# Patient Record
Sex: Female | Born: 1935 | Race: White | Hispanic: No | Marital: Married | Smoking: Never smoker
Health system: Southern US, Community
[De-identification: ages and names within clinical notes are randomized; demographics above are authoritative.]

## PROBLEM LIST (undated history)

## (undated) DIAGNOSIS — I10 Essential (primary) hypertension: Secondary | ICD-10-CM

## (undated) DIAGNOSIS — E119 Type 2 diabetes mellitus without complications: Secondary | ICD-10-CM

## (undated) DIAGNOSIS — M1990 Unspecified osteoarthritis, unspecified site: Secondary | ICD-10-CM

## (undated) DIAGNOSIS — K219 Gastro-esophageal reflux disease without esophagitis: Secondary | ICD-10-CM

## (undated) DIAGNOSIS — C50919 Malignant neoplasm of unspecified site of unspecified female breast: Secondary | ICD-10-CM

## (undated) DIAGNOSIS — IMO0002 Reserved for concepts with insufficient information to code with codable children: Secondary | ICD-10-CM

## (undated) HISTORY — PX: EYE SURGERY: SHX253

## (undated) HISTORY — PX: JOINT REPLACEMENT: SHX530

## (undated) HISTORY — DX: Type 2 diabetes mellitus without complications: E11.9

## (undated) HISTORY — PX: FOOT ARTHRODESIS, MODIFIED MCBRIDE: SUR52

## (undated) HISTORY — PX: OTHER SURGICAL HISTORY: SHX169

## (undated) HISTORY — PX: FRACTURE SURGERY: SHX138

## (undated) HISTORY — DX: Reserved for concepts with insufficient information to code with codable children: IMO0002

## (undated) HISTORY — DX: Unspecified osteoarthritis, unspecified site: M19.90

## (undated) HISTORY — DX: Essential (primary) hypertension: I10

## (undated) HISTORY — PX: APPENDECTOMY: SHX54

## (undated) HISTORY — DX: Malignant neoplasm of unspecified site of unspecified female breast: C50.919

---

## 1991-05-27 HISTORY — PX: BREAST SURGERY: SHX581

## 1995-05-27 HISTORY — PX: BREAST SURGERY: SHX581

## 1995-08-04 DIAGNOSIS — C50919 Malignant neoplasm of unspecified site of unspecified female breast: Secondary | ICD-10-CM

## 1995-08-04 HISTORY — DX: Malignant neoplasm of unspecified site of unspecified female breast: C50.919

## 1996-05-26 HISTORY — PX: PARTIAL HIP ARTHROPLASTY: SHX733

## 1996-05-26 HISTORY — PX: BREAST SURGERY: SHX581

## 1997-09-04 ENCOUNTER — Other Ambulatory Visit: Admission: RE | Admit: 1997-09-04 | Discharge: 1997-09-04 | Payer: Self-pay | Admitting: Oncology

## 1998-03-02 ENCOUNTER — Other Ambulatory Visit: Admission: RE | Admit: 1998-03-02 | Discharge: 1998-03-02 | Payer: Self-pay | Admitting: Internal Medicine

## 1998-07-02 ENCOUNTER — Encounter: Admission: RE | Admit: 1998-07-02 | Discharge: 1998-09-30 | Payer: Self-pay | Admitting: Internal Medicine

## 1999-06-05 ENCOUNTER — Other Ambulatory Visit: Admission: RE | Admit: 1999-06-05 | Discharge: 1999-06-05 | Payer: Self-pay | Admitting: Internal Medicine

## 2000-09-16 ENCOUNTER — Other Ambulatory Visit: Admission: RE | Admit: 2000-09-16 | Discharge: 2000-09-16 | Payer: Self-pay | Admitting: Internal Medicine

## 2004-03-04 ENCOUNTER — Inpatient Hospital Stay (HOSPITAL_COMMUNITY): Admission: RE | Admit: 2004-03-04 | Discharge: 2004-03-08 | Payer: Self-pay | Admitting: Orthopedic Surgery

## 2004-03-08 ENCOUNTER — Inpatient Hospital Stay
Admission: RE | Admit: 2004-03-08 | Discharge: 2004-03-14 | Payer: Self-pay | Admitting: Physical Medicine & Rehabilitation

## 2004-03-08 ENCOUNTER — Ambulatory Visit: Payer: Self-pay | Admitting: Physical Medicine & Rehabilitation

## 2006-06-09 ENCOUNTER — Emergency Department (HOSPITAL_COMMUNITY): Admission: EM | Admit: 2006-06-09 | Discharge: 2006-06-09 | Payer: Self-pay | Admitting: Emergency Medicine

## 2006-06-16 ENCOUNTER — Encounter: Admission: RE | Admit: 2006-06-16 | Discharge: 2006-06-16 | Payer: Self-pay | Admitting: Orthopaedic Surgery

## 2006-06-17 ENCOUNTER — Ambulatory Visit (HOSPITAL_BASED_OUTPATIENT_CLINIC_OR_DEPARTMENT_OTHER): Admission: RE | Admit: 2006-06-17 | Discharge: 2006-06-17 | Payer: Self-pay | Admitting: Orthopaedic Surgery

## 2007-04-05 ENCOUNTER — Encounter: Admission: RE | Admit: 2007-04-05 | Discharge: 2007-04-05 | Payer: Self-pay | Admitting: Gastroenterology

## 2009-02-18 ENCOUNTER — Emergency Department (HOSPITAL_COMMUNITY): Admission: EM | Admit: 2009-02-18 | Discharge: 2009-02-18 | Payer: Self-pay | Admitting: Family Medicine

## 2010-10-11 NOTE — Op Note (Signed)
NAMEDARTHULA, DESA NO.:  [id number]   MEDICAL RECORD NO.:  [id number]          PATIENT TYPE:  INP   LOCATION:  5025                         FACILITY:  [facility]   PHYSICIAN:  Mila Homer. Sherlean Foot, M.D. DATE OF BIRTH:  10/15/1929   DATE OF PROCEDURE:  03/04/2004  DATE OF DISCHARGE:                                 OPERATIVE REPORT   PREOPERATIVE DIAGNOSIS:  Right knee osteoarthritis.   POSTOPERATIVE DIAGNOSIS:  Right knee osteoarthritis.   OPERATION PERFORMED:  Right total knee arthroplasty.   SURGEON:  Mila Homer. Sherlean Foot, M.D.   ASSISTANT:  Jamelle Rushing, P.A.   ANESTHESIA:  General.   INDICATIONS FOR PROCEDURE:  The patient is a 77 year old white female with  failure of conservative measures for osteoarthritis of the knee.  Informed  consent was obtained.   DESCRIPTION OF PROCEDURE:  The patient was laid supine and administered  general anesthesia.  The right lower extremity was prepped and draped in the  usual sterile fashion.  A #10 blade was used to make a standard midline  incision approximately 10 cm in length.  A new 10 blade was used to make a  straight medial and parapatellar arthrotomy and perform a synovectomy in the  suprapatellar pouch.  A fresh 10 blade was used to elevate the deep medial  collateral ligament off the medial crest of the tibia really only going  about 2 cm distally since this was a valgus knee.  I then easily everted the  patella.  It measured 23 mm thick.  I then measured the patella at 24 mm.  I  reamed down to 15.5 mm.  I then used a 32 mm template to drill three lug  holes.  Then with a prosthetic trial in place, I recreated the 25 mm thick  patella.  I left the knee cap everted and removed the prosthetic trial and  went into flexion.  I cut the ACL and PCL and subluxed the tibia anteriorly.  I then used the extramedullary alignment device to remove a perpendicular  cut of bone perpendicular to the anatomic axis of the tibia  removing 2 mm of  bone off the lateral side since it was the low side.  I removed the cut  surface of the bone as well as well as the extramedullary guide.  I made an  intramedullary drill hole in the femur, placed the intramedullary device set  on 4 degree valgus cut.  I then placed the distal femoral cutting block in  place, pinned it and made the distal femoral cut with a sagittal saw.  I  then placed a 10 mm spacer block in the knee.  It was a little tight  laterally, so I did a little bit of lateral releasing to gain medial and  lateral balance.  I then placed a scoop retractor underneath the femur,  finished the femur with a size D finishing block.  I finished the tibia with  a size 4 tibial tray.  I put a lamina spreader in the knee to remove the  ACL, PCL, medial and lateral menisci  and posterior condylar osteophytes.  At  this point I trialed with a size D femur, size 4 tibia, size 10 insert and a  size 32 patella.  I then removed the trial components and copiously  irrigated.  At this point I cemented in a size 4 tibia first, D femur second  and removed all excess cement.  I snapped in a 10 mm spacer block and  located the knee in extension.  I then cemented in the size 32 patella and  let the cement harden in extension.  I then let the tourniquet down at 44  minutes and cauterized all bleeding vessels.  I then left the Hemovac coming  out superolaterally deep to the arthrotomy.  I closed the arthrotomy with  interrupted #1 Vicryl sutures.  Closed the deep soft tissues with  interrupted 0 Vicryls, then the subcuticular with 2-0 Vicryl.  I then closed  with skin staples.  I dressed with Xeroform dressing sponges, sterile Webril  and TED stocking.   COMPLICATIONS:  None.   DRAINS:  One Hemovac.   ESTIMATED BLOOD LOSS:  300 mL.       SDL/MEDQ  D:  03/04/2004  T:  03/05/2004  Job:  [id number]

## 2010-10-11 NOTE — Op Note (Signed)
NAME:  Diane Stevenson, Diane Stevenson NO.:  [id number]   MEDICAL RECORD NO.:  [id number]          PATIENT TYPE:  EMS   LOCATION:  MAJO                         FACILITY:  [facility]   PHYSICIAN:  Dyke Brackett, M.D.    DATE OF BIRTH:  03-Sep-1929   DATE OF PROCEDURE:  06/09/2006  DATE OF DISCHARGE:                               OPERATIVE REPORT   INDICATIONS FOR PROCEDURE:  The patient is a 77 year old female who  fell, injuring her left wrist, with a severely-displaced comminuted  intra-articular fracture with a nondisplaced avulsion of her patella,  thought to be amenable to closed reduction.   DESCRIPTION OF PROCEDURE:  After IV sedation, conscious sedation with  oxygen and monitoring, hematoma block, the fracture was manipulated with  a great deal of improvement on the post-reduction film, noting a slight  shortening, but anatomic restoration of the articular cartilage line on  the AP and a great deal of improvement on the lateral.  It was not clear  this would suffice permanently.  This was a great deal improved.  The  patient was advised to ice and elevate, weightbearing as tolerated in a  knee immobilizer for her patellar fracture, with a followup visit to be  scheduled early next week.  Given Percocet 5 mg 1 p.o. q. 4-6 hours for  pain.      Dyke Brackett, M.D.  Electronically Signed     WDC/MEDQ  D:  06/09/2006  T:  06/10/2006  Job:  [id number]

## 2010-10-11 NOTE — Discharge Summary (Signed)
Diane Stevenson, Diane Stevenson NO.:  [id number]   MEDICAL RECORD NO.:  [id number]          PATIENT TYPE:  ORB   LOCATION:  4526                         FACILITY:  [facility]   PHYSICIAN:  Erick Colace, M.D.DATE OF BIRTH:  1930-01-22   DATE OF ADMISSION:  03/08/2004  DATE OF DISCHARGE:  03/14/2004                                 DISCHARGE SUMMARY   DISCHARGE DIAGNOSES:  1.  Right total knee replacement.  2.  Presyncope symptoms, resolved.  3.  Hypertension, controlled off medications.  4.  Postoperative anemia.   HISTORY OF PRESENT ILLNESS:  Diane Stevenson is a 77 year old female with a  history of hypertension, diabetes mellitus type 2, osteoarthritis right knee  with collapse of lateral compartment and right knee pain.  She elected to  undergo right total knee replacement March 04, 2004, by Dr. Sherlean Foot.  Postoperatively is weightbearing as tolerated and on subcutaneous Lovenox  for DVT prophylaxis.  She was noted to have presyncopal episodes past  surgery and was transferred two units of packed red blood cells for  postoperative anemia.  She does continue with dizziness and blood pressure  medications were placed on hold.  Therapies were initiated and the patient  currently at minimal to moderate assist for transfers, minimal to moderate  assist to ambulate 5 to 12 feet.   PAST MEDICAL HISTORY:  1.  Hypertension.  2.  Diabetes mellitus type 2.  3.  Appendectomy.  4.  Left foot bunion surgery.  5.  Breast cyst.  6.  Left modified radical mastectomy for cancer.  7.  Left total hip replacement.   ALLERGIES:  1.  SULFA.  2.  MACRODANTIN.   FAMILY HISTORY:  Positive for coronary artery disease and diabetes mellitus.   SOCIAL HISTORY:  The patient is married.  Lives in one level home with six  steps at entry.  Was independent prior to admission.  She does not use any  tobacco or alcohol.   HOSPITAL COURSE:  Diane Stevenson was admitted to subacute on March 08, 2004, for SACU level therapies to consist of PT and OT daily. Past  admission, she was maintained on subcutaneous Lovenox for DVT prophylaxis  and supplement was continued for postoperative anemia.  Follow-up CBC  revealed H&H to be stable with hemoglobin 8.8, hematocrit 25.6, white count  6.0, platelets 377.  Check of lytes reveal sodium 136, potassium 4.0,  chloride 101, CO2 29, BUN 7, creatinine 0.7, glucose 143.  The patient's  UA/UC checked past admission showed no growth.  The patient's blood pressure  medications were held throughout his stay secondary to problems with  dizziness with mobility initially, and abdominal bind and thigh high TEDs  were ordered to help with blood pressure support.  At the time of discharge,  blood pressures were running from 130s/70s systolic with heart rates in 90s  to 105 range.  Patient without any cardiac symptoms.  CBGs were monitored on  a.c. and h.s. basis with decrease to b.i.d. basis and blood sugars were  noted to be stable between 110s and occasional high in the  160s.  Weight at  the time of discharge is at 155 pounds.  The patient's knee incision was  noted to be healing well without any signs or symptoms of infection.  No  drainage and no erythema noted.  Staples remained intact at the time of  discharge with the patient to have home health nurse to discontinue staples  on March 18, 2004.  The patient continues on subcutaneous Lovenox for 14  days total, DVT prophylaxis past discharge.  At the time of discharge the  patient was noted to be at modified independent level for transfers,  modified independent level for ambulating 100 feet with rolling walker.  She  was at modified independent level for ADLs.  Further follow-up therapies to  include home health PT and OT per [facility] with home  health RN to assist with Lovenox administration.  On March 14, 2004, the  patient is discharged to home.   DISCHARGE  MEDICATIONS:  1.  Glucophage 1000 mg b.i.d.  2.  Avandia 4 mg a day.  3.  Trinsicon one p.o. b.i.d.  4.  Oxycodone IR 5 to 10 mg q.4-6h. p.r.n. pain.  5.  Calcium supplements t.i.d.  6.  Lovenox 40 mg subcutaneous once a day for five days.  7.  Enteric coated aspirin 325 mg a day for one month then discontinue to 81      mg a day.   ACTIVITY:  Use walker.   DIET:  Diabetic restrictions.  Drink plenty of fluids.   WOUND CARE:  Wash with soap and water.  Keep clean and dry.   SPECIAL INSTRUCTIONS:  No alcohol.  No smoking.  No driving.  Do not use  Norvasc, Altace or hydrochlorothiazide for now.   FOLLOW UP:  The patient to follow up with Dr. Kirby Funk for recheck of  blood pressure, to follow up with Dr. Sherlean Foot for postoperative check, to  follow up with Dr. Wynn Banker as needed.      Diane Stevenson   PP/MEDQ  D:  05/06/2004  T:  05/06/2004  Job:  [id number]   cc:   Thora Lance, M.D.  [address]  [place]  [place] [address]  Fax: [phone]   Mila Homer. Sherlean Foot, M.D.  [address] [place]  [place] [address]  Fax: [phone]

## 2010-10-11 NOTE — Discharge Summary (Signed)
Diane Stevenson, Diane Stevenson NO.:  [id number]   MEDICAL RECORD NO.:  [id number]          PATIENT TYPE:  INP   LOCATION:  5025                         FACILITY:  [facility]   PHYSICIAN:  Mila Homer. Sherlean Foot, M.D. DATE OF BIRTH:  01-Jul-1929   DATE OF ADMISSION:  03/04/2004  DATE OF DISCHARGE:  03/08/2004                                 DISCHARGE SUMMARY   ADMITTING DIAGNOSES:  1.  End-stage osteoarthritis right knee with lateral compartment collapse.  2.  Hypertension.  3.  Diabetes mellitus.  4.  History of left total hip arthroplasty.  5.  History of breast cancer status post left partial mastectomy.   DISCHARGE DIAGNOSES:  1.  End-stage osteoarthritis right knee status post right total knee      arthroplasty.  2.  Acute blood loss anemia secondary to surgery.  3.  Postoperative nausea now resolved.  4.  Mild hypokalemia.  5.  Mild hyponatremia.  6.  Mild orthostatic hypotension now resolved.  7.  Hypertension.  8.  Diabetes mellitus.  9.  History of breast cancer status post left partial mastectomy.   SURGICAL PROCEDURE:  On March 04, 2004 Diane Stevenson underwent a right total  knee arthroplasty by Dr. Mila Homer. Lucey assisted by Arlyn Leak, P.A.-C.  She had a NexGen Legacy knee LPS Flex articular surface size CD 10 mm height  with a NexGen all poly patella, standard size 32 mm diameter 8.5 mm  thickness.  A NexGen fluted stem tibial component size 4 with a NexGen  Legacy knee LPS Flex femoral component size D right.   COMPLICATIONS:  None.   CONSULTS:  1.  Physical therapy and occupational therapy consults March 06, 2004  2.  Rehabilitation medicine consult March 07, 2004 in addition to case      management consult   HISTORY OF PRESENT ILLNESS:  This 77 year old white female patient presented  to Dr. Sherlean Foot with right knee pain for the last two to three years.  The knee  now feels like it gives way and pops.  It awakens her at night.  It is an  aching  sensation over the lateral aspect of the knee.  It is worse with  walking and decreases with rest.  She has failed conservative treatment and  x-rays show arthritic changes of the knee.  Because of this she is  presenting for a right knee replacement.   HOSPITAL COURSE:  Diane Stevenson tolerated her surgical procedure well without  immediate postoperative complications.  She was transferred to 5000.  Postoperative day one she was having some nausea.  Medications were  adjusted.  She is afebrile, vitals stable.  Hemoglobin 9.2, hematocrit 25.8,  and leg neurovascularly intact.  She was started on therapy.   Postoperative day two she had difficulty with being a little dizzy with out  of bed.  Hemoglobin had dropped to 8.3 with hematocrit of 23.3 and she was  subsequently transfused with 2 units of packed red blood cells.  T-max was  101.2.  Vitals otherwise stable.  Right leg neurovascularly intact.  Potassium was 3 and that was  supplemented with potassium.   She made good slow progress over the next several days.  She did better  after her transfusion.  She still had some residual dizziness on the 13th  but that improved on the 14th.  She had some mild orthostatic changes when  out of bed with therapy.  It was felt she would benefit from a course of  rehabilitation and an on March 08, 2004 a rehabilitation bed became  available and she was able to be transferred there later that day.   DISCHARGE INSTRUCTIONS:   DIET:  She can continue her current hospitalization diet with adjustments to  be made per the rehabilitation physicians.   MEDICATIONS:  Continue her current hospitalization medications with  adjustments to be made per the rehabilitation physicians.   ACTIVITY:  She is to be out of bed weightbearing as tolerated on the right  leg with use of a walker.  She is to use CPM 0-100 degrees six to eight  hours a day.  Continue Pt and OT per rehabilitation protocols.   WOUND CARE:   Please clean the right knee incision with Betadine once a day  and apply a dry dressing.  Staples can be removed with Steri-Strips with  Benzoin applied on postoperative day 14 if she is still in [place] or  rehabilitation.  If not, she needs to follow up with Dr. Sherlean Foot at that time.  Please notify Dr. Sherlean Foot if temperature greater than 101.5, chills, pain  unrelieved by pain medications, or foul smelling drainage from the wound.   FOLLOWUP:  She needs to follow up with Dr. Sherlean Foot in our office approximately  postoperative day 14 if she is out of rehabilitation at that time and  staples are still in place.  Otherwise, a week or two after discharge from  rehabilitation she can follow up with Dr. Sherlean Foot.  She needs to call [phone]  for that appointment.   LABORATORY DATA:  On October 11 hemoglobin 9.2, hematocrit 25.8.  On the  12th white count 11.2, hemoglobin 8.3, hematocrit 23.3.  On October 13 white  count 10.3, hemoglobin 10.2, hematocrit 28, platelets 198.   On October 5 glucose 109.  On the 11th sodium 128, potassium 3.7, glucose  124, calcium 7.9.  On the 12th sodium 126, potassium 3, chloride 94, glucose  140, calcium 8.2.  On the 13th sodium 130, potassium 3.8, chloride 94,  glucose 167.  On October 14 sodium 134, potassium 3.8, chloride 101, CO2 28,  glucose 117, BUN 7, creatinine 0.8, calcium 8.1.  Urinalysis showed small  amount of leukocyte esterase, rare epithelials, 0-2 white cells, no red  cells.  All other laboratory studies were within normal limits.       KED/MEDQ  D:  04/01/2004  T:  04/01/2004  Job:  [id number]   cc:   Thora Lance, M.D.  [address]  [place]  [place] [address]  Fax: [phone]

## 2010-10-11 NOTE — H&P (Signed)
Diane Stevenson, Diane Stevenson NO.:  [id number]   MEDICAL RECORD NO.:  [id number]          PATIENT TYPE:  INP   LOCATION:  NA                           FACILITY:  [facility]   PHYSICIAN:  Mila Homer. Sherlean Foot, M.D. DATE OF BIRTH:  16-Jun-1929   DATE OF ADMISSION:  DATE OF DISCHARGE:                                HISTORY & PHYSICAL   CHIEF COMPLAINT:  Right knee pain.   HISTORY OF PRESENT ILLNESS:  [name] is a 77 year old white female with  right knee pain for approximately two to three years.  Her pain, however,  became progressively worse over the past four months.  She now has the  sensation of knee giving way and also painful popping.  The knee pain does  awaken her at night.  The pain is described as an achy pain in the lateral  aspect of her knee that causes her nausea.  There is no radiation of the  pain up or down the leg.  She uses no assisted devices to ambulate.  Her  pain is worse with walking.  It is also aggravated with going up or down  stairs.  Her pain is better with rest.  She currently takes Tylenol which  helps eliminate her pain; however, it is does not completely alleviate her  pain.  She has taken Celebrex in the past; however, this causes her nausea  and vomiting.  She has had no injections by Dr. Mila Homer. Lucey.  However,  she has had injections by her primary care physician in the past and these  gave her relief for a number of years.   X-rays of her left knee reveal total collapse of the lateral compartment  with valgus deformity.   ALLERGIES:  Questionable AZO GANTRISIN, questionable MACROBID capsules,  SULFA which causes hives.   CURRENT MEDICATIONS:  1.  HCTZ 25 mg 1/2 tablet q.d.  2.  Aspirin 81 mg 1 q.d.  3.  Altace 10 mg 1q.d.  4.  Avandia 4 mg 1 q.d.  5.  Norvasc 5 mg 1 q.d.  6.  Metformin 1000 mg 1 b.i.d.  7.  Calcium plus D 500 mg b.i.d.   PAST MEDICAL HISTORY:  Positive for diabetes mellitus and hypertension.   PAST SURGICAL  HISTORY:  1.  Appendectomy in 1950.  2.  Left foot bunion removal.  3.  History of cyst left breast 1991 and right breast 1994.  4.  Left partial mastectomy in 1970 to breast cancer.  5.  Total hip replacement by Dr. Jerolyn Shin. [place]ender in 1998.  The patient      describes no complications except for nausea and vomiting of the above      procedures.  She required no blood transfusions.   SOCIAL HISTORY:  The patient denies any tobacco or alcohol use.  She is  married and has one grown children.  The patient lives in a one-story home  with six steps to each entrance.   PRIMARY CARE PHYSICIAN:  Thora Lance, M.D.  Phone number is [phone].   FAMILY HISTORY:  The patient's mother  is deceased at age 10 due to heart  conditions.  Otherwise health unknown.  Father deceased at approximate age  71.  He had a history of pneumonia, otherwise health history unknown.  She  has one living brother who has diabetes mellitus and is age 36.  She has  three living sisters-age 77, 62, and 60.  Unsure of their health problems.  She has four deceased brothers-one due to liver disease Adenosine-Cardiolite  the other three due to MIs.  She has one deceased sister-demise is unknown  and age at which death occurred unknown.   REVIEW OF SYMPTOMS:  The patient denies any recent cold, cough, fever, or  flu-like symptoms.  She denies any chest pain, shortness of breath, PND, or  orthopnea.  She denies any GI or GU conditions.  She denies any hepatic  hematologic conditions.  She wears glasses.   PHYSICAL EXAMINATION:  GENERAL:  The patient is a well-developed, well-  nourished female.  She has an obvious valgus deformity of the left knee.  The patient mood and affect are appropriate.  She talks easily with the  examiner.  VITAL SIGNS:  The patient's height is 5 foot 5 inches.  Weight 156 pounds.  Temperature is 96.8, pulse 70, respiratory rate 16, blood pressure 132/72.  CARDIOVASCULAR:  Regular rate and  rhythm.  No murmurs, rubs, or gallops  noted.  LUNGS:  Clear to auscultation bilaterally without rales or wheezing.  ABDOMEN:  Nontender to palpation.  Bowel sounds x 4 quadrants.  No  hepatosplenomegaly or splenomegaly is noted.  HEENT:  Normocephalic and atraumatic without frontal or maxillary sinus  tenderness to palpation.  Conjunctivae are pink.  Sclerae has nonicteric.  PERRLA.  Extraocular movements intact.  There are no visible external ear  deformities.  TMs are pearly and gray bilaterally.  Nose shows nasal septum  is midline.  Nasal mucosa is pink and moist without polyps.  Buccal mucosa  is pink and moist.  Good dentition.  Pharynx without erythema or exudate.  Tongue and uvula is midline.  NECK:  Trachea is midline.  No lymphadenopathy.  Carotids are 2+ without  bruits.  No tenderness along the cervical spine.  She has full range of  motion of the cervical spine without pain.  BACK:  Nontender to palpation over the thoracic lumbar spine.  BREASTS:  All deferred at this time.  GENITOURINARY:  All deferred at this time.  RECTAL:  All deferred at this time.  NEUROLOGICAL:  The patient is alert and oriented x 3.  Cranial nerves II  through XII are grossly intact.  Deep tendon reflexes are 2+ bilaterally in  the upper and lower extremities.  Strength testing in the upper and lower  extremities revealed 5/5 strength throughout.  MUSCULOSKELETAL:  Left arm is slightly edematous compared to the right,  especially in the shoulder and humerus region.  Otherwise, the arms are  equal and symmetric in size and shape.  She has full range of motion of the  shoulders, elbows, and hands.  Right wrist shows a slight deformity along  the ulnar aspect due to a previous fracture.  Radial pulses are 2+  bilaterally.  Motor extremities are equal in size and shape.  Bilateral hips  show full range of motion.  Right knee full flexion and extension which measures 0 to 130 degrees of flexion.  There  is 7 degree of valgus deformity  of the knee.  Has tenderness with palpation of the medial compartment,  otherwise the knee is nontender.  There is no definite fusion.  No edema.  Anterior draws are negative.  Negative left knee at 0 to 130 degrees. Valgus  varus stressing reveals no laxity.  Anterior draw is negative.  There is no  evidence of fusion or edema.  Lower extremities are nonedematous  bilaterally.  Dorsalis pedis pulses are 2+ bilaterally.  She has full  dorsiflexion and plantar flexion of the feet.  She has good sensation to  light touch in the toes.   LABORATORY DATA:  X-rays show four views of the right knee reveal total  collapse of the lateral compartment with a valgus deformity.   IMPRESSION:  1.  Osteoarthritis right knee with lateral compartment collapse.  2.  History of left total hip arthroplasty October of 1998.  3.  Hypertension.  4.  Diabetes mellitus.   PLAN:  The patient is to be admitted to [facility] on  March 04, 2004 to undergo a right total knee arthroplasty.  The patient  will undergo all preoperative testing and labs prior to surgery.       GC/MEDQ  D:  02/26/2004  T:  02/26/2004  Job:  [id number]

## 2010-10-11 NOTE — Op Note (Signed)
NAMENUSRAT, ENCARNACION NO.:  [id number]   MEDICAL RECORD NO.:  [id number]          PATIENT TYPE:  AMB   LOCATION:  DSC                          FACILITY:  [facility]   PHYSICIAN:  Claude Manges. Whitfield, M.D.DATE OF BIRTH:  12-02-1929   DATE OF PROCEDURE:  06/17/2006  DATE OF DISCHARGE:                               OPERATIVE REPORT   PREOPERATIVE DIAGNOSIS:  Displaced left distal radius fracture.   POSTOPERATIVE DIAGNOSIS:  Displaced left distal radius fracture.   PROCEDURE:  Open reduction internal fixation.   SURGEON:  Claude Manges. Cleophas Dunker, M.D.   ASSISTANT:  Arlys John D. Petrarca, P.A.-C.   ANESTHESIA:  Regional block with IV sedation.   COMPLICATIONS:  None.   HISTORY:  A 77 year old female fell 8 days ago sustaining a distal  radius fracture.  This was initially reduced in the emergency room with  nice reduction and position of the fragments.  Over the past week, she  has had significant shortening and displacement of the fracture and is  now to have an open reduction internal fixation.   PROCEDURE:  The patient comfortable on the operating room under IV  sedation.  A tourniquet was applied to the left upper extremity.  The  arm was then prepped with DuraPrep from the tips of the fingers to the  elbow.  Sterile draping was performed.  With the extremity still  elevated, it was Esmarch exsanguinated with the proximal tourniquet at  250 mmHg.  The patient did have preoperative regional block by  Anesthesia.   An incision was outlined along the flexor carpi radialis tendon crossing  the wrist joint at a 60-degree angle.  The 8-cm incision was then  carefully incised sharply and then by blunt dissection, soft tissue was  elevated off the palmar sheath of the flexor carpi radialis.  This was  carefully incised.  The tendon was retracted ulnarly.  The base of the  sheath was then opened.  Tendinous structures were then carefully  retracted ulnarly, so that we could  visualize the pronator quadratus.  This was incised along the watershed line of the distal radius radially  and then along the first extensor compartment and then elevated ulnarly,  thus exposing the distal radius.  It was then irrigated.  The radius was  fractured approximately 2 cm proximal to the wrist joint.  It was  shortened and there was comminution.  Under direct visualization, the  fracture was then reduced with traction and ulnar deviation.  The [employer] plate was then applied.  An oblong hole was then drilled and  filled with a 14-mm screw.  The plate was still a little ulnar, so we  repositioned it with excellent position and the 14 screw was too long so  we inserted a 12.  The distal fragment was then maintained to the distal  plate with a K wire.  We carefully drilled, measured, and filled the  proximal row with threaded screws and checked on image intensification  to be sure that we were not in the joint and that we were not too long.  In similar  fashion, the distal row were then drilled, measured, and  filled with the smooth pegs.  Image intensification revealed excellent  position of the fracture and the internal fixation devices without  position into the joint or too long dorsally.  The remaining holes in  the plate proximally were then drilled, measured, and filled with self-  tapping screws and again we checked the final position with excellent  position.  Clinically there was no motion at the fracture site.  We  established length and angulation of the distal radius.   The wound was then copiously irrigated with saline solution.  The  pronator quadratus was then reapproximated with 3-0 Vicryl, subcutaneous  with the same material, and the skin closed with nylon.  Sterile bulky  dressing was applied, followed by posterior splints and a bulky hand  dressing.  Tourniquet was released with immediate capillary refill to  the fingers.   PLAN:  Office one week.   Did receive a gram of Ancef preoperatively.  We  will plan on giving her prescription for Vicodin for pain.      Claude Manges. Cleophas Dunker, M.D.  Electronically Signed     PWW/MEDQ  D:  06/17/2006  T:  06/17/2006  Job:  [id number]

## 2010-10-23 ENCOUNTER — Encounter (INDEPENDENT_AMBULATORY_CARE_PROVIDER_SITE_OTHER): Payer: Self-pay | Admitting: General Surgery

## 2011-07-22 ENCOUNTER — Encounter (INDEPENDENT_AMBULATORY_CARE_PROVIDER_SITE_OTHER): Payer: Self-pay | Admitting: General Surgery

## 2011-07-23 ENCOUNTER — Encounter (INDEPENDENT_AMBULATORY_CARE_PROVIDER_SITE_OTHER): Payer: Self-pay | Admitting: General Surgery

## 2011-07-23 ENCOUNTER — Ambulatory Visit (INDEPENDENT_AMBULATORY_CARE_PROVIDER_SITE_OTHER): Payer: Medicare Other | Admitting: General Surgery

## 2011-07-23 VITALS — BP 130/70 | HR 68 | Temp 96.9°F | Resp 16 | Ht 65.0 in | Wt 153.2 lb

## 2011-07-23 DIAGNOSIS — Z853 Personal history of malignant neoplasm of breast: Secondary | ICD-10-CM | POA: Insufficient documentation

## 2011-07-23 NOTE — Patient Instructions (Signed)
Continue regular self exams  

## 2011-07-23 NOTE — Progress Notes (Signed)
Subjective:     Patient ID: Diane Stevenson, female   DOB: 12-15-1929, 78 y.o.   MRN: [id number]  HPI The patient is a 78 year old white female who is 15 years out from a left breast lumpectomy and axillary node dissection for a stage III breast cancer. She finished chemoradiation. Her recent mammogram showed no evidence of malignancy. Since her last visit her only complaint is right hip pain. She denies any breast pain or discharge from her nipple  Review of Systems  Constitutional: Negative.   HENT: Negative.   Eyes: Negative.   Respiratory: Negative.   Cardiovascular: Negative.   Gastrointestinal: Negative.   Genitourinary: Negative.   Musculoskeletal: Positive for arthralgias.  Skin: Negative.   Neurological: Negative.   Hematological: Negative.   Psychiatric/Behavioral: Negative.        Objective:   Physical Exam  Constitutional: She is oriented to person, place, and time. She appears well-developed and well-nourished.  HENT:  Head: Normocephalic and atraumatic.  Eyes: Conjunctivae and EOM are normal. Pupils are equal, round, and reactive to light.  Neck: Normal range of motion. Neck supple.  Cardiovascular: Normal rate, regular rhythm and normal heart sounds.   Pulmonary/Chest: Effort normal and breath sounds normal.       No palpable mass in either breast. No axillary supraclavicular or cervical lymphadenopathy  Abdominal: Soft. Bowel sounds are normal. She exhibits no mass. There is no tenderness.  Musculoskeletal: Normal range of motion.  Lymphadenopathy:    She has no cervical adenopathy.  Neurological: She is alert and oriented to person, place, and time.  Skin: Skin is warm and dry.  Psychiatric: She has a normal mood and affect. Her behavior is normal.       Assessment:     15 years status post left breast lumpectomy and axillary node dissection    Plan:     She will continue to do regular self exams. We will plan to see her back in one year

## 2011-07-24 ENCOUNTER — Encounter (INDEPENDENT_AMBULATORY_CARE_PROVIDER_SITE_OTHER): Payer: Self-pay

## 2011-08-08 ENCOUNTER — Other Ambulatory Visit: Payer: Self-pay | Admitting: Orthopedic Surgery

## 2011-08-11 ENCOUNTER — Encounter (HOSPITAL_COMMUNITY): Payer: Self-pay | Admitting: Pharmacy Technician

## 2011-08-13 ENCOUNTER — Encounter (HOSPITAL_COMMUNITY)
Admission: RE | Admit: 2011-08-13 | Discharge: 2011-08-13 | Disposition: A | Discharge status: Home / Self Care | Payer: Medicare Other | Source: Ambulatory Visit | Attending: Orthopedic Surgery | Admitting: Orthopedic Surgery

## 2011-08-13 ENCOUNTER — Encounter (HOSPITAL_COMMUNITY): Payer: Self-pay

## 2011-08-13 ENCOUNTER — Other Ambulatory Visit: Payer: Self-pay

## 2011-08-13 LAB — CBC
Hemoglobin: 11.9 g/dL — ABNORMAL LOW (ref 12.0–15.0)
RBC: 4.26 MIL/uL (ref 3.87–5.11)

## 2011-08-13 LAB — DIFFERENTIAL
Lymphocytes Relative: 24 % (ref 12–46)
Lymphs Abs: 2.3 10*3/uL (ref 0.7–4.0)
Monocytes Relative: 5 % (ref 3–12)
Neutro Abs: 6.4 10*3/uL (ref 1.7–7.7)
Neutrophils Relative %: 69 % (ref 43–77)

## 2011-08-13 LAB — PROTIME-INR: INR: 1.06 (ref 0.00–1.49)

## 2011-08-13 LAB — URINALYSIS, ROUTINE W REFLEX MICROSCOPIC
Glucose, UA: NEGATIVE mg/dL
Ketones, ur: 15 mg/dL — AB
Nitrite: NEGATIVE
Protein, ur: NEGATIVE mg/dL
Urobilinogen, UA: 0.2 mg/dL (ref 0.0–1.0)

## 2011-08-13 LAB — TYPE AND SCREEN
ABO/RH(D): A POS
Antibody Screen: NEGATIVE

## 2011-08-13 LAB — BASIC METABOLIC PANEL
Chloride: 98 mEq/L (ref 96–112)
GFR calc Af Amer: 90 mL/min — ABNORMAL LOW (ref >90)
GFR calc non Af Amer: 78 mL/min — ABNORMAL LOW (ref >90)
Potassium: 4.5 mEq/L (ref 3.5–5.1)
Sodium: 135 mEq/L (ref 135–145)

## 2011-08-13 LAB — SURGICAL PCR SCREEN
MRSA, PCR: NEGATIVE
Staphylococcus aureus: NEGATIVE

## 2011-08-13 LAB — URINE MICROSCOPIC-ADD ON

## 2011-08-13 MED ORDER — DEXTROSE-NACL 5-0.45 % IV SOLN: INTRAVENOUS | Status: DC

## 2011-08-13 MED ORDER — CHLORHEXIDINE GLUCONATE 4 % EX LIQD: 60.0000 mL | Freq: Once | CUTANEOUS | Status: DC

## 2011-08-13 NOTE — Pre-Procedure Instructions (Signed)
[address] Ced[address] Sackrider [facility]  08/13/2011   Your procedure is scheduled on:  08/20/11  Report to [facility] at 11 AM.  Call this number if you have problems the morning of surgery: [phone]   Remember:   Do not eat food:After Midnight.  May have clear liquids: up to 4 Hours before arrival.  Clear liquids include soda, tea, black coffee, apple or grape juice, broth.  Take these medicines the morning of surgery with A SIP OF WATER: *none   Do not wear jewelry, make-up or nail polish.  Do not wear lotions, powders, or perfumes. You may wear deodorant.  Do not shave 48 hours prior to surgery.  Do not bring valuables to the hospital.  Contacts, dentures or bridgework may not be worn into surgery.  Leave suitcase in the car. After surgery it may be brought to your room.  For patients admitted to the hospital, checkout time is 11:00 AM the day of discharge.   Patients discharged the day of surgery will not be allowed to drive home.  Name and phone number of your driver: family  Special Instructions: CHG Shower Use Special Wash: 1/2 bottle night before surgery and 1/2 bottle morning of surgery.   Please read over the following fact sheets that you were given: Pain Booklet, Coughing and Deep Breathing, Blood Transfusion Information, MRSA Information and Surgical Site Infection Prevention

## 2011-08-15 NOTE — H&P (Signed)
  Subjective: Diane Stevenson is an 78 year old patient who has been seen previously by Dr. Sherlean Foot for right hip pain.  She has a history of left total hip arthroplasty that was done by Dr. Tresa Res in 1998.  She reports that she has no problems with her left hip but has seen increasing pain in her right hip.  The pain began approximately 18 months ago with no known mechanism of injury and she localizes it to the groin area.  She had an intra-articular cortisone injection that provided her with 2 weeks of pain relief.  She uses aspirin for pain control and has a cane that she uses for ambulation.  At this point the pain is so severe that it interfered interferes with her daily activities.  Past Medical Hx: Diabetes, HTN, osteoporosis, dyslipidemia  Allergies: Sulfa, Macrobid, Ibuprofen  Past Surgical Hx: Appendectomy, L THA, R TKA  Family Hx: Non-contributory  Social Hx: Denies tobacco or alcohol  ROS: Patient denies dizziness, nausea, fever, chills, vomiting, shortness of breath, chest pain, loss of appetite, or rash.    PHYSICAL EXAM: Well-developed, well-nourished.  Awake, alert, and oriented x3.  Extraocular motion is intact.  No use of accessory respiratory muscles for breathing.   Cardiovascular exam reveals a regular rhythm.  Skin is intact without cuts, scrapes, or abrasions. Examination of the right hip demonstrates significant pain with internal rotation.  She also has pain with external rotation.  Foot tap is negative she is neurovascularly intact.  X-rays: 2 views of the right hip demonstrate end-stage arthritis at the medial pole of the right hip.  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  At this point, she is a candidate for total hip arthroplasty.  She would like to discuss this with her husband and will call Agustin Cree when she is ready to schedule.      Objective: Examination of the right hip  demonstrates significant pain with internal rotation.  She also has pain with external rotation.  Foot tap is negative she is neurovascularly intact.  X-rays: 2 views of the right hip demonstrate end-stage arthritis at the medial pole of the right hip.  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  At this point, she is a candidate for total hip arthroplasty.  She would like to discuss this with her husband and will call Agustin Cree when she is ready to schedule.

## 2011-08-19 MED ORDER — CEFAZOLIN SODIUM-DEXTROSE 2-3 GM-% IV SOLR
2.0000 g | INTRAVENOUS | Status: DC
  Filled 2011-08-19: qty 50

## 2011-08-20 ENCOUNTER — Encounter (HOSPITAL_COMMUNITY): Payer: Self-pay | Admitting: *Deleted

## 2011-08-20 ENCOUNTER — Inpatient Hospital Stay (HOSPITAL_COMMUNITY)
Admission: RE | Admit: 2011-08-20 | Discharge: 2011-08-24 | DRG: 470 | Disposition: A | Discharge status: Home Health | Payer: Medicare Other | Source: Ambulatory Visit | Attending: Orthopedic Surgery | Admitting: Orthopedic Surgery

## 2011-08-20 ENCOUNTER — Encounter (HOSPITAL_COMMUNITY): Payer: Self-pay | Admitting: Anesthesiology

## 2011-08-20 ENCOUNTER — Ambulatory Visit (HOSPITAL_COMMUNITY): Payer: Medicare Other

## 2011-08-20 ENCOUNTER — Ambulatory Visit (HOSPITAL_COMMUNITY): Payer: Medicare Other | Admitting: Anesthesiology

## 2011-08-20 ENCOUNTER — Encounter (HOSPITAL_COMMUNITY)
Admission: RE | Disposition: A | Discharge status: Home Health | Payer: Self-pay | Source: Ambulatory Visit | Attending: Orthopedic Surgery

## 2011-08-20 DIAGNOSIS — M810 Age-related osteoporosis without current pathological fracture: Secondary | ICD-10-CM | POA: Diagnosis present

## 2011-08-20 DIAGNOSIS — D62 Acute posthemorrhagic anemia: Secondary | ICD-10-CM

## 2011-08-20 DIAGNOSIS — Z882 Allergy status to sulfonamides status: Secondary | ICD-10-CM

## 2011-08-20 DIAGNOSIS — E119 Type 2 diabetes mellitus without complications: Secondary | ICD-10-CM | POA: Diagnosis present

## 2011-08-20 DIAGNOSIS — Z96659 Presence of unspecified artificial knee joint: Secondary | ICD-10-CM

## 2011-08-20 DIAGNOSIS — M169 Osteoarthritis of hip, unspecified: Principal | ICD-10-CM | POA: Diagnosis present

## 2011-08-20 DIAGNOSIS — Z01810 Encounter for preprocedural cardiovascular examination: Secondary | ICD-10-CM

## 2011-08-20 DIAGNOSIS — M1610 Unilateral primary osteoarthritis, unspecified hip: Principal | ICD-10-CM

## 2011-08-20 DIAGNOSIS — Z888 Allergy status to other drugs, medicaments and biological substances status: Secondary | ICD-10-CM

## 2011-08-20 DIAGNOSIS — Z853 Personal history of malignant neoplasm of breast: Secondary | ICD-10-CM

## 2011-08-20 DIAGNOSIS — Z01812 Encounter for preprocedural laboratory examination: Secondary | ICD-10-CM

## 2011-08-20 DIAGNOSIS — Z7901 Long term (current) use of anticoagulants: Secondary | ICD-10-CM

## 2011-08-20 DIAGNOSIS — Z96649 Presence of unspecified artificial hip joint: Secondary | ICD-10-CM

## 2011-08-20 DIAGNOSIS — E785 Hyperlipidemia, unspecified: Secondary | ICD-10-CM | POA: Diagnosis present

## 2011-08-20 DIAGNOSIS — Z79899 Other long term (current) drug therapy: Secondary | ICD-10-CM

## 2011-08-20 HISTORY — PX: TOTAL HIP ARTHROPLASTY: SHX124

## 2011-08-20 LAB — GLUCOSE, CAPILLARY
Glucose-Capillary: 107 mg/dL — ABNORMAL HIGH (ref 70–99)
Glucose-Capillary: 62 mg/dL — ABNORMAL LOW (ref 70–99)
Glucose-Capillary: 81 mg/dL (ref 70–99)
Glucose-Capillary: 173 mg/dL — ABNORMAL HIGH (ref 70–99)
Glucose-Capillary: 324 mg/dL — ABNORMAL HIGH (ref 70–99)

## 2011-08-20 SURGERY — ARTHROPLASTY, HIP, TOTAL,POSTERIOR APPROACH
Anesthesia: General | Site: Hip | Laterality: Right | Wound class: Clean

## 2011-08-20 MED ORDER — ACETAMINOPHEN 10 MG/ML IV SOLN
INTRAVENOUS | Status: DC | PRN
  Administered 2011-08-20: 1000 mg via INTRAVENOUS

## 2011-08-20 MED ORDER — LIDOCAINE HCL (CARDIAC) 20 MG/ML IV SOLN
INTRAVENOUS | Status: DC | PRN
  Administered 2011-08-20: 70 mg via INTRAVENOUS

## 2011-08-20 MED ORDER — MIDAZOLAM HCL 5 MG/5ML IJ SOLN
INTRAMUSCULAR | Status: DC | PRN
  Administered 2011-08-20: 2 mg via INTRAVENOUS

## 2011-08-20 MED ORDER — PATIENT'S GUIDE TO USING COUMADIN BOOK
Freq: Once | Status: DC
  Filled 2011-08-20: qty 1

## 2011-08-20 MED ORDER — ONDANSETRON HCL 4 MG PO TABS: 4.0000 mg | ORAL_TABLET | Freq: Four times a day (QID) | ORAL | Status: DC | PRN

## 2011-08-20 MED ORDER — CEFUROXIME SODIUM 1.5 G IJ SOLR
INTRAMUSCULAR | Status: DC | PRN
  Administered 2011-08-20: 1.5 g

## 2011-08-20 MED ORDER — MENTHOL 3 MG MT LOZG: 1.0000 | LOZENGE | OROMUCOSAL | Status: DC | PRN

## 2011-08-20 MED ORDER — SENNOSIDES-DOCUSATE SODIUM 8.6-50 MG PO TABS: 1.0000 | ORAL_TABLET | Freq: Every evening | ORAL | Status: DC | PRN

## 2011-08-20 MED ORDER — ROCURONIUM BROMIDE 100 MG/10ML IV SOLN
INTRAVENOUS | Status: DC | PRN
  Administered 2011-08-20: 40 mg via INTRAVENOUS

## 2011-08-20 MED ORDER — PHENOL 1.4 % MT LIQD: 1.0000 | OROMUCOSAL | Status: DC | PRN

## 2011-08-20 MED ORDER — WARFARIN - PHARMACIST DOSING INPATIENT: Freq: Every day | Status: DC

## 2011-08-20 MED ORDER — INSULIN ASPART 100 UNIT/ML ~~LOC~~ SOLN
0.0000 [IU] | Freq: Three times a day (TID) | SUBCUTANEOUS | Status: DC
  Administered 2011-08-21: 3 [IU] via SUBCUTANEOUS
  Administered 2011-08-21: 8 [IU] via SUBCUTANEOUS
  Administered 2011-08-21 – 2011-08-22 (×2): 5 [IU] via SUBCUTANEOUS
  Administered 2011-08-22 – 2011-08-23 (×4): 3 [IU] via SUBCUTANEOUS
  Administered 2011-08-23: 2 [IU] via SUBCUTANEOUS

## 2011-08-20 MED ORDER — OXYCODONE HCL 5 MG PO TABS
5.0000 mg | ORAL_TABLET | ORAL | Status: DC | PRN
  Administered 2011-08-21 – 2011-08-23 (×4): 5 mg via ORAL
  Filled 2011-08-20 (×2): qty 1
  Filled 2011-08-20: qty 2

## 2011-08-20 MED ORDER — KCL IN DEXTROSE-NACL 20-5-0.45 MEQ/L-%-% IV SOLN
INTRAVENOUS | Status: DC
  Administered 2011-08-20 – 2011-08-21 (×3): via INTRAVENOUS
  Filled 2011-08-20 (×14): qty 1000

## 2011-08-20 MED ORDER — METFORMIN HCL 500 MG PO TABS
1000.0000 mg | ORAL_TABLET | Freq: Two times a day (BID) | ORAL | Status: DC
  Administered 2011-08-21 – 2011-08-24 (×7): 1000 mg via ORAL
  Filled 2011-08-20 (×10): qty 2

## 2011-08-20 MED ORDER — ACETAMINOPHEN 650 MG RE SUPP: 650.0000 mg | Freq: Four times a day (QID) | RECTAL | Status: DC | PRN

## 2011-08-20 MED ORDER — BISACODYL 5 MG PO TBEC
5.0000 mg | DELAYED_RELEASE_TABLET | Freq: Every day | ORAL | Status: DC | PRN
  Administered 2011-08-23: 5 mg via ORAL
  Filled 2011-08-20: qty 1

## 2011-08-20 MED ORDER — METHOCARBAMOL 100 MG/ML IJ SOLN
500.0000 mg | Freq: Four times a day (QID) | INTRAVENOUS | Status: DC | PRN
  Filled 2011-08-20: qty 5

## 2011-08-20 MED ORDER — LACTATED RINGERS IV SOLN
INTRAVENOUS | Status: DC
  Administered 2011-08-20: 13:00:00 via INTRAVENOUS

## 2011-08-20 MED ORDER — METHOCARBAMOL 500 MG PO TABS
500.0000 mg | ORAL_TABLET | Freq: Four times a day (QID) | ORAL | Status: DC | PRN
  Administered 2011-08-23: 500 mg via ORAL
  Filled 2011-08-20 (×2): qty 1

## 2011-08-20 MED ORDER — ACETAMINOPHEN 325 MG PO TABS: 650.0000 mg | ORAL_TABLET | Freq: Four times a day (QID) | ORAL | Status: DC | PRN

## 2011-08-20 MED ORDER — HYDROMORPHONE HCL PF 1 MG/ML IJ SOLN
0.2500 mg | INTRAMUSCULAR | Status: DC | PRN
  Administered 2011-08-20 (×2): 0.5 mg via INTRAVENOUS

## 2011-08-20 MED ORDER — WARFARIN VIDEO
Freq: Once | Status: AC
  Administered 2011-08-20: 21:00:00
  Filled 2011-08-20: qty 1

## 2011-08-20 MED ORDER — ALUMINUM HYDROXIDE GEL 320 MG/5ML PO SUSP
15.0000 mL | ORAL | Status: DC | PRN
  Filled 2011-08-20: qty 30

## 2011-08-20 MED ORDER — WARFARIN SODIUM 5 MG PO TABS
5.0000 mg | ORAL_TABLET | Freq: Once | ORAL | Status: AC
  Administered 2011-08-20: 5 mg via ORAL
  Filled 2011-08-20: qty 1

## 2011-08-20 MED ORDER — GLIMEPIRIDE 2 MG PO TABS
2.0000 mg | ORAL_TABLET | Freq: Every day | ORAL | Status: DC
  Administered 2011-08-21 – 2011-08-24 (×4): 2 mg via ORAL
  Filled 2011-08-20 (×5): qty 1

## 2011-08-20 MED ORDER — HETASTARCH-ELECTROLYTES 6 % IV SOLN
INTRAVENOUS | Status: DC | PRN
  Administered 2011-08-20: 14:00:00 via INTRAVENOUS

## 2011-08-20 MED ORDER — ONDANSETRON HCL 4 MG/2ML IJ SOLN
INTRAMUSCULAR | Status: AC
  Filled 2011-08-20: qty 2

## 2011-08-20 MED ORDER — CYANOCOBALAMIN 500 MCG PO TABS
500.0000 ug | ORAL_TABLET | Freq: Every day | ORAL | Status: DC
  Administered 2011-08-21 – 2011-08-24 (×4): 500 ug via ORAL
  Filled 2011-08-20 (×4): qty 1

## 2011-08-20 MED ORDER — RAMIPRIL 10 MG PO CAPS
10.0000 mg | ORAL_CAPSULE | Freq: Every day | ORAL | Status: DC
  Administered 2011-08-21 – 2011-08-24 (×4): 10 mg via ORAL
  Filled 2011-08-20 (×4): qty 1

## 2011-08-20 MED ORDER — BUPIVACAINE-EPINEPHRINE 0.5% -1:200000 IJ SOLN
INTRAMUSCULAR | Status: DC | PRN
  Administered 2011-08-20: 10 mL

## 2011-08-20 MED ORDER — ACETAMINOPHEN 10 MG/ML IV SOLN
INTRAVENOUS | Status: AC
Start: 2011-08-20 — End: 2011-08-20
  Filled 2011-08-20: qty 100

## 2011-08-20 MED ORDER — DIPHENHYDRAMINE HCL 12.5 MG/5ML PO ELIX: 12.5000 mg | ORAL_SOLUTION | ORAL | Status: DC | PRN

## 2011-08-20 MED ORDER — HYDROCODONE-ACETAMINOPHEN 5-325 MG PO TABS
1.0000 | ORAL_TABLET | ORAL | Status: DC | PRN
  Filled 2011-08-20: qty 2

## 2011-08-20 MED ORDER — METFORMIN HCL 500 MG PO TABS: 1000.0000 mg | ORAL_TABLET | Freq: Two times a day (BID) | ORAL | Status: DC

## 2011-08-20 MED ORDER — METOCLOPRAMIDE HCL 10 MG PO TABS: 5.0000 mg | ORAL_TABLET | Freq: Three times a day (TID) | ORAL | Status: DC | PRN

## 2011-08-20 MED ORDER — LACTATED RINGERS IV SOLN
INTRAVENOUS | Status: DC | PRN
  Administered 2011-08-20 (×2): via INTRAVENOUS

## 2011-08-20 MED ORDER — KCL IN DEXTROSE-NACL 20-5-0.45 MEQ/L-%-% IV SOLN
INTRAVENOUS | Status: AC
  Filled 2011-08-20: qty 1000

## 2011-08-20 MED ORDER — ZOLPIDEM TARTRATE 5 MG PO TABS: 5.0000 mg | ORAL_TABLET | Freq: Every evening | ORAL | Status: DC | PRN

## 2011-08-20 MED ORDER — DEXTROSE 50 % IV SOLN
12.5000 g | Freq: Once | INTRAVENOUS | Status: AC
  Administered 2011-08-20: 12.5 g via INTRAVENOUS

## 2011-08-20 MED ORDER — HYDROMORPHONE HCL PF 1 MG/ML IJ SOLN
0.5000 mg | INTRAMUSCULAR | Status: DC | PRN
  Administered 2011-08-20 (×2): 1 mg via INTRAVENOUS
  Filled 2011-08-20 (×2): qty 1

## 2011-08-20 MED ORDER — FLEET ENEMA 7-19 GM/118ML RE ENEM: 1.0000 | ENEMA | Freq: Once | RECTAL | Status: AC | PRN

## 2011-08-20 MED ORDER — ONDANSETRON HCL 4 MG/2ML IJ SOLN
4.0000 mg | Freq: Four times a day (QID) | INTRAMUSCULAR | Status: DC | PRN
  Administered 2011-08-22: 4 mg via INTRAVENOUS
  Filled 2011-08-20: qty 2

## 2011-08-20 MED ORDER — DROPERIDOL 2.5 MG/ML IJ SOLN
INTRAMUSCULAR | Status: AC
  Administered 2011-08-20: 0.625 mg via INTRAVENOUS
  Filled 2011-08-20: qty 2

## 2011-08-20 MED ORDER — ONDANSETRON HCL 4 MG/2ML IJ SOLN: 4.0000 mg | Freq: Once | INTRAMUSCULAR | Status: DC | PRN

## 2011-08-20 MED ORDER — METOCLOPRAMIDE HCL 5 MG/ML IJ SOLN
5.0000 mg | Freq: Three times a day (TID) | INTRAMUSCULAR | Status: DC | PRN
  Administered 2011-08-22: 10 mg via INTRAVENOUS
  Filled 2011-08-20: qty 2

## 2011-08-20 MED ORDER — FENTANYL CITRATE 0.05 MG/ML IJ SOLN
INTRAMUSCULAR | Status: DC | PRN
  Administered 2011-08-20 (×4): 50 ug via INTRAVENOUS

## 2011-08-20 MED ORDER — SODIUM CHLORIDE 0.9 % IR SOLN
Status: DC | PRN
  Administered 2011-08-20: 1000 mL

## 2011-08-20 MED ORDER — PROPOFOL 10 MG/ML IV EMUL
INTRAVENOUS | Status: DC | PRN
  Administered 2011-08-20: 150 mg via INTRAVENOUS

## 2011-08-20 MED ORDER — PHENYLEPHRINE HCL 10 MG/ML IJ SOLN
INTRAMUSCULAR | Status: DC | PRN
  Administered 2011-08-20 (×2): 80 ug via INTRAVENOUS
  Administered 2011-08-20: 40 ug via INTRAVENOUS
  Administered 2011-08-20: 80 ug via INTRAVENOUS
  Administered 2011-08-20: 40 ug via INTRAVENOUS
  Administered 2011-08-20: 80 ug via INTRAVENOUS

## 2011-08-20 MED ORDER — DROPERIDOL 2.5 MG/ML IJ SOLN
0.6250 mg | Freq: Once | INTRAMUSCULAR | Status: DC
  Administered 2011-08-20: 0.625 mg via INTRAVENOUS

## 2011-08-20 SURGICAL SUPPLY — 55 items
BLADE SAW SAG 73X25 THK (BLADE) ×1
BLADE SAW SGTL 18X1.27X75 (BLADE) IMPLANT
BLADE SAW SGTL 73X25 THK (BLADE) ×1 IMPLANT
BLADE SAW SGTL MED 73X18.5 STR (BLADE) IMPLANT
BRUSH FEMORAL CANAL (MISCELLANEOUS) ×1 IMPLANT
CEMENT BONE DEPUY (Cement) ×2 IMPLANT
CEMENT RESTRICTOR DEPUY SZ 3 (Cement) ×1 IMPLANT
CLOTH BEACON ORANGE TIMEOUT ST (SAFETY) ×2 IMPLANT
COVER BACK TABLE 24X17X13 BIG (DRAPES) ×1 IMPLANT
COVER SURGICAL LIGHT HANDLE (MISCELLANEOUS) ×4 IMPLANT
DRAPE ORTHO SPLIT 77X108 STRL (DRAPES) ×2
DRAPE PROXIMA HALF (DRAPES) ×2 IMPLANT
DRAPE SURG ORHT 6 SPLT 77X108 (DRAPES) ×1 IMPLANT
DRAPE U-SHAPE 47X51 STRL (DRAPES) ×2 IMPLANT
DRILL BIT 7/64X5 (BIT) ×2 IMPLANT
DRSG MEPILEX BORDER 4X12 (GAUZE/BANDAGES/DRESSINGS) ×2 IMPLANT
DRSG MEPILEX BORDER 4X8 (GAUZE/BANDAGES/DRESSINGS) ×2 IMPLANT
DURAPREP 26ML APPLICATOR (WOUND CARE) ×2 IMPLANT
ELECT BLADE 4.0 EZ CLEAN MEGAD (MISCELLANEOUS)
ELECT REM PT RETURN 9FT ADLT (ELECTROSURGICAL) ×2
ELECTRODE BLDE 4.0 EZ CLN MEGD (MISCELLANEOUS) IMPLANT
ELECTRODE REM PT RTRN 9FT ADLT (ELECTROSURGICAL) ×1 IMPLANT
GAUZE XEROFORM 1X8 LF (GAUZE/BANDAGES/DRESSINGS) ×2 IMPLANT
GLOVE BIO SURGEON STRL SZ7 (GLOVE) ×2 IMPLANT
GLOVE BIO SURGEON STRL SZ7.5 (GLOVE) ×2 IMPLANT
GLOVE BIOGEL PI IND STRL 7.0 (GLOVE) ×1 IMPLANT
GLOVE BIOGEL PI IND STRL 8 (GLOVE) ×1 IMPLANT
GLOVE BIOGEL PI INDICATOR 7.0 (GLOVE) ×1
GLOVE BIOGEL PI INDICATOR 8 (GLOVE) ×1
GOWN PREVENTION PLUS XLARGE (GOWN DISPOSABLE) ×2 IMPLANT
GOWN STRL NON-REIN LRG LVL3 (GOWN DISPOSABLE) ×4 IMPLANT
HANDPIECE INTERPULSE COAX TIP (DISPOSABLE) ×2
HOOD PEEL AWAY FACE SHEILD DIS (HOOD) ×4 IMPLANT
KIT BASIN OR (CUSTOM PROCEDURE TRAY) ×2 IMPLANT
KIT ROOM TURNOVER OR (KITS) ×2 IMPLANT
MANIFOLD NEPTUNE II (INSTRUMENTS) ×2 IMPLANT
NEEDLE 22X1 1/2 (OR ONLY) (NEEDLE) ×2 IMPLANT
NS IRRIG 1000ML POUR BTL (IV SOLUTION) ×2 IMPLANT
PACK TOTAL JOINT (CUSTOM PROCEDURE TRAY) ×2 IMPLANT
PAD ARMBOARD 7.5X6 YLW CONV (MISCELLANEOUS) ×4 IMPLANT
PASSER SUT SWANSON 36MM LOOP (INSTRUMENTS) ×2 IMPLANT
PRESSURIZER FEMORAL UNIV (MISCELLANEOUS) ×1 IMPLANT
SET HNDPC FAN SPRY TIP SCT (DISPOSABLE) IMPLANT
SUT ETHIBOND 2 V 37 (SUTURE) ×2 IMPLANT
SUT ETHILON 3 0 FSL (SUTURE) ×2 IMPLANT
SUT VIC AB 0 CTB1 27 (SUTURE) ×2 IMPLANT
SUT VIC AB 1 CTX 36 (SUTURE) ×2
SUT VIC AB 1 CTX36XBRD ANBCTR (SUTURE) ×1 IMPLANT
SUT VIC AB 2-0 CTB1 (SUTURE) ×2 IMPLANT
SYR CONTROL 10ML LL (SYRINGE) ×2 IMPLANT
TOWEL OR 17X24 6PK STRL BLUE (TOWEL DISPOSABLE) ×2 IMPLANT
TOWEL OR 17X26 10 PK STRL BLUE (TOWEL DISPOSABLE) ×2 IMPLANT
TOWER CARTRIDGE SMART MIX (DISPOSABLE) ×1 IMPLANT
TRAY FOLEY CATH 14FR (SET/KITS/TRAYS/PACK) ×1 IMPLANT
WATER STERILE IRR 1000ML POUR (IV SOLUTION) ×8 IMPLANT

## 2011-08-20 NOTE — Anesthesia Postprocedure Evaluation (Signed)
Anesthesia Post Note  Patient: Diane Stevenson  Procedure(s) Performed: Procedure(s) (LRB): TOTAL HIP ARTHROPLASTY (Right)  Anesthesia type: General  Patient location: PACU  Post pain: Pain level controlled and Adequate analgesia  Post assessment: Post-op Vital signs reviewed, Patient's Cardiovascular Status Stable, Respiratory Function Stable, Patent Airway and Pain level controlled  Last Vitals:  Filed Vitals:   08/20/11 1630  BP: 161/67  Pulse: 88  Temp:   Resp: 17    Post vital signs: Reviewed and stable  Level of consciousness: awake, alert  and oriented  Complications: No apparent anesthesia complications

## 2011-08-20 NOTE — Progress Notes (Signed)
Orthopedic Tech Progress Note Patient Details:  Diane Stevenson [facility] [birth year] [id number] Applied overhead frame. Patient ID: Diane Stevenson, female   DOB: 03/31/1930, 78 y.o.   MRN: [id number]   Diane Stevenson 08/20/2011, 7:23 PM

## 2011-08-20 NOTE — Anesthesia Preprocedure Evaluation (Signed)
Anesthesia Evaluation  Patient identified by MRN, date of birth, ID band Patient awake    Reviewed: Allergy & Precautions, H&P , NPO status , Patient's Chart, lab work & pertinent test results  Airway Mallampati: I TM Distance: >3 FB Neck ROM: full    Dental  (+) Teeth Intact   Pulmonary          Cardiovascular hypertension, Rhythm:regular Rate:Normal     Neuro/Psych    GI/Hepatic   Endo/Other  Diabetes mellitus-, Type 2, Oral Hypoglycemic Agents  Renal/GU      Musculoskeletal   Abdominal   Peds  Hematology   Anesthesia Other Findings   Reproductive/Obstetrics                           Anesthesia Physical Anesthesia Plan  ASA: III  Anesthesia Plan: General   Post-op Pain Management:    Induction: Intravenous  Airway Management Planned: Oral ETT  Additional Equipment:   Intra-op Plan:   Post-operative Plan:   Informed Consent: I have reviewed the patients History and Physical, chart, labs and discussed the procedure including the risks, benefits and alternatives for the proposed anesthesia with the patient or authorized representative who has indicated his/her understanding and acceptance.     Plan Discussed with: CRNA, Anesthesiologist and Surgeon  Anesthesia Plan Comments:         Anesthesia Quick Evaluation

## 2011-08-20 NOTE — Anesthesia Procedure Notes (Signed)
Procedure Name: Intubation Date/Time: 08/20/2011 1:35 PM Performed by: Rossie Muskrat L Pre-anesthesia Checklist: Patient identified, Patient being monitored, Timeout performed, Emergency Drugs available and Suction available Patient Re-evaluated:Patient Re-evaluated prior to inductionOxygen Delivery Method: Circle system utilized Preoxygenation: Pre-oxygenation with 100% oxygen Intubation Type: IV induction Ventilation: Mask ventilation without difficulty Laryngoscope Size: Miller and 2 Grade View: Grade I Tube type: Oral Tube size: 7.5 mm Number of attempts: 1 Airway Equipment and Method: Stylet Placement Confirmation: ETT inserted through vocal cords under direct vision,  breath sounds checked- equal and bilateral and positive ETCO2 Secured at: 20 cm Tube secured with: Tape Dental Injury: Teeth and Oropharynx as per pre-operative assessment

## 2011-08-20 NOTE — Preoperative (Signed)
Beta Blockers   Reason not to administer Beta Blockers:Not Applicable 

## 2011-08-20 NOTE — Progress Notes (Signed)
ANTICOAGULATION CONSULT NOTE - Initial Consult  Pharmacy Consult for coumadin Indication: VTE prophylaxis  Allergies  Allergen Reactions  . Celebrex (Celecoxib) Hives and Swelling    Caused lips to swell.  . Sulfur Hives and Swelling    Caused lips to swell  . Ibuprofen Itching and Swelling    Vital Signs: Temp: 97 F (36.1 C) (03/27 1645) Temp src: Oral (03/27 1049) BP: 134/59 mmHg (03/27 1645) Pulse Rate: 87  (03/27 1645)  Labs: No results found for this basename: HGB:2,HCT:3,PLT:3,APTT:3,LABPROT:3,INR:3,HEPARINUNFRC:3,CREATININE:3,CKTOTAL:3,CKMB:3,TROPONINI:3 in the last 72 hours The CrCl is unknown because both a height and weight (above a minimum accepted value) are required for this calculation.  Medical History: Past Medical History  Diagnosis Date  . Breast cancer 08/04/1995    left  . Hypertension   . DJD (degenerative joint disease)   . Type 2 diabetes mellitus   . Dyspareunia   . Diabetes mellitus     Medications:  Scheduled:    . cyanocobalamin  500 mcg Oral Daily  . dextrose 5 % and 0.45 % NaCl with KCl 20 mEq/L      . dextrose  12.5 g Intravenous Once  . glimepiride  2 mg Oral QAC breakfast  . insulin aspart  0-15 Units Subcutaneous TID WC  . metFORMIN  1,000 mg Oral BID WC  . ondansetron      . ramipril  10 mg Oral Daily  . DISCONTD:  ceFAZolin (ANCEF) IV  2 g Intravenous 60 min Pre-Op  . DISCONTD: droperidol  0.625 mg Intravenous Once  . DISCONTD: metFORMIN  1,000 mg Oral BID WC    Assessment: 82 YOF s/p right total hip arthroplasty to start coumadin for VTE prophylaxis. Baseline INR 1.06 on 3/20, not on anticoagulants prior to admission.   Goal of Therapy:  INR 2-3   Plan:  - coumadin 5mg  po x 1 - f/u daily INR - coumadin education book and video  Riki Rusk 08/20/2011,6:40 PM

## 2011-08-20 NOTE — Interval H&P Note (Signed)
History and Physical Interval Note:  08/20/2011 1:14 PM  Diane Stevenson  has presented today for surgery, with the diagnosis of DEGENERATIVE JOINT DISEASE RIGHT HIP  The various methods of treatment have been discussed with the patient and family. After consideration of risks, benefits and other options for treatment, the patient has consented to  Procedure(s) (LRB): TOTAL HIP ARTHROPLASTY (Right) as a surgical intervention .  The patients' history has been reviewed, patient examined, no change in status, stable for surgery.  I have reviewed the patients' chart and labs.  Questions were answered to the patient's satisfaction.     [name]

## 2011-08-20 NOTE — Op Note (Signed)
PATIENT ID:      Diane Stevenson  MRN:     [id number] DOB/AGE:    [birth year] / 78 y.o.       OPERATIVE REPORT    DATE OF PROCEDURE:  08/20/2011       PREOPERATIVE DIAGNOSIS:  DEGENERATIVE JOINT DISEASE RIGHT HIP                                                       Estimated Body mass index is 25.49 kg/(m^2) as calculated from the following:   Height as of 07/23/11: 5\' 5" (1.651 m).   Weight as of 07/23/11: 153 lb 3.2 oz(69.491 kg).     POSTOPERATIVE DIAGNOSIS:  DEGENERATIVE JOINT DISEASE RIGHT HIP                                                           PROCEDURE:  R total hip arthroplasty using a 52mm mm DePuy Pinnacle  Cup, [employer], 10-degree polyethylene liner index superior  and posterior, a +0 36 mm metal head, a #5 Summit basic stem, cemented double batch of DePuy 1 cement with 1500 mg of Zinacef, 12 mm centralizer and #3 central canal occluder   SURGEON: Sayeed Weatherall J    ASSISTANT:   Jimsen Javier PA-S  (present throughout entire procedure and necessary for timely completion of the procedure)   ANESTHESIA:  General  BLOOD LOSS: 300cc FLUID REPLACEMENT: 1500 crystalloid DRAINS: Foley Catheter URINE OUTPUT: 300 COMPLICATIONS:  None    INDICATIONS FOR PROCEDURE: A 78 y.o. year-old @GENDER @  with end-stage arthritis of the R hip for 3 years, x-rays show bone-on-bone arthritic changes. Despite conservative measures with observation, anti-inflammatory medicine, narcotics, use of a cane, has severe unremitting pain and can ambulate only 2 blocks before resting.  Patient desires elective right total hip arthroplasty to decrease pain and increase function. The risks, benefits, and alternatives were discussed at length including but not limited to the risks of infection, bleeding, nerve injury, stiffness, blood clots, the need for revision surgery, cardiopulmonary complications, among others, and they were willing to proceed.y have been discussed. Questions answered.       PROCEDURE IN DETAIL: The patient was identified by armband,  received preoperative IV antibiotics in the holding area at [facility], taken to the operating room , appropriate anesthetic monitors  were attached and general endotracheal anesthesia induced. Foley catheter was inserted. She was rolled into the L lateral decubitus position and fixed there with a Stulberg Mark II pelvic clamp and the R lower extremity was then prepped and draped  in the usual sterile fashion from the ankle to the hemipelvis. A time-out  procedure was performed. The skin along the lateral hip and thigh  infiltrated with 10 mL of 0.5% Marcaine and epinephrine solution. We  then made a posterolateral approach to the hip. With a #10 blade, 15 cm  incision through skin and subcutaneous tissue down to the level of the  IT band. Small bleeders were identified and cauterized. IT band cut in  line with skin incision exposing the greater trochanter. A Cobra retractor was placed between the gluteus  minimus and the superior hip joint capsule, and a spiked Cobra between the quadratus femoris and the inferior hip joint capsule. This isolated the short  external rotators and piriformis tendons. These were tagged with a #2 Ethibond  suture and cut off their insertion on the intertrochanteric crest. The posterior  capsule was then developed into an acetabular-based flap from Posterior Superior off of the acetabulum out over the femoral neck and back posterior inferior to the acetabular rim. This flap was tagged with two #2 Ethibond sutures and retracted protecting the sciatic nerve. This exposed the arthritic femoral head and osteophytes. The hip was then flexed and internally rotated, dislocating the femoral head and a standard neck cut performed 1 fingerbreadth above the lesser trochanter.  A spiked Cobra was placed in the cotyloid notch and a Hohmann retractor was then used to lever the femur anteriorly off of the anterior  pelvic column. A posterior-inferior wing retractor was placed at the junction of the acetabulum and the ischium completing the acetabular exposure.We then removed the peripheral osteophytes and labrum from the acetabulum. We then reamed the acetabulum up to 51 mm with basket reamers obtaining good coverage in all quadrants, irrigated out with normal  saline solution and hammered into place a 52 mm pinnacle cup in 45  degrees of abduction and about 20 degrees of anteversion. More  peripheral osteophytes removed and a trial 10-degree liner placed with the  index superior-posterior. The hip was then flexed and internally rotated exposing the  proximal femur, which was entered with the initiating reamer followed by  the tapered reamers up to a 4-5 tapered reamer and broaching up to a #5 broach, which had good fit and fill. A trial reduction was then  performed with a +0  36-mm ball on the standard neck and  excellent stability was noted with at 90 of flexion with 70deg of  internal rotation and then full extension withexternal rotation. The hip  could not be dislocated in full extension. The knee could easily flex  to about 140 degrees. We also stretched the abductors at this point,  because of the preexisting adductor contractures. All trial components  were then removed. The acetabulum was irrigated out with normal saline  solution. A titanium [facility] [facility] was then screwed into place  followed by a 10-degree polyethylene liner index superior-posterior. On  the femoral side, we then sized for a #3 cement restrictor and irrigated  out the femoral canal with normal saline solution and dried with suction  and sponges. The cement restrictor was placed to the appropriate depth. A  double batch of DePuy 1 cement with 1500 mg of Zinacef was mixed and  injected into the canal under pressure followed by #5 Summit basic stem  in 20 degrees of anteversion and as this was held in place, excess cement   was removed. At this point, a +0 36-mm metal head was  hammered on the stem. The hip was reduced. We checked our stability  one more time and found to be excellent. The wound was once again  thoroughly irrigated out with normal saline solution pulse lavage. The  capsular flap and short external rotators were repaired back to the  intertrochanteric crest through drill holes with a #2 Ethibond suture.  The IT band was closed with running 1 Vicryl suture. The subcutaneous  tissue with 0 and 2-0 undyed Vicryl suture and the skin with running  interlocking 3-0 nylon suture. Dressing of Xeroform and Mepilex was  then applied. The patient was then unclamped, rolled supine, awaken extubated and taken to recovery room without difficulty in stable condition.   Gean Birchwood J 08/20/2011, 2:40 PM

## 2011-08-20 NOTE — Transfer of Care (Signed)
Immediate Anesthesia Transfer of Care Note  Patient: Diane Stevenson [facility]  Procedure(s) Performed: Procedure(s) (LRB): TOTAL HIP ARTHROPLASTY (Right)  Patient Location: PACU  Anesthesia Type: General  Level of Consciousness: awake, alert  and oriented  Airway & Oxygen Therapy: Patient Spontanous Breathing and Patient connected to nasal cannula oxygen  Post-op Assessment: Report given to PACU RN, Post -op Vital signs reviewed and stable and Patient moving all extremities  Post vital signs: Reviewed and stable  Complications: No apparent anesthesia complications

## 2011-08-21 ENCOUNTER — Encounter (HOSPITAL_COMMUNITY): Payer: Self-pay | Admitting: Orthopedic Surgery

## 2011-08-21 LAB — BASIC METABOLIC PANEL
BUN: 13 mg/dL (ref 6–23)
CO2: 24 mEq/L (ref 19–32)
Chloride: 98 mEq/L (ref 96–112)
Creatinine, Ser: 0.68 mg/dL (ref 0.50–1.10)
GFR calc Af Amer: >90 mL/min (ref >90)
Potassium: 4.6 mEq/L (ref 3.5–5.1)

## 2011-08-21 LAB — GLUCOSE, CAPILLARY
Glucose-Capillary: 249 mg/dL — ABNORMAL HIGH (ref 70–99)
Glucose-Capillary: 258 mg/dL — ABNORMAL HIGH (ref 70–99)
Glucose-Capillary: 225 mg/dL — ABNORMAL HIGH (ref 70–99)

## 2011-08-21 LAB — CBC
HCT: 22.7 % — ABNORMAL LOW (ref 36.0–46.0)
Hemoglobin: 7.3 g/dL — ABNORMAL LOW (ref 12.0–15.0)
MCV: 87 fL (ref 78.0–100.0)
RBC: 2.61 MIL/uL — ABNORMAL LOW (ref 3.87–5.11)
RDW: 15.5 % (ref 11.5–15.5)
WBC: 7 10*3/uL (ref 4.0–10.5)

## 2011-08-21 LAB — PROTIME-INR: Prothrombin Time: 16.6 seconds — ABNORMAL HIGH (ref 11.6–15.2)

## 2011-08-21 MED ORDER — CEFAZOLIN SODIUM 1-5 GM-% IV SOLN
INTRAVENOUS | Status: DC | PRN
  Administered 2011-08-20: 2 g via INTRAVENOUS

## 2011-08-21 MED ORDER — WARFARIN SODIUM 2 MG PO TABS
2.0000 mg | ORAL_TABLET | Freq: Once | ORAL | Status: AC
  Administered 2011-08-21: 2 mg via ORAL
  Filled 2011-08-21: qty 1

## 2011-08-21 NOTE — Progress Notes (Signed)
Patient ID: Diane Stevenson, female   DOB: [birth year], 79 y.o.   MRN: [id number] PATIENT ID: Diane Stevenson  MRN: [id number]  DOB/AGE:  02/17/1930 / 78 y.o.  1 Day Post-Op Procedure(s) (LRB): TOTAL HIP ARTHROPLASTY (Right)    PROGRESS NOTE Subjective: Patient is alert, oriented,noNausea, no Vomiting, yes passing gas, no Bowel Movement. Taking PO well. Denies SOB, Chest or Calf Pain. Using Incentive Spirometer, PAS in place. Ambulate WBAT Patient reports pain as 2 on 0-10 scale , denies any weakness, dizziness. .    Objective: Vital signs in last 24 hours: Filed Vitals:   08/20/11 1645 08/20/11 1715 08/20/11 2126 08/21/11 0601  BP: 134/59 134/49 124/53 122/55  Pulse: 87 87 89 86  Temp: 97 F (36.1 C) 97.1 F (36.2 C) 97.4 F (36.3 C) 97.8 F (36.6 C)  TempSrc:  Oral    Resp: 12 14 18 18   SpO2: 100% 100% 100% 100%      Intake/Output from previous day: I/O last 3 completed shifts: In: 2650 [I.V.:2150; IV Piggyback:500] Out: 850 [Urine:750; Blood:100]   Intake/Output this shift: Total I/O In: 1356.3 [I.V.:1356.3] Out: -    LABORATORY DATA:  Basename 08/21/11 0627 08/21/11 0520 08/20/11 2125 08/20/11 2120  WBC -- 7.0 -- --  HGB -- 7.3* -- --  HCT -- 22.7* -- --  PLT -- 210 -- --  NA -- 129* -- --  K -- 4.6 -- --  CL -- 98 -- --  CO2 -- 24 -- --  BUN -- 13 -- --  CREATININE -- 0.68 -- --  GLUCOSE -- 273* -- --  GLUCAP 249* -- 324* 316*  INR -- 1.32 -- --  CALCIUM -- 7.8* -- --    Examination: Neurologically intact ABD soft Neurovascular intact Sensation intact distally Intact pulses distally Dorsiflexion/Plantar flexion intact Incision: dressing C/D/I No cellulitis present Compartment soft} XR AP&Lat of hip shows well placed\fixed THA  Assessment:   1 Day Post-Op Procedure(s) (LRB): TOTAL HIP ARTHROPLASTY (Right) ADDITIONAL DIAGNOSIS:  Diabetes  Plan: PT/OT WBAT, THA  posterior precautions Pt asx for anemia, making urine, wound dry with min swelling,  will monitor for sx recheck hgb in AM DVT Prophylaxis: SCDx72hr\Coumadin x 2weeks, monitor INR 1.5-2.0 target  DISCHARGE PLAN: Home  DISCHARGE NEEDS: HHPT, HHRN, CPM, Walker and 3-in-1 comode seat

## 2011-08-21 NOTE — Progress Notes (Signed)
Physical Therapy Note   08/21/11 1400  PT Visit Information  Last PT Received On 08/21/11  Precautions  Precautions Posterior Hip  Precaution Booklet Issued Yes (comment)  Restrictions  Weight Bearing Restrictions Yes  RLE Weight Bearing WBAT  Bed Mobility  Bed Mobility Yes  Supine to Sit 3: Mod assist  Supine to Sit Details (indicate cue type and reason) A with R LE, cues for sequencing, technique  Sitting - Scoot to Edge of Bed 4: Min assist  Transfers  Transfers Yes  Sit to Stand 3: Mod assist;With upper extremity assist;From bed  Sit to Stand Details (indicate cue type and reason) cues to scoot to EOB, for UE use, anterior wt shift  Stand to Sit 4: Min assist;With upper extremity assist;To bed  Stand to Sit Details cues to use UEs, control descent  Ambulation/Gait  Ambulation/Gait Yes  Ambulation/Gait Assistance 4: Min assist  Ambulation/Gait Assistance Details (indicate cue type and reason) cues for upright posture, sequencing, positioning in RW.  pt notes no lightheadedness this pm.    Ambulation Distance (Feet) 100 Feet  Assistive device Rolling walker  Gait Pattern Step-to pattern;Decreased step length - left;Decreased stance time - right;Trunk flexed  Stairs No  Wheelchair Mobility  Wheelchair Mobility No  Posture/Postural Control  Posture/Postural Control No significant limitations  Balance  Balance Assessed No  Exercises  Exercises Total Joint  Total Joint Exercises  Long Arc Albert;Right;10 reps;Seated  PT - End of Session  Equipment Utilized During Treatment Gait belt  Activity Tolerance Patient tolerated treatment well  Patient left in bed;with call bell in reach;with family/visitor present  Nurse Communication Mobility status for transfers;Mobility status for ambulation  General  Behavior During Session [facility] for tasks performed  Cognition [facility] for tasks performed  PT - Assessment/Plan  Comments on Treatment Session pt presents s/p THA.  pt notes  feeling better this pm without c/o lightheadedness.  pt continuing to make good progress.    PT Plan Discharge plan remains appropriate;Frequency remains appropriate  PT Frequency 7X/week  Follow Up Recommendations Home health PT;Supervision/Assistance - 24 hour  Equipment Recommended None recommended by PT  Acute Rehab PT Goals  PT Goal: Supine/Side to Sit - Progress Progressing toward goal  PT Goal: Sit to Supine/Side - Progress Progressing toward goal  PT Goal: Sit to Stand - Progress Progressing toward goal  PT Goal: Stand to Sit - Progress Progressing toward goal  PT Goal: Ambulate - Progress Progressing toward goal  PT Goal: Perform Home Exercise Program - Progress Progressing toward goal  Additional Goals  PT Goal: Additional Goal #1 - Progress Progressing toward goal    Mack Hook, PT [phone]

## 2011-08-21 NOTE — Addendum Note (Signed)
Addendum  created 08/21/11 0931 by Turner Daniels, CRNA   Modules edited:Anesthesia Medication Administration

## 2011-08-21 NOTE — Progress Notes (Signed)
UR COMPLETED  

## 2011-08-21 NOTE — Progress Notes (Signed)
Physical Therapy Evaluation  Past Medical History  Diagnosis Date  . Breast cancer 08/04/1995    left  . Hypertension   . DJD (degenerative joint disease)   . Type 2 diabetes mellitus   . Dyspareunia   . Diabetes mellitus    Past Surgical History  Procedure Date  . Appendectomy   . Breast surgery 1998     Left Breast  . Breast surgery 1993    Right Breast  . Breast surgery 1997    Left Breast  . Partial hip arthroplasty 1998    Left side  . Foot arthrodesis, modified mcbride     1984  . Bone spur     1984  . Joint replacement     rt knee    08/21/11 0800  PT Visit Information  Last PT Received On 08/21/11  Patient Stated Goals  Goal #1 Driving  Precautions  Precautions Posterior Hip  Precaution Booklet Issued Yes (comment)  Restrictions  Weight Bearing Restrictions Yes  RLE Weight Bearing WBAT  Home Living  Lives With Spouse  Receives Help From Family  Type of Home House  Home Layout One level  Home Access Stairs to enter  Entrance Stairs-Rails Right  Entrance Stairs-Number of Steps 3  Bathroom Shower/Tub Walk-in Pension scheme manager Yes  How Accessible Accessible via walker  Home Adaptive Equipment Bedside commode/3-in-1;Walker - rolling;Straight cane  Prior Function  Level of Independence Independent with gait;Independent with transfers;Needs assistance with ADLs;Needs assistance with homemaking  Able to Take Stairs? Yes  Driving Yes  Vocation Retired  Engineer, building services Level Oriented X4  Bed Mobility  Bed Mobility Yes  Supine to Sit 1: +2 Total assist;Patient percentage (comment) (pt=70%)  Supine to Sit Details (indicate cue type and reason) cues fo sequencing, use of UEs  Sitting - Scoot to [place] of Bed 4: Min assist  Sitting - Scoot to [place] of Bed Details (indicate cue type and reason) used pad and cues for UE use  Transfers  Transfers Yes  Sit to Stand 3: Mod assist;With upper extremity assist;From  chair/3-in-1  Sit to Stand Details (indicate cue type and reason) cues for use of UEs, LE positioning, anterior wt shift  Stand to Sit 4: Min assist;To chair/3-in-1;With upper extremity assist;With armrests  Stand to Sit Details cues for use of UEs, control descent  Ambulation/Gait  Ambulation/Gait Yes  Ambulation/Gait Assistance 4: Min assist  Ambulation/Gait Assistance Details (indicate cue type and reason) cues for use of RW, upright posture, sequencing  Ambulation Distance (Feet) 10 Feet (10,5,15)  Assistive device Rolling walker  Gait Pattern Step-to pattern;Decreased step length - left;Decreased stance time - right;Trunk flexed  Stairs No  Wheelchair Mobility  Wheelchair Mobility No  Posture/Postural Control  Posture/Postural Control No significant limitations  Balance  Balance Assessed No  RLE Assessment  RLE Assessment X  RLE Strength  RLE Overall Strength Comments AAROM WFL, limited by pain  LLE Assessment  LLE Assessment [facility]  Exercises  Exercises Total Joint  Total Joint Exercises  Ankle Circles/Pumps AROM;Both;10 reps  Quad Sets AROM;Both;10 reps  PT - End of Session  Equipment Utilized During Treatment Gait belt  Activity Tolerance (pt noted feeling lightheaded at times)  Patient left in chair;with call bell in reach;with family/visitor present  Nurse Communication Mobility status for transfers;Mobility status for ambulation  General  Behavior During Session [facility] for tasks performed  Cognition [facility] for tasks performed  PT Assessment  Clinical Impression Statement pt  presents s/p THA.  pt very motivated and should make great progress.  pt notes feeling a little lightheaded during mobility, but able to ambulate.    PT Recommendation/Assessment Patient will need skilled PT in the acute care venue  PT Problem List Decreased strength;Decreased activity tolerance;Decreased balance;Decreased mobility;Decreased knowledge of use of DME;Decreased knowledge of precautions;Pain   Barriers to Discharge None  PT Therapy Diagnosis  Abnormality of gait;Acute pain  PT Plan  PT Frequency 7X/week  PT Treatment/Interventions DME instruction;Gait training;Stair training;Functional mobility training;Therapeutic activities;Therapeutic exercise;Balance training;Patient/family education  PT Recommendation  Follow Up Recommendations Home health PT;Supervision/Assistance - 24 hour  Equipment Recommended None recommended by PT  Individuals Consulted  Consulted and Agree with Results and Recommendations Patient  Acute Rehab PT Goals  PT Goal Formulation With patient  Time For Goal Achievement 7 days  Pt will go Supine/Side to Sit with modified independence  PT Goal: Supine/Side to Sit - Progress Goal set today  Pt will go Sit to Supine/Side with modified independence  PT Goal: Sit to Supine/Side - Progress Goal set today  Pt will go Sit to Stand with modified independence  PT Goal: Sit to Stand - Progress Goal set today  Pt will go Stand to Sit with modified independence  PT Goal: Stand to Sit - Progress Goal set today  Pt will Ambulate >150 feet;with modified independence;with rolling walker  PT Goal: Ambulate - Progress Goal set today  Pt will Go Up / Down Stairs 3-5 stairs;with min assist;with rail(s);with least restrictive assistive device  PT Goal: Up/Down Stairs - Progress Goal set today  Pt will Perform Home Exercise Program Independently  PT Goal: Perform Home Exercise Program - Progress Goal set today  Additional Goals  Additional Goal #1 pt will verbalize 3/3 hip precautions.    PT Goal: Additional Goal #1 - Progress Goal set today    Mack Hook, PT [phone]

## 2011-08-21 NOTE — Progress Notes (Signed)
ANTICOAGULATION CONSULT NOTE - Initial Consult  Pharmacy Consult for coumadin Indication: VTE prophylaxis  Allergies  Allergen Reactions  . Celebrex (Celecoxib) Hives and Swelling    Caused lips to swell.  . Sulfur Hives and Swelling    Caused lips to swell  . Ibuprofen Itching and Swelling    Vital Signs: Temp: 97.8 F (36.6 C) (03/28 0601) BP: 122/55 mmHg (03/28 0601) Pulse Rate: 86  (03/28 0601)  Labs:  Basename 08/21/11 0520  HGB 7.3*  HCT 22.7*  PLT 210  APTT --  LABPROT 16.6*  INR 1.32  HEPARINUNFRC --  CREATININE 0.68  CKTOTAL --  CKMB --  TROPONINI --   The CrCl is unknown because both a height and weight (above a minimum accepted value) are required for this calculation.  Medical History: Past Medical History  Diagnosis Date  . Breast cancer 08/04/1995    left  . Hypertension   . DJD (degenerative joint disease)   . Type 2 diabetes mellitus   . Dyspareunia   . Diabetes mellitus     Medications:  Scheduled:     . cyanocobalamin  500 mcg Oral Daily  . dextrose 5 % and 0.45 % NaCl with KCl 20 mEq/L      . dextrose  12.5 g Intravenous Once  . glimepiride  2 mg Oral QAC breakfast  . insulin aspart  0-15 Units Subcutaneous TID WC  . metFORMIN  1,000 mg Oral BID WC  . ondansetron      . patient's guide to using coumadin book   Does not apply Once  . ramipril  10 mg Oral Daily  . warfarin  5 mg Oral ONCE-1800  . warfarin   Does not apply Once  . Warfarin - Pharmacist Dosing Inpatient   Does not apply q1800  . DISCONTD:  ceFAZolin (ANCEF) IV  2 g Intravenous 60 min Pre-Op  . DISCONTD: droperidol  0.625 mg Intravenous Once  . DISCONTD: metFORMIN  1,000 mg Oral BID WC    Assessment: 82 YOF s/p right total hip arthroplasty on coumadin for VTE prophylaxis. Baseline INR 1.06 on 3/20, not on anticoagulants prior to admission. INR today 1.32  Goal of Therapy:  INR 1.5-2   Plan:  - coumadin 2mg  po x 1 - f/u daily INR  Dezarae Mcclaran  [place] 08/21/2011,8:48 AM

## 2011-08-21 NOTE — Evaluation (Signed)
Occupational Therapy Evaluation Patient Details Name: Diane Stevenson MRN: [id number] DOB: 19-Jun-1929 Today's Date: 08/21/2011  Problem List:  Patient Active Problem List  Diagnoses  . History of breast cancer in female  . Hip arthritis right    Past Medical History:  Past Medical History  Diagnosis Date  . Breast cancer 08/04/1995    left  . Hypertension   . DJD (degenerative joint disease)   . Type 2 diabetes mellitus   . Dyspareunia   . Diabetes mellitus    Past Surgical History:  Past Surgical History  Procedure Date  . Appendectomy   . Breast surgery 1998     Left Breast  . Breast surgery 1993    Right Breast  . Breast surgery 1997    Left Breast  . Partial hip arthroplasty 1998    Left side  . Foot arthrodesis, modified mcbride     1984  . Bone spur     1984  . Joint replacement     rt knee    OT Assessment/Plan/Recommendation OT Assessment Clinical Impression Statement: Pt. presents s/p rt. THR and with increased pain resulting in below problem list. Pt. will benefit from increased OT to get pt. to supervision level at D/C home. OT Recommendation/Assessment: Patient will need skilled OT in the acute care venue OT Problem List: Decreased strength;Decreased activity tolerance;Impaired balance (sitting and/or standing);Decreased knowledge of use of DME or AE;Pain Barriers to Discharge: None OT Therapy Diagnosis : Acute pain OT Plan OT Frequency: Min 2X/week OT Treatment/Interventions: Self-care/ADL training;DME and/or AE instruction;Therapeutic activities;Patient/family education;Balance training OT Recommendation Follow Up Recommendations: Home health OT;Supervision - Intermittent Equipment Recommended: None recommended by OT Individuals Consulted Consulted and Agree with Results and Recommendations: Patient OT Goals Acute Rehab OT Goals OT Goal Formulation: With patient Time For Goal Achievement: 7 days ADL Goals Pt Will Perform Grooming: with  set-up;with supervision;Standing at sink ADL Goal: Grooming - Progress: Goal set today Pt Will Perform Lower Body Bathing: with set-up;with supervision;Sit to stand from chair ADL Goal: Lower Body Bathing - Progress: Goal set today Pt Will Perform Lower Body Dressing: with set-up;with supervision;Sit to stand from chair;with adaptive equipment ADL Goal: Lower Body Dressing - Progress: Goal set today Pt Will Transfer to Toilet: with supervision;with DME;Ambulation;3-in-1 ADL Goal: Toilet Transfer - Progress: Goal set today Pt Will Perform Toileting - Hygiene: with set-up;Sit to stand from 3-in-1/toilet ADL Goal: Toileting - Hygiene - Progress: Goal set today Pt Will Perform Tub/Shower Transfer: Shower transfer;with supervision;with DME;Ambulation;Shower seat with back;Maintaining hip precautions ADL Goal: Web designer - Progress: Goal set today Additional ADL Goal #1: Pt. will recall 3 hip precautions ADL Goal: Additional Goal #1 - Progress: Goal set today  OT Evaluation Precautions/Restrictions  Precautions Precautions: Posterior Hip Precaution Booklet Issued: Yes (comment) Restrictions Weight Bearing Restrictions: Yes RLE Weight Bearing: Weight bearing as tolerated Prior Functioning Home Living Lives With: Spouse Receives Help From: Family Type of Home: House Home Layout: One level Home Access: Stairs to enter Entrance Stairs-Rails: Right Entrance Stairs-Number of Steps: 3 Bathroom Shower/Tub: Health visitor: Standard Bathroom Accessibility: Yes How Accessible: Accessible via walker Home Adaptive Equipment: Bedside commode/3-in-1;Walker - rolling;Straight cane Prior Function Level of Independence: Independent with gait;Independent with transfers;Needs assistance with ADLs;Needs assistance with homemaking Able to Take Stairs?: Yes Driving: Yes Vocation: Retired ADL ADL Eating/Feeding: Performed;Independent Where Assessed - Eating/Feeding:  Chair Grooming: Wash/dry hands;Wash/dry face;Performed;Set up;Minimal assistance Where Assessed - Grooming: Standing at sink Upper Body Bathing: Simulated;Chest;Right arm;Left arm;Abdomen;Set up  Where Assessed - Upper Body Bathing: Sitting, bed Lower Body Bathing: Simulated;Moderate assistance Where Assessed - Lower Body Bathing: Sit to stand from chair Upper Body Dressing: Performed;Minimal assistance Upper Body Dressing Details (indicate cue type and reason): With donning gown Where Assessed - Upper Body Dressing: Sitting, bed Lower Body Dressing: Simulated;Moderate assistance Where Assessed - Lower Body Dressing: Sit to stand from chair Toilet Transfer: Performed;Moderate assistance Toilet Transfer Details (indicate cue type and reason): Mod verbal cues for hand placement and technique Toilet Transfer Method: Ambulating Toilet Transfer Equipment: Raised toilet seat with arms (or 3-in-1 over toilet) Toileting - Clothing Manipulation: Minimal assistance;Performed Toileting - Clothing Manipulation Details (indicate cue type and reason): With moving gown Where Assessed - Toileting Clothing Manipulation: Sit to stand from 3-in-1 or toilet Toileting - Hygiene: Set up;Performed Toileting - Hygiene Details (indicate cue type and reason): Min verbal cues for safety with hand placement and technique Where Assessed - Toileting Hygiene: Sit to stand from 3-in-1 or toilet Equipment Used: Rolling walker Ambulation Related to ADLs: Pt. min assist ~10' with RW ADL Comments: Pt. educated on 3/3 hip precautions and techniques for completing LB ADLs with use of AE to complete.    Cognition Cognition Orientation Level: Oriented X4   Extremity Assessment RUE Assessment RUE Assessment: Within Functional Limits LUE Assessment LUE Assessment: Within Functional Limits Mobility  Bed Mobility Bed Mobility: Yes Supine to Sit: 1: +2 Total assist;Patient percentage (comment) (pt=70%) Supine to Sit  Details (indicate cue type and reason): Mod verbal cues for bil UE use on bed to scoot hips forward Transfers Transfers: Yes Sit to Stand: 3: Mod assist;With upper extremity assist;From chair/3-in-1 Sit to Stand Details (indicate cue type and reason): Mod verbal cues for hand placement and technique Stand to Sit: 4: Min assist;To chair/3-in-1;With upper extremity assist;With armrests    End of Session OT - End of Session Equipment Utilized During Treatment: Gait belt Activity Tolerance: Patient tolerated treatment well Patient left: in chair;with call bell in reach Nurse Communication: Mobility status for transfers General Behavior During Session: [facility] for tasks performed Cognition: [facility] for tasks performed  Co-treat with Flora Lipps, oTR/L Pager [phone] 08/21/2011, 9:31 AM

## 2011-08-21 NOTE — Addendum Note (Signed)
Addendum  created 08/21/11 0931 by Halcyon Heck L Bretton Tandy, CRNA   Modules edited:Anesthesia Medication Administration    

## 2011-08-22 LAB — GLUCOSE, CAPILLARY
Glucose-Capillary: 165 mg/dL — ABNORMAL HIGH (ref 70–99)
Glucose-Capillary: 202 mg/dL — ABNORMAL HIGH (ref 70–99)

## 2011-08-22 LAB — CBC
Hemoglobin: 7.9 g/dL — ABNORMAL LOW (ref 12.0–15.0)
MCH: 28 pg (ref 26.0–34.0)
Platelets: 290 10*3/uL (ref 150–400)
RBC: 2.82 MIL/uL — ABNORMAL LOW (ref 3.87–5.11)
WBC: 12.9 10*3/uL — ABNORMAL HIGH (ref 4.0–10.5)

## 2011-08-22 LAB — PROTIME-INR: Prothrombin Time: 16.8 seconds — ABNORMAL HIGH (ref 11.6–15.2)

## 2011-08-22 MED ORDER — WARFARIN SODIUM 2.5 MG PO TABS
2.5000 mg | ORAL_TABLET | Freq: Once | ORAL | Status: AC
  Administered 2011-08-22: 2.5 mg via ORAL
  Filled 2011-08-22: qty 1

## 2011-08-22 NOTE — Progress Notes (Signed)
  CARE MANAGEMENT NOTE 08/22/2011  Patient:  Diane Stevenson, Diane Stevenson   Account Number:  [id number]  Date Initiated:  08/22/2011  Documentation initiated by:  Vance Peper  Subjective/Objective Assessment:   78 yr old female s/p right total hip arthroplasty     Action/Plan:   Spoke with patient's husband(she was asleep). Preoperatively setup with [facility], noc hanges. Husband states she is progressing slowly, may need rehab.Has rolling walker and 3in1. Will follow   Anticipated DC Date:  08/23/2011   Anticipated DC Plan:  HOME W HOME HEALTH SERVICES  In-house referral  Clinical Social Worker      DC Planning Services  CM consult      [facility] Choice  HOME HEALTH   Choice offered to / List presented to:  C-3 Spouse        HH arranged  HH-1 RN  HH-2 PT      Status of service:  In process, will continue to follow

## 2011-08-22 NOTE — Progress Notes (Signed)
Clinical Social Work Department BRIEF PSYCHOSOCIAL ASSESSMENT 08/22/2011  Patient:  Diane Stevenson, Diane Stevenson     Account Number:  [id number]     Admit date:  08/20/2011  Clinical Social Worker:  Peggyann Shoals  Date/Time:  08/22/2011 01:00 PM  Referred by:  Care Management  Date Referred:  08/21/2011 Referred for  SNF Placement   Other Referral:   Interview type:  Family Other interview type:    PSYCHOSOCIAL DATA Living Status:  HUSBAND Admitted from facility:   Level of care:   Primary support name:  Diane Stevenson Primary support relationship to patient:   Degree of support available:   Very supportive, able to provide 24 supervision. Pt's daughter, Diane Stevenson, lives near by.    CURRENT CONCERNS Current Concerns  None Noted   Other Concerns:    SOCIAL WORK ASSESSMENT / PLAN CSW met with pt and family to address consult. Pt shared that she has plans to dishcarge home with Diane Stevenson (which was arranged by [facility]). Pt's husband shared that he is able to provide supervision and manage pt's care. Pt and husband declined SNF.   Assessment/plan status:  No Further Intervention Required Other assessment/ plan:   CSW is signed off as no further clinical social work needs identified at this time. Please reconsult if a need arises prior to discharge. RNCM is aware and following   Information/referral to community resources:    PATIENT'S/FAMILY'S RESPONSE TO PLAN OF CARE: Pt was alert and oriented. Pt was very pleasant. Pt shared that she would like to discharge home with home health services.     Dede Query, MSW, Theresia Majors [phone]

## 2011-08-22 NOTE — Progress Notes (Signed)
Diane Stevenson, [place] [phone] 08/22/2011

## 2011-08-22 NOTE — Progress Notes (Signed)
Physical Therapy Treatment Patient Details Name: Diane Stevenson MRN: [id number] DOB: Sep 30, 1929 Today's Date: 08/22/2011  PT Assessment/Plan  PT - Assessment/Plan Comments on Treatment Session: initiated stair training this session, up to max assist due to patient with increases weakness. amb around 125 feet this session with 2 seated rest breaks. Possible d/c tomorrow, needs to practice stairs some more before then. PT Plan: Discharge plan remains appropriate;Frequency remains appropriate PT Frequency: 7X/week Follow Up Recommendations: Home health PT;Supervision/Assistance - 24 hour Equipment Recommended: None recommended by PT PT Goals  Acute Rehab PT Goals PT Goal Formulation: With patient Time For Goal Achievement: 7 days PT Goal: Supine/Side to Sit - Progress: Not met PT Goal: Sit to Stand - Progress: Not met PT Goal: Stand to Sit - Progress: Progressing toward goal PT Goal: Ambulate - Progress: Progressing toward goal PT Goal: Up/Down Stairs - Progress: Not met PT Treatment Precautions/Restrictions  Precautions Precautions: Posterior Hip Precaution Booklet Issued: Yes (comment) Precaution Comments: unable to recall precautions. Restrictions Weight Bearing Restrictions: Yes RLE Weight Bearing: Weight bearing as tolerated Mobility (including Balance) Bed Mobility Bed Mobility: Yes Supine to Sit: 2: Max assist;With rails Supine to Sit Details (indicate cue type and reason): Max (A) to bring legs towards EOB, with draw sheet to pivot hips and at shoulders to achieve full sit. Pt. performed slightly more of the task than am session.  Sitting - Scoot to Edge of Bed: 4: Min assist Sitting - Scoot to [place] of Bed Details (indicate cue type and reason): min (A) with draw pad, cues for weight shift. Transfers Transfers: Yes Sit to Stand: 3: Mod assist;From bed;From chair/3-in-1;With armrests;With upper extremity assist Sit to Stand Details (indicate cue type and reason): mod (A)  underarm to achieve standing and for stability. minimal cues for hand placement and managment of R LE. Stand to Sit: 4: Min assist;With upper extremity assist;With armrests;To chair/3-in-1 Stand to Sit Details: min (A) for control of descent. cues for hand placement and management of R LE. Ambulation/Gait Ambulation/Gait: Yes Ambulation/Gait Assistance: Other (comment) (min guard (A)) Ambulation/Gait Assistance Details (indicate cue type and reason): Min guard for safety. verbal cueing for clearance of R LE and sequence of gait with RW. Pt. amb to 5000 gym from her room, rest break before and after stair training then amb halfway back to room. Ambulation Distance (Feet): 125 Feet Assistive device: Rolling walker Gait Pattern: Step-through pattern;Decreased step length - right;Decreased step length - left;Decreased stride length;Decreased hip/knee flexion - right Stairs: Yes Stairs Assistance: 3: Mod assist;2: Max assist Stairs Assistance Details (indicate cue type and reason): initiated stair training today due to possibly going home tomorrow. 3 steps to get into home, rail on right. Educated pt on forward and sideways techniques, recommened sideway technique due to patient level of weakness. up to max assist for stablility to descend stairs, cues to bear more weight through R LE, cues for sequence and technique.  Stair Management Technique: One rail Right;Forwards;Sideways;With walker Number of Stairs: 2  Height of Stairs: 6   Posture/Postural Control Posture/Postural Control: No significant limitations Exercise  Total Joint Exercises Ankle Circles/Pumps: AROM;Both;20 reps;Supine Quad Sets: AROM;Right;10 reps;Supine Gluteal Sets: AROM;Strengthening;Both;10 reps;Supine Straight Leg Raises: AAROM;Strengthening;Right;Other reps (comment);Supine (8 reps) End of Session PT - End of Session Equipment Utilized During Treatment: Gait belt Activity Tolerance: Patient limited by fatigue Patient  left: in chair;with call bell in reach;with family/visitor present Behavior During Session: [facility] for tasks performed Cognition: [facility] for tasks performed  Ardyth Gal SPTA 08/22/2011,  2:55 PM

## 2011-08-22 NOTE — Progress Notes (Signed)
ANTICOAGULATION CONSULT NOTE - Initial Consult  Pharmacy Consult for coumadin Indication: VTE prophylaxis  Allergies  Allergen Reactions  . Celebrex (Celecoxib) Hives and Swelling    Caused lips to swell.  . Sulfur Hives and Swelling    Caused lips to swell  . Ibuprofen Itching and Swelling    Vital Signs: Temp: 99.8 F (37.7 C) (03/29 0529) Temp src: Oral (03/29 0529) BP: 132/54 mmHg (03/29 0529) Pulse Rate: 104  (03/29 0529)  Labs:  Basename 08/22/11 0640 08/21/11 0520  HGB 7.9* 7.3*  HCT 24.4* 22.7*  PLT 290 210  APTT -- --  LABPROT 16.8* 16.6*  INR 1.34 1.32  HEPARINUNFRC -- --  CREATININE -- 0.68  CKTOTAL -- --  CKMB -- --  TROPONINI -- --     Medications:  Scheduled:     . cyanocobalamin  500 mcg Oral Daily  . glimepiride  2 mg Oral QAC breakfast  . insulin aspart  0-15 Units Subcutaneous TID WC  . metFORMIN  1,000 mg Oral BID WC  . patient's guide to using coumadin book   Does not apply Once  . ramipril  10 mg Oral Daily  . warfarin  2 mg Oral ONCE-1800  . Warfarin - Pharmacist Dosing Inpatient   Does not apply q1800    Assessment: 82 YOF s/p right total hip arthroplasty on coumadin for VTE prophylaxis. Baseline INR 1.06 on 3/20, not on anticoagulants prior to admission. INR today 1.34  Goal of Therapy:  INR 1.5-2   Plan:  - coumadin 2.5 mg po x 1 - f/u daily INR  Diane Stevenson 08/22/2011,8:54 AM

## 2011-08-22 NOTE — Progress Notes (Signed)
OT Cancellation Note  Treatment cancelled today due to MD or pharmacist in room with pt +31min. Will see tomorrow. Thanks!Marland Kitchen  Drago Hammonds 08/22/2011, 3:14 PM

## 2011-08-22 NOTE — Progress Notes (Signed)
Patient ID: Diane Stevenson, female   DOB: 09/20/1929, 78 y.o.   MRN: [id number] PATIENT ID: Diane Stevenson  MRN: [id number]  DOB/AGE:  1930/03/22 / 78 y.o.  2 Days Post-Op Procedure(s) (LRB): TOTAL HIP ARTHROPLASTY (Right)    PROGRESS NOTE Subjective: Patient is alert, oriented,noNausea, no Vomiting, yes passing gas, yes Bowel Movement. Taking PO well. Denies SOB, Chest or Calf Pain. Using Incentive Spirometer, PAS in place. Ambulate WBAT in room Patient reports pain as 2 on 0-10 scale  .    Objective: Vital signs in last 24 hours: Filed Vitals:   08/21/11 1042 08/21/11 1429 08/21/11 2059 08/22/11 0529  BP: 160/61 130/72 149/62 132/54  Pulse:  102 116 104  Temp:  98.7 F (37.1 C) 99.3 F (37.4 C) 99.8 F (37.7 C)  TempSrc:   Oral Oral  Resp:  16 18 18   SpO2:  96% 97% 96%      Intake/Output from previous day: I/O last 3 completed shifts: In: 1531.3 [P.O.:175; I.V.:1356.3] Out: 150 [Urine:150]   Intake/Output this shift:     LABORATORY DATA:  Basename 08/22/11 0640 08/22/11 0637 08/21/11 2215 08/21/11 1626 08/21/11 0520  WBC 12.9* -- -- -- 7.0  HGB 7.9* -- -- -- 7.3*  HCT 24.4* -- -- -- 22.7*  PLT 290 -- -- -- 210  NA -- -- -- -- 129*  K -- -- -- -- 4.6  CL -- -- -- -- 98  CO2 -- -- -- -- 24  BUN -- -- -- -- 13  CREATININE -- -- -- -- 0.68  GLUCOSE -- -- -- -- 273*  GLUCAP -- 185* 225* 197* --  INR 1.34 -- -- -- 1.32  CALCIUM -- -- -- -- 7.8*    Examination: Neurologically intact ABD soft Neurovascular intact Sensation intact distally Intact pulses distally Dorsiflexion/Plantar flexion intact Incision: dressing C/D/I No cellulitis present Compartment soft} XR AP&Lat of hip shows well placed\fixed THA  Assessment:   2 Days Post-Op Procedure(s) (LRB): TOTAL HIP ARTHROPLASTY (Right) hgb 7.3>7.9 ADDITIONAL DIAGNOSIS:  Diabetes  Plan: PT/OT WBAT, THA  posterior precautions  DVT Prophylaxis: SCDx72hr\Coumadin x 2weeks, monitor INR 1.5-2.0  target  DISCHARGE PLAN: Home  DISCHARGE NEEDS: HHPT, HHRN, CPM, Walker and 3-in-1 comode seat

## 2011-08-22 NOTE — Progress Notes (Signed)
Physical Therapy Treatment Patient Details Name: Diane Stevenson MRN: [id number] DOB: Mar 09, 1930 Today's Date: 08/22/2011  PT Assessment/Plan  PT - Assessment/Plan Comments on Treatment Session: Pt. was pleasant this morining, motivated to do PT. Appeared lethargic, stating she didn't get much rest last night. Reported being lightheaded after ambulating ~3 feet, sat back on bed, reported relief. 2nd ambulation attempt pt. reported dizziness after ambulating 25 feet and "seeing stars", stars gone once sitting but still a little dizzy, communicated about this to nurse.  PT Plan: Discharge plan remains appropriate;Frequency remains appropriate PT Frequency: 7X/week Follow Up Recommendations: Home health PT;Supervision/Assistance - 24 hour Equipment Recommended: None recommended by PT PT Goals  Acute Rehab PT Goals PT Goal Formulation: With patient Time For Goal Achievement: 7 days PT Goal: Supine/Side to Sit - Progress: Not met PT Goal: Sit to Stand - Progress: Not met PT Goal: Stand to Sit - Progress: Progressing toward goal PT Goal: Ambulate - Progress: Not met Additional Goals PT Goal: Additional Goal #1 - Progress: Not met  PT Treatment Precautions/Restrictions  Precautions Precautions: Posterior Hip Precaution Booklet Issued: Yes (comment) Precaution Comments: unable to recall precautions. Restrictions Weight Bearing Restrictions: Yes RLE Weight Bearing: Weight bearing as tolerated Mobility (including Balance) Bed Mobility Bed Mobility: Yes Supine to Sit: 2: Max assist;With rails;HOB elevated (Comment degrees) (HOB elevated 30 degrees.) Supine to Sit Details (indicate cue type and reason): Max (A) to move legs towards EOB, (A) with draw pad to pivot hips and (A) at shoulders to achieve full sitting. Verbal cues for technique.  Sitting - Scoot to Edge of Bed: 4: Min assist Sitting - Scoot to [place] of Bed Details (indicate cue type and reason): min (A) with draw pad to scoot hips  to EOB Transfers Transfers: Yes Sit to Stand: 3: Mod assist;From bed;With upper extremity assist;From chair/3-in-1 Sit to Stand Details (indicate cue type and reason): Mod (A) to underarms achieve full standing. verbal cues for hand placement and technique.  Stand to Sit: 4: Min assist;With upper extremity assist;With armrests;To bed;To chair/3-in-1 Stand to Sit Details: min (A) for control of descent. minimal verbal cues for hand placement.  Ambulation/Gait Ambulation/Gait: Yes Ambulation/Gait Assistance: 4: Min assist Ambulation/Gait Assistance Details (indicate cue type and reason): Min (A) for managment of RW. pt. amb. ~25 feet then reported dizziness and that she was "seeing stars". Reported some relief with sitting, "stars" went away but still with a little dizziness. Noted 7.9 HGB in lab values report. Notified nurse about dizziness. Ambulation Distance (Feet): 25 Feet Assistive device: Rolling walker Gait Pattern: Step-through pattern;Decreased stance time - right;Decreased step length - right;Decreased step length - left;Decreased stride length  Posture/Postural Control Posture/Postural Control: No significant limitations Exercise  Total Joint Exercises Ankle Circles/Pumps: AROM;Both;20 reps;Supine Quad Sets: AROM;Right;10 reps;Supine Gluteal Sets: AROM;Strengthening;Both;10 reps;Supine Straight Leg Raises: AAROM;Strengthening;Right;Other reps (comment);Supine (8 reps) End of Session PT - End of Session Equipment Utilized During Treatment: Gait belt Activity Tolerance: Patient limited by fatigue Patient left: in chair;with call bell in reach;Other (comment) (with nurse) Nurse Communication: Other (comment) (reported dizziness to nurse.) General Behavior During Session: [facility] for tasks performed Cognition: [facility] for tasks performed  Ardyth Gal SPTA 08/22/2011, 12:01 PM

## 2011-08-23 LAB — GLUCOSE, CAPILLARY
Glucose-Capillary: 135 mg/dL — ABNORMAL HIGH (ref 70–99)
Glucose-Capillary: 194 mg/dL — ABNORMAL HIGH (ref 70–99)
Glucose-Capillary: 177 mg/dL — ABNORMAL HIGH (ref 70–99)

## 2011-08-23 LAB — PROTIME-INR
INR: 1.39 (ref 0.00–1.49)
Prothrombin Time: 17.3 seconds — ABNORMAL HIGH (ref 11.6–15.2)

## 2011-08-23 LAB — CBC
HCT: 23.2 % — ABNORMAL LOW (ref 36.0–46.0)
Hemoglobin: 7.3 g/dL — ABNORMAL LOW (ref 12.0–15.0)
MCV: 87.9 fL (ref 78.0–100.0)
RBC: 2.64 MIL/uL — ABNORMAL LOW (ref 3.87–5.11)
RDW: 16.2 % — ABNORMAL HIGH (ref 11.5–15.5)

## 2011-08-23 MED ORDER — WARFARIN SODIUM 4 MG PO TABS
4.0000 mg | ORAL_TABLET | Freq: Once | ORAL | Status: AC
  Administered 2011-08-23: 4 mg via ORAL
  Filled 2011-08-23: qty 1

## 2011-08-23 NOTE — Progress Notes (Signed)
Subjective: 3 Days Post-Op Procedure(s) (LRB): TOTAL HIP ARTHROPLASTY (Right) Patient reports pain as 3 on 0-10 scale.   No dizziness. Taking po/voiding ok.  Objective: Vital signs in last 24 hours: Temp:  [99.1 F (37.3 C)-99.5 F (37.5 C)] 99.3 F (37.4 C) (03/30 0608) Pulse Rate:  [65-94] 94  (03/30 0608) Resp:  [18] 18  (03/30 0608) BP: (115-133)/(49-76) 128/76 mmHg (03/30 0608) SpO2:  [92 %-100 %] 95 % (03/30 1610)  Intake/Output from previous day: 03/29 0701 - 03/30 0700 In: 120 [P.O.:120] Out: -  Intake/Output this shift:     Basename 08/23/11 0500 08/22/11 0640 08/21/11 0520  HGB 7.3* 7.9* 7.3*    Basename 08/23/11 0500 08/22/11 0640  WBC 7.8 12.9*  RBC 2.64* 2.82*  HCT 23.2* 24.4*  PLT 194 290    Basename 08/21/11 0520  NA 129*  K 4.6  CL 98  CO2 24  BUN 13  CREATININE 0.68  GLUCOSE 273*  CALCIUM 7.8*    Basename 08/23/11 0500 08/22/11 0640  LABPT -- --  INR 1.39 1.34   Right hip:  Neurovascular intact Sensation intact distally Intact pulses distally Dorsiflexion/Plantar flexion intact Incision: dressing C/D/I Compartment soft  Assessment/Plan: 3 Days Post-Op Procedure(s) (LRB): TOTAL HIP ARTHROPLASTY (Right) Acute post op anemia Plan: Up with therapy Plan for discharge tomorrow Cont Oral Iron. Check cbc in am.  Diane Stevenson 08/23/2011, 8:46 AM

## 2011-08-23 NOTE — Progress Notes (Signed)
Occupational Therapy Treatment Patient Details Name: Diane Stevenson MRN: [id number] DOB: 1929-09-02 Today's Date: 08/23/2011  OT Assessment/Plan OT Assessment/Plan Comments on Treatment Session: Husband guided treatment session and demonstrated understanding of THP during ADL with AE and DME. Pt with apparent memory impairment.?medications. unable to recall any hip precautions and had difficulty with problem solving. Pt appropriate for D/C home with HHOT. OT Plan: Discharge plan remains appropriate OT Frequency: Min 2X/week Follow Up Recommendations: Home health OT;Supervision - Intermittent Equipment Recommended: Other (comment) (hip kit) OT Goals Acute Rehab OT Goals OT Goal Formulation: With patient ADL Goals Pt Will Perform Grooming: with set-up;with supervision;Standing at sink ADL Goal: Grooming - Progress: Met Pt Will Perform Lower Body Bathing: with set-up;with supervision;Sit to stand from chair ADL Goal: Lower Body Bathing - Progress: Progressing toward goals Pt Will Perform Lower Body Dressing: with set-up;with supervision;Sit to stand from chair;with adaptive equipment ADL Goal: Lower Body Dressing - Progress: Progressing toward goals Pt Will Transfer to Toilet: with supervision;with DME;Ambulation;3-in-1 ADL Goal: Toilet Transfer - Progress: Progressing toward goals Pt Will Perform Toileting - Hygiene: with set-up;Sit to stand from 3-in-1/toilet ADL Goal: Toileting - Hygiene - Progress: Met Pt Will Perform Tub/Shower Transfer: Shower transfer;with supervision;with DME;Ambulation;Shower seat with back;Maintaining hip precautions ADL Goal: Web designer - Progress: Progressing toward goals Additional ADL Goal #1: Pt. will recall 3 hip precautions ADL Goal: Additional Goal #1 - Progress: Progressing toward goals  OT Treatment Precautions/Restrictions  Precautions Precautions: Posterior Hip Precaution Booklet Issued: Yes (comment) Precaution Comments: unable to  recall precautions. Restrictions Weight Bearing Restrictions: Yes RLE Weight Bearing: Weight bearing as tolerated   ADL ADL Lower Body Bathing: Simulated;Minimal assistance;Other (comment) (husband assisting. Max vc for hip precautions) Where Assessed - Lower Body Bathing: Sit to stand from chair;Unsupported Lower Body Dressing: Performed;Moderate assistance;Other (comment) (educated on use of AE for dressing. Max vc) Lower Body Dressing Details (indicate cue type and reason): husband indep with bathing/dressing Where Assessed - Lower Body Dressing: Sit to stand from chair Toilet Transfer: Performed;Minimal assistance;Other (comment) (husband leading transfer) Statistician Method: Proofreader: Set designer - Clothing Manipulation: Performed;Modified independent Where Assessed - Toileting Clothing Manipulation: Standing Toileting - Hygiene: Performed;Set up Where Assessed - Toileting Hygiene: Sit to stand from 3-in-1 or toilet Tub/Shower Transfer: Not assessed ADL Comments: Husband guided tx session with transfers and ADL. Reviewed use of AE and DME. Mobility  Bed Mobility Bed Mobility: Yes Supine to Sit: 3: Mod assist;With rails;Other (comment) (with husband assisting) Sitting - Scoot to Edge of Bed: 6: Modified independent (Device/Increase time) Transfers Transfers: Yes Sit to Stand: 4: Min assist;From elevated surface;With upper extremity assist;From chair/3-in-1;From bed Stand to Sit: 4: Min assist;With upper extremity assist;To chair/3-in-1 Exercises    End of Session OT - End of Session Equipment Utilized During Treatment: Gait belt Activity Tolerance: Patient tolerated treatment well Patient left: in chair;with call bell in reach;with family/visitor present Nurse Communication: Mobility status for transfers General Behavior During Session: [facility] for tasks performed Cognition: Impaired, at baseline (noted memory impairment. Unable  to recall any THP. )  Juwon Scripter,HILLARY  08/23/2011, 10:33 AM Luisa Dago, OTR/L  [phone] 08/23/2011

## 2011-08-23 NOTE — Progress Notes (Signed)
ANTICOAGULATION CONSULT NOTE - Initial Consult  Pharmacy Consult for coumadin Indication: VTE prophylaxis  Allergies  Allergen Reactions  . Celebrex (Celecoxib) Hives and Swelling    Caused lips to swell.  . Sulfur Hives and Swelling    Caused lips to swell  . Ibuprofen Itching and Swelling    Vital Signs: Temp: 99.3 F (37.4 C) (03/30 0608) BP: 128/76 mmHg (03/30 0608) Pulse Rate: 94  (03/30 0608)  Labs:  Basename 08/23/11 0500 08/22/11 0640 08/21/11 0520  HGB 7.3* 7.9* --  HCT 23.2* 24.4* 22.7*  PLT 194 290 210  APTT -- -- --  LABPROT 17.3* 16.8* 16.6*  INR 1.39 1.34 1.32  HEPARINUNFRC -- -- --  CREATININE -- -- 0.68  CKTOTAL -- -- --  CKMB -- -- --  TROPONINI -- -- --     Medications:  Scheduled:     . cyanocobalamin  500 mcg Oral Daily  . glimepiride  2 mg Oral QAC breakfast  . insulin aspart  0-15 Units Subcutaneous TID WC  . metFORMIN  1,000 mg Oral BID WC  . patient's guide to using coumadin book   Does not apply Once  . ramipril  10 mg Oral Daily  . warfarin  2.5 mg Oral ONCE-1800  . Warfarin - Pharmacist Dosing Inpatient   Does not apply q1800    Assessment: 82 YOF s/p right total hip arthroplasty on coumadin for VTE prophylaxis. Baseline INR 1.06 on 3/20, not on anticoagulants prior to admission. INR today 1.39  Goal of Therapy:  INR 1.5-2   Plan:  - coumadin 4 mg po x 1 - f/u daily INR  Mickeal Skinner 08/23/2011,10:53 AM

## 2011-08-23 NOTE — Progress Notes (Signed)
Physical Therapy Treatment Patient Details Name: Diane Stevenson MRN: [id number] DOB: 1929-11-02 Today's Date: 08/23/2011  PT Assessment/Plan  PT - Assessment/Plan Comments on Treatment Session: Pt tolerated treatment much better today and was able to ambulate and perform stairs.  Continue to progress with PT. PT Plan: Discharge plan remains appropriate;Frequency remains appropriate PT Frequency: 7X/week Follow Up Recommendations: Home health PT;Supervision/Assistance - 24 hour Equipment Recommended: None recommended by PT PT Goals  Acute Rehab PT Goals PT Goal Formulation: With patient Time For Goal Achievement: 7 days Pt will go Supine/Side to Sit: with modified independence PT Goal: Supine/Side to Sit - Progress: Progressing toward goal Pt will go Sit to Supine/Side: with modified independence PT Goal: Sit to Supine/Side - Progress: Progressing toward goal Pt will go Sit to Stand: with modified independence PT Goal: Sit to Stand - Progress: Progressing toward goal Pt will go Stand to Sit: with modified independence PT Goal: Stand to Sit - Progress: Progressing toward goal Pt will Ambulate: >150 feet;with modified independence;with rolling walker PT Goal: Ambulate - Progress: Progressing toward goal Pt will Go Up / Down Stairs: 3-5 stairs;with min assist;with rail(s);with least restrictive assistive device PT Goal: Up/Down Stairs - Progress: Progressing toward goal Pt will Perform Home Exercise Program: Independently PT Goal: Perform Home Exercise Program - Progress: Progressing toward goal Additional Goals Additional Goal #1: pt will verbalize 3/3 hip precautions.   PT Goal: Additional Goal #1 - Progress: Progressing toward goal  PT Treatment Precautions/Restrictions  Precautions Precautions: Posterior Hip Precaution Booklet Issued: No Precaution Comments: pt recalled 1/3 precautions, vc's given for other 2 Required Braces or Orthoses: No Restrictions Weight Bearing  Restrictions: Yes RLE Weight Bearing: Weight bearing as tolerated Mobility (including Balance) Bed Mobility Bed Mobility: Yes Supine to Sit: 4: Min assist;With rails Supine to Sit Details (indicate cue type and reason): vc's for precautions.  Pt scared to move for breaking precautins so described the different scenarios in which she is safe getting up vs positions that break precautions Sitting - Scoot to Edge of Bed: 6: Modified independent (Device/Increase time) Transfers Transfers: Yes Sit to Stand: 5: Supervision;From bed;With upper extremity assist Stand to Sit: 5: Supervision;To chair/3-in-1;With armrests;With upper extremity assist Ambulation/Gait Ambulation/Gait: Yes Ambulation/Gait Assistance: 5: Supervision Ambulation/Gait Assistance Details (indicate cue type and reason): pt very slow but sequencing appropriately, vc's for trunk extension Ambulation Distance (Feet): 75 Feet Assistive device: Rolling walker Gait Pattern: Step-through pattern;Decreased step length - right;Decreased step length - left;Decreased stride length;Decreased hip/knee flexion - right Stairs: Yes Stairs Assistance: 4: Min assist Stairs Assistance Details (indicate cue type and reason): pt was safest using one HR on the right (which she has at home) and HHA on left and facing fwd.  She also tried facing the rail but was not as safe with this and putting too much wt through rail Stair Management Technique: One rail Right;Forwards Number of Stairs: 10  Height of Stairs: 6  Wheelchair Mobility Wheelchair Mobility: No  Posture/Postural Control Posture/Postural Control: No significant limitations Balance Balance Assessed: Yes Static Standing Balance Static Standing - Balance Support: No upper extremity supported Static Standing - Level of Assistance: 5: Stand by assistance Exercise  Total Joint Exercises Ankle Circles/Pumps: AROM;5 reps;Both;Seated Quad Sets: AROM;10 reps;Right;Seated End of  Session PT - End of Session Equipment Utilized During Treatment: Gait belt Activity Tolerance: Patient tolerated treatment well Patient left: in chair;with call bell in reach;with family/visitor present Nurse Communication: Mobility status for ambulation General Behavior During Session: [facility] for tasks performed  Cognition: Impaired, at baseline [place], PT  Acute Rehab Services  [phone] Diane Stevenson   08/23/2011, 2:24 PM

## 2011-08-23 NOTE — Progress Notes (Signed)
PT Treatment Note  08/23/11 1550  PT Visit Information  Last PT Received On 08/23/11  Precautions  Precautions Posterior Hip  Precaution Booklet Issued No  Precaution Comments pt recalled 2/3 precautions and further explanation given of why these positions could cause dislocation, to try to help pt in remembering them  Required Braces or Orthoses No  Restrictions  Weight Bearing Restrictions Yes  RLE Weight Bearing WBAT  Bed Mobility  Bed Mobility No  Transfers  Transfers No  Ambulation/Gait  Ambulation/Gait No (pt just returned to bed)  Exercises  Exercises Total Joint  Total Joint Exercises  Ankle Circles/Pumps AROM;20 reps;Both;Supine  Quad Sets AROM;Both;Strengthening;15 reps;Supine  Gluteal Sets AROM;Strengthening;Both;10 reps;Supine  Short Arc Quad AROM;Right;Strengthening;10 reps;Supine  Heel Slides AROM;10 reps;Right;Strengthening;Supine  Hip ABduction/ADduction AAROM;Strengthening;Right;10 reps;Supine  Straight Leg Raises AAROM;Strengthening;Right;10 reps;Supine;Other (comment) (10 degrees only)  PT - End of Session  Activity Tolerance Patient tolerated treatment well  Patient left in bed;with call bell in reach;with family/visitor present  General  Behavior During Session [facility] for tasks performed  Cognition Impaired, at baseline  PT - Assessment/Plan  Comments on Treatment Session Pt performed exercises second treatment today as she had just returned to bed with nsg.  Will plan to ambulate with pt one more time on the morning before d/c.  Pt is improving in recall of precautions but still needs cueing.  Continue to follow.  PT Plan Discharge plan remains appropriate;Frequency remains appropriate  PT Frequency 7X/week  Follow Up Recommendations Home health PT;Supervision/Assistance - 24 hour  Equipment Recommended None recommended by PT  Acute Rehab PT Goals  PT Goal Formulation With patient  Time For Goal Achievement 7 days  Pt will go Supine/Side to Sit  with modified independence  PT Goal: Supine/Side to Sit - Progress Other (comment) (NT)  Pt will go Sit to Supine/Side with modified independence  PT Goal: Sit to Supine/Side - Progress Other (comment) (NT)  Pt will go Sit to Stand with modified independence  PT Goal: Sit to Stand - Progress Other (comment) (NT)  Pt will go Stand to Sit with modified independence  PT Goal: Stand to Sit - Progress Other (comment) (NT)  Pt will Ambulate >150 feet;with modified independence;with rolling walker  PT Goal: Ambulate - Progress Other (comment) (NT)  Pt will Go Up / Down Stairs 3-5 stairs;with min assist;with rail(s);with least restrictive assistive device  PT Goal: Up/Down Stairs - Progress Other (comment) (NT)  Pt will Perform Home Exercise Program Independently  PT Goal: Perform Home Exercise Program - Progress Progressing toward goal  Additional Goals  Additional Goal #1 pt will verbalize 3/3 hip precautions.    PT Goal: Additional Goal #1 - Progress Progressing toward goal    Lyanne Co, PT  Acute Rehab Services  [phone]

## 2011-08-24 DIAGNOSIS — D62 Acute posthemorrhagic anemia: Secondary | ICD-10-CM

## 2011-08-24 LAB — CBC
HCT: 22.3 % — ABNORMAL LOW (ref 36.0–46.0)
Hemoglobin: 7.1 g/dL — ABNORMAL LOW (ref 12.0–15.0)
MCH: 27.8 pg (ref 26.0–34.0)
MCHC: 31.8 g/dL (ref 30.0–36.0)
MCV: 87.5 fL (ref 78.0–100.0)
RDW: 16.3 % — ABNORMAL HIGH (ref 11.5–15.5)

## 2011-08-24 LAB — GLUCOSE, CAPILLARY

## 2011-08-24 MED ORDER — WARFARIN SODIUM 2.5 MG PO TABS: ORAL_TABLET | ORAL | Status: DC

## 2011-08-24 MED ORDER — METHOCARBAMOL 500 MG PO TABS: 500.0000 mg | ORAL_TABLET | Freq: Four times a day (QID) | ORAL | Status: AC | PRN

## 2011-08-24 MED ORDER — OXYCODONE HCL 5 MG PO TABS: 5.0000 mg | ORAL_TABLET | ORAL | Status: AC | PRN

## 2011-08-24 MED ORDER — FERROUS GLUCONATE 216 MG PO TABS: 216.0000 mg | ORAL_TABLET | Freq: Two times a day (BID) | ORAL | Status: DC

## 2011-08-24 NOTE — Progress Notes (Signed)
   CARE MANAGEMENT NOTE 08/24/2011  Patient:  Diane Stevenson, Diane Stevenson   Account Number:  [id number]  Date Initiated:  08/22/2011  Documentation initiated by:  Vance Peper  Subjective/Objective Assessment:   78 yr old female s/p right total hip arthroplasty     Action/Plan:   Spoke with patient's husband(she was asleep). Preoperatively setup with [facility], noc hanges. Husband states she is progressing slowly, may need rehab.Has rolling walker and 3in1. Will follow   Anticipated DC Date:  08/23/2011   Anticipated DC Plan:  HOME W HOME HEALTH SERVICES  In-house referral  Clinical Social Worker      DC Planning Services  CM consult      [facility] Choice  HOME HEALTH   Choice offered to / List presented to:  C-3 Spouse        HH arranged  HH-1 RN  HH-2 PT      [facility] agency  [employer]   Status of service:  Completed, signed off Medicare Important Message given?   (If response is "NO", the following Medicare IM given date fields will be blank) Date Medicare IM given:   Date Additional Medicare IM given:    Discharge Disposition:  HOME W HOME HEALTH SERVICES  Per UR Regulation:    If discussed at Long Length of Stay Meetings, dates discussed:    Comments:  08/24/2011 1200 Faxed new orders for lab draw to be called Dr. Turner Daniels, facesheet, d/c summary to [place]. Made agency aware of d/c today. Isidoro Donning RN CCM Case Mgmt phone [phone]

## 2011-08-24 NOTE — Progress Notes (Signed)
Diane Stevenson, [place] [phone] 08/24/2011

## 2011-08-24 NOTE — Progress Notes (Signed)
Physical Therapy Treatment and d/c summary Patient Details Name: Diane Stevenson MRN: [id number] DOB: 1930-03-07 Today's Date: 08/24/2011  PT Assessment/Plan  PT - Assessment/Plan Comments on Treatment Session: Pt set for d/c home today, mobility appropriate for amount of help that she has at home.  D/c from acute PT to be followed by HHPT. PT Plan: All goals met and education completed, patient dischaged from PT services PT Frequency: 7X/week Follow Up Recommendations: Home health PT;Supervision/Assistance - 24 hour Equipment Recommended: None recommended by PT PT Goals  Acute Rehab PT Goals PT Goal Formulation: With patient Time For Goal Achievement: 7 days Pt will go Supine/Side to Sit: with modified independence PT Goal: Supine/Side to Sit - Progress: Met Pt will go Sit to Supine/Side: with modified independence PT Goal: Sit to Supine/Side - Progress: Met Pt will go Sit to Stand: with modified independence PT Goal: Sit to Stand - Progress: Met Pt will go Stand to Sit: with modified independence PT Goal: Stand to Sit - Progress: Met Pt will Ambulate: 51 - 150 feet;with modified independence;with rolling walker PT Goal: Ambulate - Progress: Met Pt will Go Up / Down Stairs: 3-5 stairs;with min assist;with rail(s);with least restrictive assistive device PT Goal: Up/Down Stairs - Progress: Met Pt will Perform Home Exercise Program: with supervision, verbal cues required/provided PT Goal: Perform Home Exercise Program - Progress: Met Additional Goals Additional Goal #1: pt will verbalize 3/3 hip precautions.   PT Goal: Additional Goal #1 - Progress: Partly met  PT Treatment Precautions/Restrictions  Precautions Precautions: Posterior Hip Precaution Booklet Issued: No Precaution Comments: pt recalled 2/3 precautions correctly and remembered something about not bending at the hip but could not remember the degree Required Braces or Orthoses: No Restrictions Weight Bearing  Restrictions: No RLE Weight Bearing: Weight bearing as tolerated Mobility (including Balance) Bed Mobility Bed Mobility: Yes Supine to Sit: 6: Modified independent (Device/Increase time);HOB flat Supine to Sit Details (indicate cue type and reason): required increased time but performed independently Sitting - Scoot to Edge of Bed: 6: Modified independent (Device/Increase time) Transfers Transfers: Yes Sit to Stand: 6: Modified independent (Device/Increase time);From bed;From chair/3-in-1;With upper extremity assist Stand to Sit: 6: Modified independent (Device/Increase time);To chair/3-in-1;To bed;With upper extremity assist Ambulation/Gait Ambulation/Gait: Yes Ambulation/Gait Assistance: 6: Modified independent (Device/Increase time) Ambulation/Gait Assistance Details (indicate cue type and reason): improving with safety with gait as well as speed.  Safe use of RW Ambulation Distance (Feet): 100 Feet Assistive device: Rolling walker Gait Pattern: Step-through pattern;Decreased step length - right;Decreased step length - left;Decreased stride length;Decreased hip/knee flexion - right Stairs: Yes Stairs Assistance: 4: Min assist Stairs Assistance Details (indicate cue type and reason): HHA given to LUE, pt performed stairs with less difficulty today Stair Management Technique: One rail Right;Forwards Number of Stairs: 5  Height of Stairs: 6  Wheelchair Mobility Wheelchair Mobility: No  Posture/Postural Control Posture/Postural Control: No significant limitations Balance Balance Assessed: No Static Standing Balance Static Standing - Balance Support: No upper extremity supported Static Standing - Level of Assistance: 5: Stand by assistance Exercise  Total Joint Exercises Ankle Circles/Pumps: AROM;20 reps;Both;Supine Quad Sets: AROM;Both;Strengthening;15 reps;Seated Heel Slides: AROM;Strengthening;Right;Seated;10 reps (short ROM for hip prec) Hip ABduction/ADduction:  AROM;Strengthening;Right;10 reps;Seated Straight Leg Raises: AAROM;Strengthening;Right;10 reps;Seated (10 deg to keep hip prec) Long Arc Quad: AROM;Right;Strengthening;10 reps;Seated End of Session PT - End of Session Equipment Utilized During Treatment: Gait belt Activity Tolerance: Patient tolerated treatment well Patient left: in chair;with call bell in reach Nurse Communication: Other (comment) (RN comm for  d/c) General Behavior During Session: [facility] for tasks performed Cognition: Impaired, at baseline [facility], PT  Acute Rehab Services  [phone] Lyanne Co 08/24/2011, 10:36 AM

## 2011-08-24 NOTE — Discharge Summary (Signed)
Patient ID: SALAYA HOLTROP MRN: [id number] DOB/AGE: 76-Feb-1931 76 y.o.  Admit date: 08/20/2011 Discharge date: 08/24/2011  Admission Diagnoses:  Principal Problem:  *Hip arthritis right Active Problems:  Acute blood loss anemia   Discharge Diagnoses:  Same  Past Medical History  Diagnosis Date  . Breast cancer 08/04/1995    left  . Hypertension   . DJD (degenerative joint disease)   . Type 2 diabetes mellitus   . Dyspareunia   . Diabetes mellitus     Surgeries: Procedure(s):Right TOTAL HIP ARTHROPLASTY on 08/20/2011   Discharged Condition: Improved  Hospital Course: DANY HARTEN is an 78 y.o. female who was admitted 08/20/2011 for operative treatment o Right fHip arthritis. Patient has severe unremitting pain that affects sleep, daily activities, and work/hobbies. After pre-op clearance the patient was taken to the operating room on 08/20/2011 and underwent  Procedure(s):Right TOTAL HIP ARTHROPLASTY.    Patient was given perioperative antibiotics: Anti-infectives     Start     Dose/Rate Route Frequency Ordered Stop   08/20/11 1407   cefUROXime (ZINACEF) injection  Status:  Discontinued          As needed 08/20/11 1407 08/20/11 1523   08/19/11 1407   ceFAZolin (ANCEF) IVPB 2 g/50 mL premix  Status:  Discontinued        2 g 100 mL/hr over 30 Minutes Intravenous 60 min pre-op 08/19/11 1407 08/20/11 1706           Patient was given sequential compression devices, early ambulation, and chemoprophylaxis to prevent DVT.  Patient benefited maximally from hospital stay and there were no complications.    Recent vital signs: Patient Vitals for the past 24 hrs:  BP Temp Temp src Pulse Resp SpO2  08/24/11 0609 128/68 mmHg 99.5 F (37.5 C) - 88  18  95 %  September 20, 2011 2121 133/71 mmHg 99.4 F (37.4 C) - 91  18  95 %  09-20-11 1349 138/73 mmHg 98.1 F (36.7 C) Oral 92  16  95 %     Recent laboratory studies:  Basename 08/24/11 0710 09/20/2011 0500  WBC 7.8 7.8  HGB 7.1*  7.3*  HCT 22.3* 23.2*  PLT 242 194  NA -- --  K -- --  CL -- --  CO2 -- --  BUN -- --  CREATININE -- --  GLUCOSE -- --  INR 1.52* 1.39  CALCIUM -- --     Discharge Medications:   Medication List  As of 08/24/2011 10:37 AM   STOP taking these medications         aspirin EC 81 MG tablet         TAKE these medications         CALCIUM 600 + D PO   Take 1 tablet by mouth 2 (two) times daily.      cholecalciferol 1000 UNITS tablet   Commonly known as: VITAMIN D   Take 1,000 Units by mouth daily.      cyanocobalamin 500 MCG tablet   Take 500 mcg by mouth daily.      ferrous gluconate 216 MG tablet   Commonly known as: FERGON   Take 1 tablet (216 mg total) by mouth 2 (two) times daily with a meal.      glimepiride 4 MG tablet   Commonly known as: AMARYL   Take 2 mg by mouth daily before breakfast.      metFORMIN 1000 MG tablet   Commonly known as: GLUCOPHAGE   Take  1,000 mg by mouth 2 (two) times daily with a meal.      methocarbamol 500 MG tablet   Commonly known as: ROBAXIN   Take 1 tablet (500 mg total) by mouth every 6 (six) hours as needed.      oxyCODONE 5 MG immediate release tablet   Commonly known as: Oxy IR/ROXICODONE   Take 1-2 tablets (5-10 mg total) by mouth every 4 (four) hours as needed for pain.      ramipril 10 MG capsule   Commonly known as: ALTACE   Take 10 mg by mouth daily.      warfarin 2.5 MG tablet   Commonly known as: COUMADIN   One daily unless otherwise directed x 19 days. Shoot for INR of 2.0.            Diagnostic Studies: Dg Chest 2 View  08/13/2011  *RADIOLOGY REPORT*  Clinical Data: 78 year old female preoperative study.  Hypertension and diabetes.  CHEST - 2 VIEW  Comparison: 06/16/2006.  Findings: Stable postoperative changes to the left chest wall and axilla.  Stable ossific fragment adjacent to the proximal left humerus.  Normal lung volumes.  Cardiac size and mediastinal contours are within normal limits.  Visualized  tracheal air column is within normal limits.  The lungs are clear. No acute osseous abnormality identified.    Stable tiny right apical calcified granuloma.  IMPRESSION: No acute cardiopulmonary abnormality.  Original Report Authenticated By: Harley Hallmark, M.D.   Dg Pelvis Portable  08/20/2011  *RADIOLOGY REPORT*  Clinical Data: Postop right hip replacement.  PORTABLE PELVIS  Comparison: CT 04/05/2007  Findings: Changes of bilateral hip replacements.  SI joints are symmetric.  No hardware or bony complicating feature. No acute bony abnormality.  Specifically, no fracture, subluxation, or dislocation.  Soft tissues are intact.  IMPRESSION: Bilateral hip replacements. No acute bony abnormality.  Original Report Authenticated By: Cyndie Chime, M.D.    Disposition:   Discharge Orders    Future Orders Please Complete By Expires   Call MD / Call 911      Comments:   If you experience chest pain or shortness of breath, CALL 911 and be transported to the hospital emergency room.  If you develope a fever above 101 F, pus (white drainage) or increased drainage or redness at the wound, or calf pain, call your surgeon's office.   Weight Bearing as taught in Physical Therapy      Comments:   Use a walker or crutches as instructed.   Discharge instructions      Comments:   Ambulate Wgt bearing as tolerated on your right hip with a walker.   Follow the hip precautions as taught in Physical Therapy         Follow-up Information    Follow up with [name], MD. Schedule an appointment as soon as possible for a visit in 10 days.   Contact information:   [facility] [address] [place] [address] [phone]           Signed: Matthew Folks 08/24/2011, 10:37 AM

## 2011-08-24 NOTE — Progress Notes (Signed)
Subjective: 4 Days Post-Op Procedure(s) (LRB): TOTAL HIP ARTHROPLASTY (Right) Patient reports pain as 2 on 0-10 scale.   Taking po/voiding ok. No dizziness.  Objective: Vital signs in last 24 hours: Temp:  [98.1 F (36.7 C)-99.5 F (37.5 C)] 99.5 F (37.5 C) (03/31 0609) Pulse Rate:  [88-92] 88  (03/31 0609) Resp:  [16-18] 18  (03/31 0609) BP: (128-138)/(68-73) 128/68 mmHg (03/31 0609) SpO2:  [95 %] 95 % (03/31 0609)  Intake/Output from previous day: 03/30 0701 - 03/31 0700 In: 240 [P.O.:240] Out: -  Intake/Output this shift:     Basename 08/24/11 0710 08/23/11 0500 08/22/11 0640  HGB 7.1* 7.3* 7.9*    Basename 08/24/11 0710 08/23/11 0500  WBC 7.8 7.8  RBC 2.55* 2.64*  HCT 22.3* 23.2*  PLT 242 194   No results found for this basename: NA:2,K:2,CL:2,CO2:2,BUN:2,CREATININE:2,GLUCOSE:2,CALCIUM:2 in the last 72 hours  Basename 08/24/11 0710 08/23/11 0500  LABPT -- --  INR 1.52* 1.39  Right hip exam:  Neurologically intact Neurovascular intact Sensation intact distally Intact pulses distally Dorsiflexion/Plantar flexion intact Incision: scant drainage Compartment soft  Assessment/Plan: 4 Days Post-Op Procedure(s) (LRB): TOTAL HIP ARTHROPLASTY (Right) Acute blood loss anemia    No sx's Plan: D/C IV fluids Discharge home with home health FE SO4 at home. F/U Dr Turner Daniels in 2 weeks.  Tasnim Balentine G 08/24/2011, 10:21 AM

## 2012-08-17 ENCOUNTER — Ambulatory Visit (INDEPENDENT_AMBULATORY_CARE_PROVIDER_SITE_OTHER): Payer: Medicare Other | Admitting: General Surgery

## 2012-08-19 ENCOUNTER — Encounter (HOSPITAL_COMMUNITY): Payer: Self-pay | Admitting: *Deleted

## 2012-08-19 ENCOUNTER — Emergency Department (HOSPITAL_COMMUNITY): Payer: Medicare Other

## 2012-08-19 ENCOUNTER — Emergency Department (HOSPITAL_COMMUNITY)
Admission: EM | Admit: 2012-08-19 | Discharge: 2012-08-19 | Disposition: A | Discharge status: Home / Self Care | Payer: Medicare Other | Attending: Emergency Medicine | Admitting: Emergency Medicine

## 2012-08-19 DIAGNOSIS — Y9301 Activity, walking, marching and hiking: Secondary | ICD-10-CM | POA: Insufficient documentation

## 2012-08-19 DIAGNOSIS — S52599A Other fractures of lower end of unspecified radius, initial encounter for closed fracture: Secondary | ICD-10-CM | POA: Insufficient documentation

## 2012-08-19 DIAGNOSIS — Y929 Unspecified place or not applicable: Secondary | ICD-10-CM | POA: Insufficient documentation

## 2012-08-19 DIAGNOSIS — E119 Type 2 diabetes mellitus without complications: Secondary | ICD-10-CM | POA: Insufficient documentation

## 2012-08-19 DIAGNOSIS — Z853 Personal history of malignant neoplasm of breast: Secondary | ICD-10-CM | POA: Insufficient documentation

## 2012-08-19 DIAGNOSIS — Z8739 Personal history of other diseases of the musculoskeletal system and connective tissue: Secondary | ICD-10-CM | POA: Insufficient documentation

## 2012-08-19 DIAGNOSIS — W19XXXA Unspecified fall, initial encounter: Secondary | ICD-10-CM

## 2012-08-19 DIAGNOSIS — S5291XA Unspecified fracture of right forearm, initial encounter for closed fracture: Secondary | ICD-10-CM

## 2012-08-19 DIAGNOSIS — Z79899 Other long term (current) drug therapy: Secondary | ICD-10-CM | POA: Insufficient documentation

## 2012-08-19 DIAGNOSIS — I10 Essential (primary) hypertension: Secondary | ICD-10-CM | POA: Insufficient documentation

## 2012-08-19 DIAGNOSIS — W010XXA Fall on same level from slipping, tripping and stumbling without subsequent striking against object, initial encounter: Secondary | ICD-10-CM | POA: Insufficient documentation

## 2012-08-19 DIAGNOSIS — Z87448 Personal history of other diseases of urinary system: Secondary | ICD-10-CM | POA: Insufficient documentation

## 2012-08-19 LAB — CBC WITH DIFFERENTIAL/PLATELET
Basophils Absolute: 0 10*3/uL (ref 0.0–0.1)
Eosinophils Relative: 1 % (ref 0–5)
HCT: 36.6 % (ref 36.0–46.0)
Lymphocytes Relative: 14 % (ref 12–46)
Lymphs Abs: 1.4 10*3/uL (ref 0.7–4.0)
MCV: 89.1 fL (ref 78.0–100.0)
Monocytes Absolute: 0.6 10*3/uL (ref 0.1–1.0)
Neutro Abs: 8 10*3/uL — ABNORMAL HIGH (ref 1.7–7.7)
RBC: 4.11 MIL/uL (ref 3.87–5.11)
RDW: 14.4 % (ref 11.5–15.5)
WBC: 10.1 10*3/uL (ref 4.0–10.5)

## 2012-08-19 LAB — BASIC METABOLIC PANEL
CO2: 24 mEq/L (ref 19–32)
Calcium: 10.3 mg/dL (ref 8.4–10.5)
Chloride: 97 mEq/L (ref 96–112)
Creatinine, Ser: 0.75 mg/dL (ref 0.50–1.10)
Glucose, Bld: 183 mg/dL — ABNORMAL HIGH (ref 70–99)
Sodium: 135 mEq/L (ref 135–145)

## 2012-08-19 LAB — PROTIME-INR
INR: 1.05 (ref 0.00–1.49)
Prothrombin Time: 13.6 seconds (ref 11.6–15.2)

## 2012-08-19 MED ORDER — MORPHINE SULFATE 4 MG/ML IJ SOLN: 4.0000 mg | Freq: Once | INTRAMUSCULAR | Status: DC

## 2012-08-19 MED ORDER — ACETAMINOPHEN-CODEINE #3 300-30 MG PO TABS: 1.0000 | ORAL_TABLET | Freq: Four times a day (QID) | ORAL | Status: DC | PRN

## 2012-08-19 MED ORDER — HYDROCODONE-ACETAMINOPHEN 5-325 MG PO TABS
1.0000 | ORAL_TABLET | Freq: Once | ORAL | Status: AC
  Administered 2012-08-19: 1 via ORAL
  Filled 2012-08-19: qty 1

## 2012-08-19 NOTE — ED Notes (Signed)
To ED via EMS for eval after tripping and falling. Right wrist swelling and right elbow pain. No LOC. LSB. Takes ASA daily

## 2012-08-19 NOTE — ED Provider Notes (Signed)
History     CSN: [id number]  Arrival date & time 08/19/12  1016   First MD Initiated Contact with Patient 08/19/12 1027      Chief Complaint  Patient presents with  . Fall  . Wrist Pain    (Consider location/radiation/quality/duration/timing/severity/associated sxs/prior treatment) Patient is a 79 y.o. female presenting with fall and wrist pain. The history is provided by the patient.  Fall The accident occurred less than 1 hour ago. The fall occurred while walking. She landed on concrete. The point of impact was the head, right wrist and left wrist. The pain is present in the right wrist. The pain is moderate. She was ambulatory at the scene. There was no drug use involved in the accident. Pertinent negatives include no visual change, no numbness, no abdominal pain, no nausea, no vomiting, no hematuria, no headaches and no loss of consciousness. Treatment on scene includes a c-collar and a backboard.  Wrist Pain Pertinent negatives include no chest pain, no abdominal pain, no headaches and no shortness of breath.    Past Medical History  Diagnosis Date  . Breast cancer 08/04/1995    left  . Hypertension   . DJD (degenerative joint disease)   . Type 2 diabetes mellitus   . Dyspareunia   . Diabetes mellitus     Past Surgical History  Procedure Laterality Date  . Appendectomy    . Breast surgery  1998     Left Breast  . Breast surgery  1993    Right Breast  . Breast surgery  1997    Left Breast  . Partial hip arthroplasty  1998    Left side  . Foot arthrodesis, modified mcbride      1984  . Bone spur      1984  . Joint replacement      rt knee  . Total hip arthroplasty  08/20/2011    Procedure: TOTAL HIP ARTHROPLASTY;  Surgeon: [name], MD;  Location: [facility] OR;  Service: Orthopedics;  Laterality: Right;  DEPUY/PINNACLE CUP,SUMMIT BASIC STEM    Family History  Problem Relation Age of Onset  . Heart disease Brother     History  Substance Use Topics  .  Smoking status: Never Smoker   . Smokeless tobacco: Never Used  . Alcohol Use: No    OB History   Grav Para Term Preterm Abortions TAB SAB Ect Mult Living                  Review of Systems  Constitutional: Negative for activity change.  HENT: Negative for facial swelling and neck pain.   Respiratory: Negative for cough, shortness of breath and wheezing.   Cardiovascular: Negative for chest pain.  Gastrointestinal: Negative for nausea, vomiting, abdominal pain, diarrhea, constipation, blood in stool and abdominal distention.  Genitourinary: Negative for hematuria and difficulty urinating.  Musculoskeletal: Positive for myalgias and arthralgias.  Skin: Negative for color change.  Neurological: Negative for loss of consciousness, speech difficulty, numbness and headaches.  Hematological: Bruises/bleeds easily.  Psychiatric/Behavioral: Negative for confusion.    Allergies  Celebrex; Sulfur; and Ibuprofen  Home Medications   Current Outpatient Rx  Name  Route  Sig  Dispense  Refill  . Calcium Carbonate-Vitamin D (CALCIUM 600 + D PO)   Oral   Take 1 tablet by mouth 2 (two) times daily.         . cholecalciferol (VITAMIN D) 1000 UNITS tablet   Oral   Take 1,000 Units by  mouth daily.         . cyanocobalamin 500 MCG tablet   Oral   Take 500 mcg by mouth daily.         . ferrous gluconate (FERGON) 216 MG tablet   Oral   Take 1 tablet (216 mg total) by mouth 2 (two) times daily with a meal.   40 tablet   0   . glimepiride (AMARYL) 4 MG tablet   Oral   Take 2 mg by mouth daily before breakfast.         . metFORMIN (GLUCOPHAGE) 1000 MG tablet   Oral   Take 1,000 mg by mouth 2 (two) times daily with a meal.         . ramipril (ALTACE) 10 MG capsule   Oral   Take 10 mg by mouth daily.           BP 181/87  Pulse 61  Temp(Src) 97.2 F (36.2 C) (Oral)  Resp 12  SpO2 99%  Physical Exam  Nursing note and vitals reviewed. Constitutional: She is  oriented to person, place, and time. She appears well-developed.  HENT:  Head: Normocephalic and atraumatic.  No midline c-spine tenderness, pt able to turn head to 45 degrees bilaterally without any pain and able to flex neck to the chest and extend without any pain or neurologic symptoms.  Eyes: Conjunctivae and EOM are normal. Pupils are equal, round, and reactive to light.  Neck: Normal range of motion. Neck supple.  Cardiovascular: Normal rate, regular rhythm and normal heart sounds.   Pulmonary/Chest: Effort normal and breath sounds normal. No respiratory distress.  Abdominal: Soft. Bowel sounds are normal. She exhibits no distension. There is no tenderness. There is no rebound and no guarding.  Musculoskeletal:  Head to toe evaluation shows bleeding - right temple, right maxilla, no facial step offs, crepitus, no tenderness to palpation of the bilateral lower extremities, no gross deformities, no chest tenderness, no pelvic pain.  Left elbow and wrist swelling and tenderness to palpation noted. Neuro vascularly intact.    Neurological: She is alert and oriented to person, place, and time.  Skin: Skin is warm and dry.    ED Course  Procedures (including critical care time)  Labs Reviewed  CBC WITH DIFFERENTIAL  BASIC METABOLIC PANEL  PROTIME-INR   Ct Head Wo Contrast  08/19/2012  *RADIOLOGY REPORT*  Clinical Data: 79 year old female status post fall with right head injury.  Pain.  CT HEAD WITHOUT CONTRAST  Technique:  Contiguous axial images were obtained from the base of the skull through the vertex without contrast.  Comparison: 06/09/2006.  Findings: Mild right periorbital soft tissue thickening.  Visible right globe intact.  Visible bony right orbit intact.  Visualized paranasal sinuses and mastoids are clear.  Scalp soft tissues within normal limits elsewhere.  Calvarium stable and intact.  Insert pass calcs stable and normal for age cerebral volume.  No ventriculomegaly.   Mild for age white matter hypodensity not significantly changed. No midline shift, mass effect, or evidence of mass lesion.  No evidence of cortically based acute infarction identified.  No acute intracranial hemorrhage identified.  No suspicious intracranial vascular hyperdensity.  IMPRESSION: 1.  Right periorbital soft tissue swelling.  Visible bony right orbit intact. 2.  Stable mild for age nonspecific cerebral white matter changes.   Original Report Authenticated By: Erskine Speed, M.D.      No diagnosis found.    MDM  DDx includes: - Mechanical falls -  ICH - Fractures - Contusions - Soft tissue injury  Pt comes in post mechanical fall. She is on coumadin. Cspine cleared clinically. CT head ordered. Right wrist appears to have possible fracture - Xray ordered. Pain control for now.   LATE ENTRY:  Spoke with ortho on call, and a sugar tong was recommended. Ortho f/u provided.  Derwood Kaplan, MD 08/21/12 0111

## 2012-08-20 ENCOUNTER — Ambulatory Visit (INDEPENDENT_AMBULATORY_CARE_PROVIDER_SITE_OTHER): Payer: Medicare Other | Admitting: General Surgery

## 2012-10-28 ENCOUNTER — Encounter (INDEPENDENT_AMBULATORY_CARE_PROVIDER_SITE_OTHER): Payer: Self-pay

## 2012-12-06 ENCOUNTER — Emergency Department (HOSPITAL_COMMUNITY): Payer: Medicare Other

## 2012-12-06 ENCOUNTER — Emergency Department (HOSPITAL_COMMUNITY)
Admission: EM | Admit: 2012-12-06 | Discharge: 2012-12-07 | Disposition: A | Discharge status: Home / Self Care | Payer: Medicare Other | Attending: Emergency Medicine | Admitting: Emergency Medicine

## 2012-12-06 ENCOUNTER — Encounter (HOSPITAL_COMMUNITY): Payer: Self-pay

## 2012-12-06 DIAGNOSIS — Z96649 Presence of unspecified artificial hip joint: Secondary | ICD-10-CM | POA: Insufficient documentation

## 2012-12-06 DIAGNOSIS — X58XXXA Exposure to other specified factors, initial encounter: Secondary | ICD-10-CM | POA: Insufficient documentation

## 2012-12-06 DIAGNOSIS — Z79899 Other long term (current) drug therapy: Secondary | ICD-10-CM | POA: Insufficient documentation

## 2012-12-06 DIAGNOSIS — Z96659 Presence of unspecified artificial knee joint: Secondary | ICD-10-CM | POA: Insufficient documentation

## 2012-12-06 DIAGNOSIS — M545 Low back pain, unspecified: Secondary | ICD-10-CM | POA: Insufficient documentation

## 2012-12-06 DIAGNOSIS — Y939 Activity, unspecified: Secondary | ICD-10-CM | POA: Insufficient documentation

## 2012-12-06 DIAGNOSIS — S22080A Wedge compression fracture of T11-T12 vertebra, initial encounter for closed fracture: Secondary | ICD-10-CM

## 2012-12-06 DIAGNOSIS — E119 Type 2 diabetes mellitus without complications: Secondary | ICD-10-CM | POA: Insufficient documentation

## 2012-12-06 DIAGNOSIS — Z853 Personal history of malignant neoplasm of breast: Secondary | ICD-10-CM | POA: Insufficient documentation

## 2012-12-06 DIAGNOSIS — M1990 Unspecified osteoarthritis, unspecified site: Secondary | ICD-10-CM | POA: Insufficient documentation

## 2012-12-06 DIAGNOSIS — I10 Essential (primary) hypertension: Secondary | ICD-10-CM | POA: Insufficient documentation

## 2012-12-06 DIAGNOSIS — R350 Frequency of micturition: Secondary | ICD-10-CM | POA: Insufficient documentation

## 2012-12-06 DIAGNOSIS — Y929 Unspecified place or not applicable: Secondary | ICD-10-CM | POA: Insufficient documentation

## 2012-12-06 DIAGNOSIS — S22009A Unspecified fracture of unspecified thoracic vertebra, initial encounter for closed fracture: Secondary | ICD-10-CM | POA: Insufficient documentation

## 2012-12-06 LAB — CBC WITH DIFFERENTIAL/PLATELET
Basophils Relative: 0 % (ref 0–1)
Eosinophils Absolute: 0.1 10*3/uL (ref 0.0–0.7)
Eosinophils Relative: 1 % (ref 0–5)
Hemoglobin: 12.6 g/dL (ref 12.0–15.0)
Lymphs Abs: 1.1 10*3/uL (ref 0.7–4.0)
MCH: 31.3 pg (ref 26.0–34.0)
MCHC: 34.5 g/dL (ref 30.0–36.0)
MCV: 90.6 fL (ref 78.0–100.0)
Monocytes Absolute: 0.9 10*3/uL (ref 0.1–1.0)
Monocytes Relative: 10 % (ref 3–12)
RBC: 4.03 MIL/uL (ref 3.87–5.11)

## 2012-12-06 LAB — URINALYSIS, ROUTINE W REFLEX MICROSCOPIC
Bilirubin Urine: NEGATIVE
Hgb urine dipstick: NEGATIVE
Ketones, ur: 15 mg/dL — AB
Nitrite: NEGATIVE
Specific Gravity, Urine: 1.013 (ref 1.005–1.030)
pH: 6 (ref 5.0–8.0)

## 2012-12-06 LAB — POCT I-STAT, CHEM 8
Calcium, Ion: 1.22 mmol/L (ref 1.13–1.30)
Creatinine, Ser: 0.9 mg/dL (ref 0.50–1.10)
Glucose, Bld: 123 mg/dL — ABNORMAL HIGH (ref 70–99)
HCT: 40 % (ref 36.0–46.0)
Hemoglobin: 13.6 g/dL (ref 12.0–15.0)
TCO2: 26 mmol/L (ref 0–100)

## 2012-12-06 LAB — URINE MICROSCOPIC-ADD ON

## 2012-12-06 MED ORDER — DIAZEPAM 5 MG PO TABS
5.0000 mg | ORAL_TABLET | Freq: Once | ORAL | Status: AC
  Administered 2012-12-06: 5 mg via ORAL
  Filled 2012-12-06: qty 1

## 2012-12-06 MED ORDER — OXYCODONE-ACETAMINOPHEN 5-325 MG PO TABS
1.0000 | ORAL_TABLET | Freq: Once | ORAL | Status: AC
  Administered 2012-12-06: 1 via ORAL
  Filled 2012-12-06: qty 1

## 2012-12-06 NOTE — ED Notes (Signed)
Per EMS- pt from home, a/o 86F.  sts she has been having back pain x 7 days.  Has seen her pcp a week ago and today but still has had no relief from meds she was given.  Pt sts she cannot sleep and needs something stronger to help.

## 2012-12-06 NOTE — ED Provider Notes (Signed)
History    CSN: [id number] Arrival date & time 12/06/12  2046  First MD Initiated Contact with Patient 12/06/12 2040     Chief Complaint  Patient presents with  . Back Pain   HPI  Hx provided by pt and husband.  Pt is a 79 yo female with hx of HTN, diabetes and DJD who presents with continued low back pain.  Pain has been present for the past week.  She saw her PCP last week who did testing and gave her a Rx of pain medications.  She was taking this but did not have significant improvement.  She had a follow up visit with her doctor today and reports no concerning findings on tests.she did change prescriptions to hydrocodone and Robaxin for her pains and use this for most of the day without any improvements. Pains do not radiate. They are worse with moving or standing. She denies any associated weakness or numbness in lower cherries. Denies any urinary or fecal incontinence, urinary retention or perineal numbness. No recent fever, chills or sweats. No weight changes. No other aggravating or alleviating factors. No other associated symptoms.     Past Medical History  Diagnosis Date  . Breast cancer 08/04/1995    left  . Hypertension   . DJD (degenerative joint disease)   . Type 2 diabetes mellitus   . Dyspareunia   . Diabetes mellitus    Past Surgical History  Procedure Laterality Date  . Appendectomy    . Breast surgery  1998     Left Breast  . Breast surgery  1993    Right Breast  . Breast surgery  1997    Left Breast  . Partial hip arthroplasty  1998    Left side  . Foot arthrodesis, modified mcbride      1984  . Bone spur      1984  . Joint replacement      rt knee  . Total hip arthroplasty  08/20/2011    Procedure: TOTAL HIP ARTHROPLASTY;  Surgeon: [name], MD;  Location: [facility] OR;  Service: Orthopedics;  Laterality: Right;  DEPUY/PINNACLE CUP,SUMMIT BASIC STEM   Family History  Problem Relation Age of Onset  . Heart disease Brother    History  Substance  Use Topics  . Smoking status: Never Smoker   . Smokeless tobacco: Never Used  . Alcohol Use: No   OB History   Grav Para Term Preterm Abortions TAB SAB Ect Mult Living                 Review of Systems  Constitutional: Negative for fever, chills, diaphoresis and appetite change.  Respiratory: Negative for shortness of breath.   Cardiovascular: Negative for chest pain.  Gastrointestinal: Negative for nausea, vomiting, abdominal pain, diarrhea and constipation.  Genitourinary: Positive for frequency. Negative for dysuria, hematuria and flank pain.  Musculoskeletal: Positive for back pain.  All other systems reviewed and are negative.    Allergies  Celebrex; Sulfur; and Ibuprofen  Home Medications   Current Outpatient Rx  Name  Route  Sig  Dispense  Refill  . acetaminophen-codeine (TYLENOL #3) 300-30 MG per tablet   Oral   Take 1 tablet by mouth every 6 (six) hours as needed for pain.   15 tablet   0   . Calcium Carbonate-Vitamin D (CALCIUM 600 + D PO)   Oral   Take 1 tablet by mouth 2 (two) times daily.         Marland Kitchen  cholecalciferol (VITAMIN D) 1000 UNITS tablet   Oral   Take 1,000 Units by mouth daily.         . cyanocobalamin 500 MCG tablet   Oral   Take 500 mcg by mouth daily.         Marland Kitchen EXPIRED: ferrous gluconate (FERGON) 216 MG tablet   Oral   Take 1 tablet (216 mg total) by mouth 2 (two) times daily with a meal.   40 tablet   0   . glimepiride (AMARYL) 4 MG tablet   Oral   Take 2 mg by mouth daily before breakfast.         . metFORMIN (GLUCOPHAGE) 1000 MG tablet   Oral   Take 1,000 mg by mouth 2 (two) times daily with a meal.         . ramipril (ALTACE) 10 MG capsule   Oral   Take 10 mg by mouth daily.          BP 165/90  Pulse 88  Temp(Src) 97.9 F (36.6 C) (Oral)  Resp 20  SpO2 96% Physical Exam  Nursing note and vitals reviewed. Constitutional: She is oriented to person, place, and time. She appears well-developed and  well-nourished. No distress.  HENT:  Head: Normocephalic.  Neck: Normal range of motion. Neck supple.  Cardiovascular: Normal rate and regular rhythm.   Pulmonary/Chest: Effort normal and breath sounds normal. No respiratory distress. She has no wheezes. She has no rales.  Abdominal: Soft. There is no tenderness. There is no rebound and no guarding.  Musculoskeletal: Normal range of motion. She exhibits no edema and no tenderness.       Cervical back: Normal.       Thoracic back: Normal.       Lumbar back: She exhibits tenderness.       Back:  Neurological: She is alert and oriented to person, place, and time.  Skin: Skin is warm and dry. No rash noted.  Psychiatric: She has a normal mood and affect. Her behavior is normal.    ED Course  Procedures   Results for orders placed during the hospital encounter of 12/06/12  URINALYSIS, ROUTINE W REFLEX MICROSCOPIC      Result Value Range   Color, Urine YELLOW  YELLOW   APPearance CLEAR  CLEAR   Specific Gravity, Urine 1.013  1.005 - 1.030   pH 6.0  5.0 - 8.0   Glucose, UA NEGATIVE  NEGATIVE mg/dL   Hgb urine dipstick NEGATIVE  NEGATIVE   Bilirubin Urine NEGATIVE  NEGATIVE   Ketones, ur 15 (*) NEGATIVE mg/dL   Protein, ur NEGATIVE  NEGATIVE mg/dL   Urobilinogen, UA 0.2  0.0 - 1.0 mg/dL   Nitrite NEGATIVE  NEGATIVE   Leukocytes, UA SMALL (*) NEGATIVE  CBC WITH DIFFERENTIAL      Result Value Range   WBC 8.6  4.0 - 10.5 K/uL   RBC 4.03  3.87 - 5.11 MIL/uL   Hemoglobin 12.6  12.0 - 15.0 g/dL   HCT 16.1  09.6 - 04.5 %   MCV 90.6  78.0 - 100.0 fL   MCH 31.3  26.0 - 34.0 pg   MCHC 34.5  30.0 - 36.0 g/dL   RDW 40.9  81.1 - 91.4 %   Platelets 235  150 - 400 K/uL   Neutrophils Relative % 76  43 - 77 %   Neutro Abs 6.5  1.7 - 7.7 K/uL   Lymphocytes Relative 13  12 - 46 %  Lymphs Abs 1.1  0.7 - 4.0 K/uL   Monocytes Relative 10  3 - 12 %   Monocytes Absolute 0.9  0.1 - 1.0 K/uL   Eosinophils Relative 1  0 - 5 %   Eosinophils  Absolute 0.1  0.0 - 0.7 K/uL   Basophils Relative 0  0 - 1 %   Basophils Absolute 0.0  0.0 - 0.1 K/uL  URINE MICROSCOPIC-ADD ON      Result Value Range   Squamous Epithelial / LPF FEW (*) RARE   WBC, UA 7-10  <3 WBC/hpf   RBC / HPF 0-2  <3 RBC/hpf   Bacteria, UA RARE  RARE  POCT I-STAT, CHEM 8      Result Value Range   Sodium 134 (*) 135 - 145 mEq/L   Potassium 4.0  3.5 - 5.1 mEq/L   Chloride 95 (*) 96 - 112 mEq/L   BUN 15  6 - 23 mg/dL   Creatinine, Ser 4.09  0.50 - 1.10 mg/dL   Glucose, Bld 811 (*) 70 - 99 mg/dL   Calcium, Ion 9.14  7.82 - 1.30 mmol/L   TCO2 26  0 - 100 mmol/L   Hemoglobin 13.6  12.0 - 15.0 g/dL   HCT 95.6  21.3 - 08.6 %        Dg Lumbar Spine Complete  12/07/2012   *RADIOLOGY REPORT*  Clinical Data: No known injury.  Back pain and spasms.  LUMBAR SPINE - COMPLETE 4+ VIEW  Comparison: None.  Findings: T12 compression fracture, with 50% height loss anteriorly causing exaggerated kyphosis.  Although new from comparison imaging 08/20/2011 ( chest x-ray) the fracture is favored remote. No evidence of bony retropulsion.  There has likely also been previous superior end plate fracturing of L2, with minimal height loss.  Multilevel degenerative changes, with the worst disc narrowing at L5-L1.  Multilevel facet osteoarthritis, with the most bulky spurring at L5-L1.  Mild L2-3 and L3-4 retrolisthesis. Abdominal aortic atherosclerosis. Partly imaged bilateral hip arthroplasty.  IMPRESSION:  1.  Age indeterminate, but favored remote, T12 compression fracture with 50% height loss. 2.  Degenerative disc and facet disease.   Original Report Authenticated By: Tiburcio Pea      1. Low back pain   2. Compression fracture of T12 vertebra, initial encounter        MDM  Patient seen and evaluated. Patient appears in mild discomfort but no acute distress. She was seen twice already by PCP for similar symptoms. Reports that she had unremarkable blood and urine tests at her  visit earlier today. She has been trying to use hydrocodone and Robaxin for pain today with mild improvements. Denies any weakness or numbness of lower extremities. No urinary fecal incontinence, no perineal numbness. No other concerning red flag symptoms.   Patient reports improvement of back pain after medications. To this point labs and UA unremarkable. X-ray continues to be pending.  X-ray is significant for an old appearing T12 compression fracture. Patient has no significant point tenderness to this area. Her pain symptoms or lower in the L4-L5 and sacral area. She does have findings of degenerative disc disease in this area. She is able to stand and ambulate without significant difficulty. She has the most discomfort with initially twisting and standing up from the bed. I discussed the findings with patient and family. This time we will continue to treat her symptoms and change medications to oxycodone and Valium. She will followup with PCP this week for continued evaluation  and treatment.    Angus Seller, PA-C 12/07/12 [phone]

## 2012-12-06 NOTE — ED Notes (Signed)
PT ambulated in hall 

## 2012-12-07 MED ORDER — OXYCODONE-ACETAMINOPHEN 10-325 MG PO TABS
0.5000 | ORAL_TABLET | ORAL | Status: DC | PRN
Start: 2012-12-07 — End: 2013-01-21

## 2012-12-07 MED ORDER — DIAZEPAM 5 MG PO TABS: 5.0000 mg | ORAL_TABLET | Freq: Four times a day (QID) | ORAL | Status: DC | PRN

## 2012-12-07 NOTE — ED Provider Notes (Signed)
Medical screening examination/treatment/procedure(s) were performed by non-physician practitioner and as supervising physician I was immediately available for consultation/collaboration.  Juliet Rude. Rubin Payor, MD 12/07/12 1450

## 2012-12-20 ENCOUNTER — Encounter (HOSPITAL_COMMUNITY): Payer: Self-pay | Admitting: *Deleted

## 2012-12-20 ENCOUNTER — Emergency Department (HOSPITAL_COMMUNITY)
Admission: EM | Admit: 2012-12-20 | Discharge: 2012-12-20 | Disposition: A | Discharge status: Home / Self Care | Payer: Medicare Other | Attending: Emergency Medicine | Admitting: Emergency Medicine

## 2012-12-20 ENCOUNTER — Emergency Department (HOSPITAL_COMMUNITY): Payer: Medicare Other

## 2012-12-20 DIAGNOSIS — E119 Type 2 diabetes mellitus without complications: Secondary | ICD-10-CM | POA: Insufficient documentation

## 2012-12-20 DIAGNOSIS — M545 Low back pain, unspecified: Secondary | ICD-10-CM | POA: Insufficient documentation

## 2012-12-20 DIAGNOSIS — M5137 Other intervertebral disc degeneration, lumbosacral region: Secondary | ICD-10-CM | POA: Insufficient documentation

## 2012-12-20 DIAGNOSIS — I77811 Abdominal aortic ectasia: Secondary | ICD-10-CM | POA: Insufficient documentation

## 2012-12-20 DIAGNOSIS — R269 Unspecified abnormalities of gait and mobility: Secondary | ICD-10-CM | POA: Insufficient documentation

## 2012-12-20 DIAGNOSIS — I10 Essential (primary) hypertension: Secondary | ICD-10-CM | POA: Insufficient documentation

## 2012-12-20 DIAGNOSIS — Z79899 Other long term (current) drug therapy: Secondary | ICD-10-CM | POA: Insufficient documentation

## 2012-12-20 DIAGNOSIS — M51379 Other intervertebral disc degeneration, lumbosacral region without mention of lumbar back pain or lower extremity pain: Secondary | ICD-10-CM | POA: Insufficient documentation

## 2012-12-20 DIAGNOSIS — Z853 Personal history of malignant neoplasm of breast: Secondary | ICD-10-CM | POA: Insufficient documentation

## 2012-12-20 DIAGNOSIS — Z8742 Personal history of other diseases of the female genital tract: Secondary | ICD-10-CM | POA: Insufficient documentation

## 2012-12-20 DIAGNOSIS — M549 Dorsalgia, unspecified: Secondary | ICD-10-CM

## 2012-12-20 LAB — COMPREHENSIVE METABOLIC PANEL
ALT: 21 U/L (ref 0–35)
Alkaline Phosphatase: 117 U/L (ref 39–117)
CO2: 25 mEq/L (ref 19–32)
GFR calc Af Amer: 70 mL/min — ABNORMAL LOW (ref >90)
GFR calc non Af Amer: 61 mL/min — ABNORMAL LOW (ref >90)
Glucose, Bld: 209 mg/dL — ABNORMAL HIGH (ref 70–99)
Potassium: 4.2 mEq/L (ref 3.5–5.1)
Sodium: 130 mEq/L — ABNORMAL LOW (ref 135–145)

## 2012-12-20 LAB — CBC WITH DIFFERENTIAL/PLATELET
Eosinophils Relative: 2 % (ref 0–5)
Hemoglobin: 11.9 g/dL — ABNORMAL LOW (ref 12.0–15.0)
Lymphocytes Relative: 20 % (ref 12–46)
Lymphs Abs: 1.5 10*3/uL (ref 0.7–4.0)
MCV: 91.9 fL (ref 78.0–100.0)
Monocytes Relative: 7 % (ref 3–12)
Neutrophils Relative %: 71 % (ref 43–77)
Platelets: 236 10*3/uL (ref 150–400)
RBC: 3.95 MIL/uL (ref 3.87–5.11)
WBC: 7.7 10*3/uL (ref 4.0–10.5)

## 2012-12-20 LAB — URINALYSIS, ROUTINE W REFLEX MICROSCOPIC
Hgb urine dipstick: NEGATIVE
Specific Gravity, Urine: 1.01 (ref 1.005–1.030)
Urobilinogen, UA: 0.2 mg/dL (ref 0.0–1.0)
pH: 6.5 (ref 5.0–8.0)

## 2012-12-20 MED ORDER — MORPHINE SULFATE 2 MG/ML IJ SOLN
2.0000 mg | Freq: Once | INTRAMUSCULAR | Status: AC
  Administered 2012-12-20: 2 mg via INTRAVENOUS
  Filled 2012-12-20: qty 1

## 2012-12-20 MED ORDER — OXYCODONE-ACETAMINOPHEN 5-325 MG PO TABS: 2.0000 | ORAL_TABLET | ORAL | Status: DC | PRN

## 2012-12-20 NOTE — ED Notes (Signed)
Patient states most severe pain with standing and trying to get from lying to standing positions

## 2012-12-20 NOTE — Progress Notes (Signed)
Provided ministry of presence and hospitality to pt and family at bedside. Promoted information sharing between staff and family.  Shared words of encouragement and emotional support. Will foolow as needed.   12/20/12 1100  Clinical Encounter Type  Visited With Patient and family together;Health care provider  Visit Type ED  Referral From Family;Other (Comment)  Spiritual Encounters  Spiritual Needs Emotional;Other (Comment)  Venida Jarvis, [place], [phone]

## 2012-12-20 NOTE — ED Notes (Signed)
Patient with lower back pain x 2 weeks, seen here for same with no improvement since that time,

## 2012-12-20 NOTE — ED Notes (Signed)
Attempted to do in and out cath.  Patient did not have any urine return for a sample.  Patient also had a back spasm while moving her left leg.

## 2012-12-20 NOTE — Progress Notes (Signed)
   CARE MANAGEMENT ED NOTE 12/20/2012  Patient:  Diane Stevenson, Diane Stevenson   Account Number:  [id number]  Date Initiated:  12/20/2012  Documentation initiated by:  Fransico Michael  Subjective/Objective Assessment:   presented to ED with c/o back pain     Subjective/Objective Assessment Detail:     Action/Plan:   home health needs   Action/Plan Detail:   Anticipated DC Date:  12/20/2012     Status Recommendation to Physician:   Result of Recommendation:      DC Planning Services  CM consult   [facility] Choice  HOME HEALTH   Choice offered to / List presented to:  C-1 Patient     HH arranged  HH-1 RN  HH-2 PT  HH-3 OT  HH-4 NURSE'S AIDE      HH agency  [facility]    Status of service:    ED Comments:   ED Comments Detail:  12/20/1337-J.Leana Roe, RN,BSN [phone]      Marie,RN with [facility] notified of referral and need for home health RN,PT,OT, and Aide. No further care management needs identified at this time. Patient to be discharged home today.  12/20/12-1334-J.Sapphire Tygart,RN,BSN [phone]      [facility] spoke with Dr. Manus Gunning after meeting with patient and family. Requested orders for home health RN,PT,OT, and Aide. Voiced agreement.  12/20/12-1323-J.Iniko Robles,RN,BSN [phone]      In to speak with patient and family at bedside. Patient reports having progressively worsening back pain and weakness over the last couple of weeks. Denies being able to leave the house. Reports recovering from recent bilateral broken arms. No casts or splints noted at this time. Reports having cane and walker at home but that she "uses her husband to assist with walking". Patient and family reports that [facility] the past and would want them for services again.  12/20/12-1253-J.Aldene Hendon,RN,BSN [phone]      Received consult from Dr. Manus Gunning to see patient and evaluate for potential home health needs. Patient presented to ED with c/o low back pain and weakness.

## 2012-12-20 NOTE — ED Notes (Signed)
Pt ambulated in hallway. Pt denies dizziness/lightheadedness. Per family, pt did " a lot better ambulating here then she does at home." Pt denies pain when walking, but pain when going from laying to standing.

## 2012-12-20 NOTE — ED Provider Notes (Signed)
CSN: [id number]     Arrival date & time 12/20/12  0846 History     First MD Initiated Contact with Patient 12/20/12 [phone]     Chief Complaint  Patient presents with  . Back Pain   (Consider location/radiation/quality/duration/timing/severity/associated sxs/prior Treatment) HPI Comments: Patient present with 2 week history of low back pain without injury. She was seen in ED on July 15 for similar symptoms. She x-ray that showed degenerative changes. She's been taking Percocet and valium with partial relief that makes her sedated. Her family states she is not getting any better. She denies any bowel or bladder incontinence, focal weakness, numbness or tingling. No fevers or vomiting. Denies any chest pain or shortness of breath. The pain is in her low back and does not radiate. Denies any urinary symptoms. Denies any change in bowel or bladder habits. Denies abdominal pain or chest pain. No previous history of back problems. She's having difficulty ambulating at home and requires assistance from her family.  The history is provided by the patient and a relative.    Past Medical History  Diagnosis Date  . Breast cancer 08/04/1995    left  . Hypertension   . DJD (degenerative joint disease)   . Type 2 diabetes mellitus   . Dyspareunia   . Diabetes mellitus    Past Surgical History  Procedure Laterality Date  . Appendectomy    . Breast surgery  1998     Left Breast  . Breast surgery  1993    Right Breast  . Breast surgery  1997    Left Breast  . Partial hip arthroplasty  1998    Left side  . Foot arthrodesis, modified mcbride      1984  . Bone spur      1984  . Joint replacement      rt knee  . Total hip arthroplasty  08/20/2011    Procedure: TOTAL HIP ARTHROPLASTY;  Surgeon: [name], MD;  Location: [facility] OR;  Service: Orthopedics;  Laterality: Right;  DEPUY/PINNACLE CUP,SUMMIT BASIC STEM   Family History  Problem Relation Age of Onset  . Heart disease Brother    History   Substance Use Topics  . Smoking status: Never Smoker   . Smokeless tobacco: Never Used  . Alcohol Use: No   OB History   Grav Para Term Preterm Abortions TAB SAB Ect Mult Living                 Review of Systems  Constitutional: Negative for fever, activity change and appetite change.  HENT: Negative for congestion and rhinorrhea.   Respiratory: Negative for cough, chest tightness and shortness of breath.   Cardiovascular: Negative for chest pain.  Gastrointestinal: Negative for nausea, vomiting and abdominal pain.  Genitourinary: Negative for dysuria and hematuria.  Musculoskeletal: Positive for back pain and gait problem.  Skin: Negative for rash.  Neurological: Negative for dizziness, weakness and headaches.  A complete 10 system review of systems was obtained and all systems are negative except as noted in the HPI and PMH.    Allergies  Celebrex; Sulfur; and Ibuprofen  Home Medications   Current Outpatient Rx  Name  Route  Sig  Dispense  Refill  . Calcium Carbonate-Vitamin D (CALCIUM 600 + D PO)   Oral   Take 1 tablet by mouth 2 (two) times daily.         . cholecalciferol (VITAMIN D) 1000 UNITS tablet   Oral   Take  1,000 Units by mouth at bedtime.          . cyanocobalamin 500 MCG tablet   Oral   Take 500 mcg by mouth every morning.          . diazepam (VALIUM) 5 MG tablet   Oral   Take 1 tablet (5 mg total) by mouth every 6 (six) hours as needed for anxiety (spasms).   10 tablet   0   . glimepiride (AMARYL) 4 MG tablet   Oral   Take 2 mg by mouth daily before breakfast.         . IRON PO   Oral   Take 1 tablet by mouth daily.          . metFORMIN (GLUCOPHAGE) 1000 MG tablet   Oral   Take 1,000 mg by mouth 2 (two) times daily with a meal.         . oxyCODONE-acetaminophen (PERCOCET) 10-325 MG per tablet   Oral   Take 0.5-1 tablets by mouth every 4 (four) hours as needed for pain.   20 tablet   0   . ramipril (ALTACE) 10 MG capsule    Oral   Take 10 mg by mouth every morning.          Marland Kitchen oxyCODONE-acetaminophen (PERCOCET/ROXICET) 5-325 MG per tablet   Oral   Take 2 tablets by mouth every 4 (four) hours as needed for pain.   15 tablet   0    BP 146/73  Pulse 77  Temp(Src) 98 F (36.7 C)  Resp 14  SpO2 95% Physical Exam  Constitutional: She is oriented to person, place, and time. She appears well-developed and well-nourished. No distress.  HENT:  Head: Normocephalic and atraumatic.  Mouth/Throat: Oropharynx is clear and moist. No oropharyngeal exudate.  Eyes: Conjunctivae and EOM are normal. Pupils are equal, round, and reactive to light.  Neck: Normal range of motion. Neck supple.  Cardiovascular: Normal rate, regular rhythm and normal heart sounds.   No murmur heard. Pulmonary/Chest: Effort normal and breath sounds normal. No respiratory distress.  Abdominal: Soft. There is no tenderness. There is no rebound and no guarding.  Musculoskeletal: Normal range of motion. She exhibits tenderness. She exhibits no edema.  Diffuse paraspinal lumbar pain.  No midline pain.  5/5 strength in bilateral lower extremities. Ankle plantar and dorsiflexion intact. Great toe extension intact bilaterally. +2 DP and PT pulses. +2 patellar reflexes bilaterally.    Neurological: She is alert and oriented to person, place, and time. No cranial nerve deficit. She exhibits normal muscle tone. Coordination normal.  Skin: Skin is warm.    ED Course   Procedures (including critical care time)  Labs Reviewed  URINALYSIS, ROUTINE W REFLEX MICROSCOPIC - Abnormal; Notable for the following:    Leukocytes, UA SMALL (*)    All other components within normal limits  CBC WITH DIFFERENTIAL - Abnormal; Notable for the following:    Hemoglobin 11.9 (*)    All other components within normal limits  COMPREHENSIVE METABOLIC PANEL - Abnormal; Notable for the following:    Sodium 130 (*)    Chloride 93 (*)    Glucose, Bld 209 (*)     Albumin 3.4 (*)    GFR calc non Af Amer 61 (*)    GFR calc Af Amer 70 (*)    All other components within normal limits  URINE MICROSCOPIC-ADD ON - Abnormal; Notable for the following:    Squamous Epithelial / LPF FEW (*)  All other components within normal limits   Ct Lumbar Spine Wo Contrast  12/20/2012   *RADIOLOGY REPORT*  Clinical Data: Low back pain at waist level for 2 weeks, no known injury, known compression fracture  CT LUMBAR SPINE WITHOUT CONTRAST  Technique:  Multidetector CT imaging of the lumbar spine was performed without intravenous contrast administration. Multiplanar CT image reconstructions were also generated.  Comparison: Radiograph 12/06/2012  Findings: There is heavy atherosclerotic calcification of the aorta.  The aorta is not dilated.  There is an approximately 50% T12 compression deformity with very slight retropulsion causing minimal canal narrowing. Fracture lines involving the vertebral body are visible suggesting that the fracture is subacute.  There is normal anteroposterior alignment of the lumbar spine.  T12 - L1 shows a mild disc bulge causing mild canal narrowing.  At L1-L2, there is a mild disc bulge and there is mild facet and ligamentum flavum hypertrophy.  There is mild canal and bilateral foraminal narrowing.  At L2-3, there is moderate canal narrowing primarily due to moderate disc bulge.  There is also ligamentum flavum hypertrophy. And there is mild bilateral foraminal narrowing.  At L3-4, there is a moderate to large disc bulge.  There is moderate facet hypertrophy.  There is severe canal narrowing. There is a moderate bilateral foraminal narrowing.  At L4-5, there is a moderate-to-large disc bulge.  There is ligamentum flavum hypertrophy and there is moderate facet hypertrophy.  There is severe canal narrowing.  There is moderately severe bilateral foraminal narrowing.  At L5 S1, there is a moderate diffuse disc bulge with significant inferiorly directed disc  extrusion containing vacuum phenomenon. The extrusion extends inferiorly to the mid S1 level.  There is moderate facet hypertrophy at this level.  There is moderate canal narrowing.  There is moderate bilateral foraminal narrowing.  IMPRESSION: Significant canal and foraminal narrowing throughout the lumbar spine due to disc and facet pathology  Moderate T12 compression deformity appears subacute with minimal retropulsion of the posterior-superior cortex.   Original Report Authenticated By: Esperanza Heir, M.D.   US Aorta  12/20/2012   *RADIOLOGY REPORT*  Clinical Data:  Back pain  ULTRASOUND OF ABDOMINAL AORTA  Technique:  Ultrasound examination of the abdominal aorta was performed to evaluate for abdominal aortic aneurysm.  Comparison: None.  Abdominal Aorta:  No aneurysm identified.        Maximum AP diameter:  2.8 cm.       Maximum TRV diameter:  2.2 cm.  IMPRESSION: Mild ectasia of the mid abdominal aorta without aneurysmal dilatation by sonographic criteria.   Original Report Authenticated By: Christiana Pellant, M.D.   1. Back pain     MDM  Atraumatic low back pain without neurological deficit. No focal weakness numbness or tingling. No abdominal pain.  CT scan shows multilevel foraminal narrowing and stenosis. Old T12 compression fracture. This not at location of patient's pain. She has good strength in her lower extremities and no focal weakness, numbness or tingling. She's able to ambulate. Suspect her pain is secondary to degenerative disc disease. No evidence of cord compression or cauda equina.  Patient able to ambulate without assistance.  UA negative.  Aorta normal on [place]. PAtient states she feels well and denies back pain currently. Family concerned about having enough help at home. We'll involve case Production designer, theatre/television/film.  Arrangements made for home health RN, aid and PT.  Glynn Octave, MD 12/20/12 [phone]

## 2013-01-12 ENCOUNTER — Other Ambulatory Visit: Payer: Self-pay | Admitting: Neurosurgery

## 2013-01-19 ENCOUNTER — Ambulatory Visit
Admission: RE | Admit: 2013-01-19 | Discharge: 2013-01-19 | Disposition: A | Discharge status: Home / Self Care | Payer: Medicare Other | Source: Ambulatory Visit | Attending: Neurosurgery | Admitting: Neurosurgery

## 2013-01-19 MED ORDER — GADOBENATE DIMEGLUMINE 529 MG/ML IV SOLN
14.0000 mL | Freq: Once | INTRAVENOUS | Status: AC | PRN
  Administered 2013-01-19: 14 mL via INTRAVENOUS

## 2013-01-20 ENCOUNTER — Other Ambulatory Visit: Payer: Self-pay | Admitting: Neurosurgery

## 2013-01-21 ENCOUNTER — Encounter (HOSPITAL_COMMUNITY): Payer: Self-pay | Admitting: Pharmacy Technician

## 2013-01-26 ENCOUNTER — Encounter (HOSPITAL_COMMUNITY)
Admission: RE | Admit: 2013-01-26 | Discharge: 2013-01-26 | Disposition: A | Discharge status: Home / Self Care | Payer: Medicare Other | Source: Ambulatory Visit | Attending: Neurosurgery | Admitting: Neurosurgery

## 2013-01-26 ENCOUNTER — Encounter (HOSPITAL_COMMUNITY): Payer: Self-pay

## 2013-01-26 HISTORY — DX: Gastro-esophageal reflux disease without esophagitis: K21.9

## 2013-01-26 LAB — BASIC METABOLIC PANEL
CO2: 24 mEq/L (ref 19–32)
Calcium: 10.1 mg/dL (ref 8.4–10.5)
Creatinine, Ser: 0.75 mg/dL (ref 0.50–1.10)
GFR calc Af Amer: 90 mL/min — ABNORMAL LOW (ref >90)
GFR calc non Af Amer: 77 mL/min — ABNORMAL LOW (ref >90)
Sodium: 131 mEq/L — ABNORMAL LOW (ref 135–145)

## 2013-01-26 LAB — SURGICAL PCR SCREEN
MRSA, PCR: NEGATIVE
Staphylococcus aureus: NEGATIVE

## 2013-01-26 LAB — CBC
Platelets: 285 10*3/uL (ref 150–400)
RBC: 4.06 MIL/uL (ref 3.87–5.11)
RDW: 15.3 % (ref 11.5–15.5)
WBC: 9.1 10*3/uL (ref 4.0–10.5)

## 2013-01-26 MED ORDER — CEFAZOLIN SODIUM-DEXTROSE 2-3 GM-% IV SOLR
2.0000 g | INTRAVENOUS | Status: AC
  Administered 2013-01-27: 2 g via INTRAVENOUS

## 2013-01-26 NOTE — Progress Notes (Signed)
Pt. Denies being followed by cardiologist, last ekg 1 + yr. Ago. Will complete new ekg today. Pt. Denies chest pain, difficulty breathing, SOB.

## 2013-01-26 NOTE — Pre-Procedure Instructions (Signed)
Diane Stevenson [facility]  01/26/2013   Your procedure is scheduled on:  01/27/2013  Report to [facility] at 12:15 PM.  Call this number if you have problems the morning of surgery: [phone]   Remember:   Do not eat food or drink liquids after midnight.  On Wednesday   Take these medicines the morning of surgery with A SIP OF WATER: Valium, pain medicine, TUMS if you need them    Do not wear jewelry, make-up or nail polish.  Do not wear lotions, powders, or perfumes. You may wear deodorant.  Do not shave 48 hours prior to surgery.   Do not bring valuables to the hospital.  [facility] is not responsible                   for any belongings or valuables.  Contacts, dentures or bridgework may not be worn into surgery.  Leave suitcase in the car. After surgery it may be brought to your room.  For patients admitted to the hospital, checkout time is 11:00 AM the day of  discharge.   Patients discharged the day of surgery will not be allowed to drive  home.  Name and phone number of your driver: with Spouse  Special Instructions: Shower using CHG 2 nights before surgery and the night before surgery.  If you shower the day of surgery use CHG.  Use special wash - you have one bottle of CHG for all showers.  You should use approximately 1/3 of the bottle for each shower.   Please read over the following fact sheets that you were given: Pain Booklet, Coughing and Deep Breathing, MRSA Information and Surgical Site Infection Prevention

## 2013-01-26 NOTE — Progress Notes (Signed)
EKG's past &  present reviewed /w Dr. Noreene Larsson, no new orders.

## 2013-01-27 ENCOUNTER — Ambulatory Visit (HOSPITAL_COMMUNITY)
Admission: RE | Admit: 2013-01-27 | Discharge: 2013-01-28 | Disposition: A | Discharge status: Home / Self Care | Payer: Medicare Other | Source: Ambulatory Visit | Attending: Neurosurgery | Admitting: Neurosurgery

## 2013-01-27 ENCOUNTER — Encounter (HOSPITAL_COMMUNITY): Payer: Self-pay | Admitting: Anesthesiology

## 2013-01-27 ENCOUNTER — Ambulatory Visit (HOSPITAL_COMMUNITY): Payer: Medicare Other | Admitting: Anesthesiology

## 2013-01-27 ENCOUNTER — Ambulatory Visit (HOSPITAL_COMMUNITY): Payer: Medicare Other

## 2013-01-27 ENCOUNTER — Encounter (HOSPITAL_COMMUNITY): Payer: Self-pay | Admitting: *Deleted

## 2013-01-27 ENCOUNTER — Encounter (HOSPITAL_COMMUNITY)
Admission: RE | Disposition: A | Discharge status: Home / Self Care | Payer: Self-pay | Source: Ambulatory Visit | Attending: Neurosurgery

## 2013-01-27 DIAGNOSIS — Z853 Personal history of malignant neoplasm of breast: Secondary | ICD-10-CM | POA: Insufficient documentation

## 2013-01-27 DIAGNOSIS — Z96659 Presence of unspecified artificial knee joint: Secondary | ICD-10-CM | POA: Insufficient documentation

## 2013-01-27 DIAGNOSIS — I10 Essential (primary) hypertension: Secondary | ICD-10-CM | POA: Insufficient documentation

## 2013-01-27 DIAGNOSIS — Z96649 Presence of unspecified artificial hip joint: Secondary | ICD-10-CM | POA: Insufficient documentation

## 2013-01-27 DIAGNOSIS — E119 Type 2 diabetes mellitus without complications: Secondary | ICD-10-CM | POA: Insufficient documentation

## 2013-01-27 DIAGNOSIS — M8448XA Pathological fracture, other site, initial encounter for fracture: Secondary | ICD-10-CM | POA: Insufficient documentation

## 2013-01-27 HISTORY — PX: KYPHOPLASTY: SHX5884

## 2013-01-27 LAB — GLUCOSE, CAPILLARY
Glucose-Capillary: 114 mg/dL — ABNORMAL HIGH (ref 70–99)
Glucose-Capillary: 273 mg/dL — ABNORMAL HIGH (ref 70–99)

## 2013-01-27 SURGERY — KYPHOPLASTY
Anesthesia: General | Site: Back | Wound class: Clean

## 2013-01-27 MED ORDER — PROPOFOL 10 MG/ML IV BOLUS
INTRAVENOUS | Status: DC | PRN
  Administered 2013-01-27: 20 mg via INTRAVENOUS
  Administered 2013-01-27: 100 mg via INTRAVENOUS

## 2013-01-27 MED ORDER — DEXAMETHASONE SODIUM PHOSPHATE 4 MG/ML IJ SOLN
INTRAMUSCULAR | Status: AC
  Administered 2013-01-27: 4 mg via INTRAVENOUS
  Filled 2013-01-27: qty 1

## 2013-01-27 MED ORDER — VITAMIN B-12 1000 MCG PO TABS
1000.0000 ug | ORAL_TABLET | Freq: Every morning | ORAL | Status: DC
  Administered 2013-01-28: 1000 ug via ORAL
  Filled 2013-01-27: qty 1

## 2013-01-27 MED ORDER — METOCLOPRAMIDE HCL 5 MG/ML IJ SOLN
INTRAMUSCULAR | Status: AC
  Administered 2013-01-27: 10 mg via INTRAVENOUS
  Filled 2013-01-27: qty 2

## 2013-01-27 MED ORDER — ROCURONIUM BROMIDE 100 MG/10ML IV SOLN
INTRAVENOUS | Status: DC | PRN
  Administered 2013-01-27: 10 mg via INTRAVENOUS

## 2013-01-27 MED ORDER — OXYCODONE-ACETAMINOPHEN 5-325 MG PO TABS: 2.0000 | ORAL_TABLET | ORAL | Status: DC | PRN

## 2013-01-27 MED ORDER — VITAMIN D3 25 MCG (1000 UNIT) PO TABS
1000.0000 [IU] | ORAL_TABLET | Freq: Every day | ORAL | Status: DC
  Administered 2013-01-27: 1000 [IU] via ORAL
  Filled 2013-01-27 (×2): qty 1

## 2013-01-27 MED ORDER — ONDANSETRON HCL 4 MG/2ML IJ SOLN
INTRAMUSCULAR | Status: DC | PRN
  Administered 2013-01-27: 4 mg via INTRAVENOUS

## 2013-01-27 MED ORDER — IOHEXOL 300 MG/ML  SOLN
INTRAMUSCULAR | Status: DC | PRN
  Administered 2013-01-27 (×2): 50 mL

## 2013-01-27 MED ORDER — OXYCODONE HCL 5 MG/5ML PO SOLN: 5.0000 mg | Freq: Once | ORAL | Status: DC | PRN

## 2013-01-27 MED ORDER — ASPIRIN 81 MG PO CHEW
81.0000 mg | CHEWABLE_TABLET | Freq: Every day | ORAL | Status: DC
  Administered 2013-01-27 – 2013-01-28 (×2): 81 mg via ORAL
  Filled 2013-01-27 (×2): qty 1

## 2013-01-27 MED ORDER — POTASSIUM CHLORIDE IN NACL 20-0.9 MEQ/L-% IV SOLN
INTRAVENOUS | Status: DC
  Filled 2013-01-27 (×3): qty 1000

## 2013-01-27 MED ORDER — RAMIPRIL 10 MG PO CAPS
10.0000 mg | ORAL_CAPSULE | Freq: Every morning | ORAL | Status: DC
  Administered 2013-01-27 – 2013-01-28 (×2): 10 mg via ORAL
  Filled 2013-01-27 (×2): qty 1

## 2013-01-27 MED ORDER — MEPERIDINE HCL 25 MG/ML IJ SOLN: 6.2500 mg | INTRAMUSCULAR | Status: DC | PRN

## 2013-01-27 MED ORDER — DEXTROSE 50 % IV SOLN
INTRAVENOUS | Status: AC
  Administered 2013-01-27: 6.25 g via INTRAVENOUS
  Filled 2013-01-27: qty 50

## 2013-01-27 MED ORDER — SODIUM CHLORIDE 0.9 % IV SOLN
10.0000 mg | INTRAVENOUS | Status: DC | PRN
  Administered 2013-01-27: 10 ug/min via INTRAVENOUS

## 2013-01-27 MED ORDER — FENTANYL CITRATE 0.05 MG/ML IJ SOLN
INTRAMUSCULAR | Status: DC | PRN
  Administered 2013-01-27: 150 ug via INTRAVENOUS

## 2013-01-27 MED ORDER — FENTANYL CITRATE 0.05 MG/ML IJ SOLN
50.0000 ug | INTRAMUSCULAR | Status: DC | PRN
  Administered 2013-01-27: 25 ug via INTRAVENOUS
  Administered 2013-01-27: 50 ug via INTRAVENOUS

## 2013-01-27 MED ORDER — LIDOCAINE-EPINEPHRINE 0.5 %-1:200000 IJ SOLN
INTRAMUSCULAR | Status: DC | PRN
  Administered 2013-01-27: 10 mL

## 2013-01-27 MED ORDER — CALCIUM CARBONATE ANTACID 500 MG PO CHEW
1.5000 | CHEWABLE_TABLET | Freq: Two times a day (BID) | ORAL | Status: DC | PRN
  Filled 2013-01-27: qty 1.5

## 2013-01-27 MED ORDER — PHENYLEPHRINE HCL 10 MG/ML IJ SOLN
INTRAMUSCULAR | Status: DC | PRN
  Administered 2013-01-27 (×3): 80 ug via INTRAVENOUS

## 2013-01-27 MED ORDER — FENTANYL CITRATE 0.05 MG/ML IJ SOLN
INTRAMUSCULAR | Status: AC
  Administered 2013-01-27: 25 ug via INTRAVENOUS
  Filled 2013-01-27: qty 2

## 2013-01-27 MED ORDER — LACTATED RINGERS IV SOLN
INTRAVENOUS | Status: DC
  Administered 2013-01-27: 12:00:00 via INTRAVENOUS

## 2013-01-27 MED ORDER — 0.9 % SODIUM CHLORIDE (POUR BTL) OPTIME
TOPICAL | Status: DC | PRN
  Administered 2013-01-27: 1000 mL

## 2013-01-27 MED ORDER — SUCCINYLCHOLINE CHLORIDE 20 MG/ML IJ SOLN
INTRAMUSCULAR | Status: DC | PRN
  Administered 2013-01-27: 100 mg via INTRAVENOUS

## 2013-01-27 MED ORDER — LIDOCAINE HCL (CARDIAC) 20 MG/ML IV SOLN
INTRAVENOUS | Status: DC | PRN
  Administered 2013-01-27: 65 mg via INTRAVENOUS

## 2013-01-27 MED ORDER — BISACODYL 5 MG PO TBEC
5.0000 mg | DELAYED_RELEASE_TABLET | Freq: Every day | ORAL | Status: DC | PRN
  Filled 2013-01-27: qty 1

## 2013-01-27 MED ORDER — LACTATED RINGERS IV SOLN
INTRAVENOUS | Status: DC | PRN
  Administered 2013-01-27: 12:00:00 via INTRAVENOUS

## 2013-01-27 MED ORDER — GLIMEPIRIDE 2 MG PO TABS
2.0000 mg | ORAL_TABLET | Freq: Every day | ORAL | Status: DC
Start: 2013-01-28 — End: 2013-01-28
  Administered 2013-01-28: 2 mg via ORAL
  Filled 2013-01-27 (×2): qty 1

## 2013-01-27 MED ORDER — ONDANSETRON HCL 4 MG/2ML IJ SOLN
4.0000 mg | Freq: Once | INTRAMUSCULAR | Status: AC | PRN
  Administered 2013-01-27: 4 mg via INTRAVENOUS

## 2013-01-27 MED ORDER — DEXAMETHASONE SODIUM PHOSPHATE 4 MG/ML IJ SOLN
4.0000 mg | Freq: Once | INTRAMUSCULAR | Status: AC
  Administered 2013-01-27: 4 mg via INTRAVENOUS

## 2013-01-27 MED ORDER — NEOSTIGMINE METHYLSULFATE 1 MG/ML IJ SOLN
INTRAMUSCULAR | Status: DC | PRN
  Administered 2013-01-27: 1 mg via INTRAVENOUS

## 2013-01-27 MED ORDER — OXYCODONE HCL 5 MG PO TABS: 5.0000 mg | ORAL_TABLET | Freq: Once | ORAL | Status: DC | PRN

## 2013-01-27 MED ORDER — LABETALOL HCL 5 MG/ML IV SOLN
5.0000 mg | INTRAVENOUS | Status: DC | PRN
  Administered 2013-01-27 (×2): 5 mg via INTRAVENOUS
  Administered 2013-01-27: 10 mg via INTRAVENOUS

## 2013-01-27 MED ORDER — ARTIFICIAL TEARS OP OINT
TOPICAL_OINTMENT | OPHTHALMIC | Status: DC | PRN
  Administered 2013-01-27: 1 via OPHTHALMIC

## 2013-01-27 MED ORDER — HYDROMORPHONE HCL PF 1 MG/ML IJ SOLN: 0.2500 mg | INTRAMUSCULAR | Status: DC | PRN

## 2013-01-27 MED ORDER — MORPHINE SULFATE 2 MG/ML IJ SOLN: 2.0000 mg | INTRAMUSCULAR | Status: DC | PRN

## 2013-01-27 MED ORDER — METFORMIN HCL 500 MG PO TABS
1000.0000 mg | ORAL_TABLET | Freq: Two times a day (BID) | ORAL | Status: DC
  Administered 2013-01-28: 1000 mg via ORAL
  Filled 2013-01-27 (×3): qty 2

## 2013-01-27 MED ORDER — METOCLOPRAMIDE HCL 5 MG/ML IJ SOLN
10.0000 mg | Freq: Once | INTRAMUSCULAR | Status: AC
  Administered 2013-01-27: 10 mg via INTRAVENOUS

## 2013-01-27 MED ORDER — DIAZEPAM 5 MG PO TABS: 5.0000 mg | ORAL_TABLET | Freq: Four times a day (QID) | ORAL | Status: DC | PRN

## 2013-01-27 MED ORDER — ONDANSETRON HCL 4 MG/2ML IJ SOLN
INTRAMUSCULAR | Status: AC
  Administered 2013-01-27: 4 mg via INTRAVENOUS
  Filled 2013-01-27: qty 2

## 2013-01-27 SURGICAL SUPPLY — 45 items
ADH SKN CLS APL DERMABOND .7 (GAUZE/BANDAGES/DRESSINGS) ×1
ADH SKN CLS LQ APL DERMABOND (GAUZE/BANDAGES/DRESSINGS) ×1
BLADE SURG 15 STRL LF DISP TIS (BLADE) ×1 IMPLANT
BLADE SURG 15 STRL SS (BLADE) ×2
BLADE SURG ROTATE 9660 (MISCELLANEOUS) IMPLANT
CEMENT BONE KYPHX HV R (Orthopedic Implant) ×1 IMPLANT
CEMENT KYPHON C01A KIT/MIXER (Cement) ×1 IMPLANT
CLOTH BEACON ORANGE TIMEOUT ST (SAFETY) ×2 IMPLANT
CONT SPEC 4OZ CLIKSEAL STRL BL (MISCELLANEOUS) ×4 IMPLANT
DERMABOND ADHESIVE PROPEN (GAUZE/BANDAGES/DRESSINGS) ×1
DERMABOND ADVANCED (GAUZE/BANDAGES/DRESSINGS) ×1
DERMABOND ADVANCED .7 DNX12 (GAUZE/BANDAGES/DRESSINGS) ×1 IMPLANT
DERMABOND ADVANCED .7 DNX6 (GAUZE/BANDAGES/DRESSINGS) IMPLANT
DRAPE C-ARM 42X72 X-RAY (DRAPES) ×2 IMPLANT
DRAPE LAPAROTOMY 100X72X124 (DRAPES) ×2 IMPLANT
DRAPE PROXIMA HALF (DRAPES) ×2 IMPLANT
DRAPE SURG 17X23 STRL (DRAPES) ×2 IMPLANT
DRESSING TELFA 8X3 (GAUZE/BANDAGES/DRESSINGS) IMPLANT
DURAPREP 26ML APPLICATOR (WOUND CARE) ×2 IMPLANT
GAUZE SPONGE 4X4 16PLY XRAY LF (GAUZE/BANDAGES/DRESSINGS) ×2 IMPLANT
GLOVE BIOGEL PI IND STRL 7.0 (GLOVE) IMPLANT
GLOVE BIOGEL PI INDICATOR 7.0 (GLOVE) ×1
GLOVE ECLIPSE 6.5 STRL STRAW (GLOVE) ×3 IMPLANT
GLOVE EXAM NITRILE LRG STRL (GLOVE) IMPLANT
GLOVE EXAM NITRILE MD LF STRL (GLOVE) IMPLANT
GLOVE EXAM NITRILE XL STR (GLOVE) IMPLANT
GLOVE EXAM NITRILE XS STR PU (GLOVE) IMPLANT
GLOVE SURG SS PI 7.0 STRL IVOR (GLOVE) ×1 IMPLANT
GOWN BRE IMP SLV AUR LG STRL (GOWN DISPOSABLE) ×4 IMPLANT
GOWN BRE IMP SLV AUR XL STRL (GOWN DISPOSABLE) IMPLANT
GOWN STRL REIN 2XL LVL4 (GOWN DISPOSABLE) IMPLANT
KIT BASIN OR (CUSTOM PROCEDURE TRAY) ×2 IMPLANT
KIT ROOM TURNOVER OR (KITS) ×2 IMPLANT
NDL HYPO 25X1 1.5 SAFETY (NEEDLE) ×1 IMPLANT
NEEDLE HYPO 25X1 1.5 SAFETY (NEEDLE) ×2 IMPLANT
NS IRRIG 1000ML POUR BTL (IV SOLUTION) ×2 IMPLANT
PACK SURGICAL SETUP 50X90 (CUSTOM PROCEDURE TRAY) ×2 IMPLANT
PAD ARMBOARD 7.5X6 YLW CONV (MISCELLANEOUS) ×6 IMPLANT
SPECIMEN JAR SMALL (MISCELLANEOUS) IMPLANT
STAPLER SKIN PROX WIDE 3.9 (STAPLE) ×2 IMPLANT
SUT VIC AB 3-0 SH 8-18 (SUTURE) ×2 IMPLANT
SYR CONTROL 10ML LL (SYRINGE) ×4 IMPLANT
TOWEL OR 17X24 6PK STRL BLUE (TOWEL DISPOSABLE) ×2 IMPLANT
TOWEL OR 17X26 10 PK STRL BLUE (TOWEL DISPOSABLE) ×2 IMPLANT
TRAY KYPHOPAK 15/3 ONESTEP 1ST (MISCELLANEOUS) ×2 IMPLANT

## 2013-01-27 NOTE — Transfer of Care (Signed)
Immediate Anesthesia Transfer of Care Note  Patient: [name] [facility]  Procedure(s) Performed: Procedure(s) with comments: THORACIC ELEVEN, THORACIC TWELVE KYPHOPLASTY (N/A) - T11 and T12 kyphoplasty  Patient Location: PACU  Anesthesia Type:General  Level of Consciousness: awake, alert , oriented and patient cooperative  Airway & Oxygen Therapy: Patient Spontanous Breathing  Post-op Assessment: Report given to PACU RN, Post -op Vital signs reviewed and stable and Patient moving all extremities X 4  Post vital signs: Reviewed and stable  Complications: No apparent anesthesia complications

## 2013-01-27 NOTE — Anesthesia Postprocedure Evaluation (Signed)
  Anesthesia Post-op Note  Patient: Diane Stevenson [facility]  Procedure(s) Performed: Procedure(s) with comments: THORACIC ELEVEN, THORACIC TWELVE KYPHOPLASTY (N/A) - T11 and T12 kyphoplasty  Patient Location: PACU  Anesthesia Type:General  Level of Consciousness: awake, alert , oriented and patient cooperative  Airway and Oxygen Therapy: Patient Spontanous Breathing and Patient connected to nasal cannula oxygen  Post-op Pain: none  Post-op Assessment: Post-op Vital signs reviewed, Patient's Cardiovascular Status Stable, Respiratory Function Stable, Patent Airway, No signs of Nausea or vomiting and Pain level controlled  Post-op Vital Signs: Reviewed and stable  Complications: No apparent anesthesia complications

## 2013-01-27 NOTE — Op Note (Signed)
01/27/2013  4:53 PM  PATIENT:  Diane Stevenson  79 y.o. female  PRE-OPERATIVE DIAGNOSIS:  compression fracture thoracic T11,12  POST-OPERATIVE DIAGNOSIS:  compression fracture thoracic T11,12  PROCEDURE:  Procedure(s): THORACIC ELEVEN, THORACIC TWELVE KYPHOPLASTY  SURGEON:  Surgeon(s): Carmela Hurt, MD  ASSISTANTS:none  ANESTHESIA:   general  EBL:     BLOOD ADMINISTERED:none  CELL SAVER GIVEN:none  COUNT:per nursing  DRAINS: none   SPECIMEN:  No Specimen  DICTATION: Diane Stevenson was taken to the operating room, intubated, and placed under a general anesthetic without difficulty. She was positioned prone on a Jackson table with all pressure points padded properly. Her back was prepped and draped in a sterile manner. With biplanar fluoroscopy I made my incisions after infiltrating both sites with lidocaine. I introduced trocars into the right side pedicles of T11, and T12. I drilled into each vertebral body, then placed the balloon. At each level after the cavity was created I filled the space with methylmethacrylate using live fluoro to assess my progress. I was able to cross the midline at each level from a single pedicle entry. When I was satisfied with the imaging I closed the wounds with a single stitch each. I used dermabond for a sterile dressing.   PLAN OF CARE: Admit for overnight observation  PATIENT DISPOSITION:  PACU - hemodynamically stable.   Delay start of Pharmacological VTE agent (>24hrs) due to surgical blood loss or risk of bleeding:  yes

## 2013-01-27 NOTE — Preoperative (Signed)
Beta Blockers   Reason not to administer Beta Blockers:Not Applicable 

## 2013-01-27 NOTE — H&P (Signed)
BP 191/86  Pulse 74  Resp 18  SpO2 99%     Diane Stevenson is an 79 year old woman who presents today for a four-week history of severe pain in the back secondary to lumbar spasms.    HISTORY OF PRESENT ILLNESS:                     She had no problems prior to this.  She simply awoke and started having pain.  She took a fall three months prior to that, in 07/2012, but had no problems immediately after outside of two wrist fractures, which were caused by the fall.  She has no bowel or bladder dysfunction, no lower extremity weakness, but she has had severe pain in the lower back that is unrelenting.  She has used the walker now for the last four weeks also.  She is taking muscle relaxants and pain medications and is currently taking meloxicam at this time.  She says there is no postural relief of the pain.  She is retired and right-handed.   REVIEW OF SYSTEMS:                                    Positive for back pain and arthritis.  She denies Constitutional, Eye, Ears, Nose, Throat, Mouth, Cardiovascular, Respiratory, Gastrointestinal, Genitourinary, Skin, Neurological, Psychiatric, Endocrine, Hematologic, and Allergic problems.   PAST MEDICAL HISTORY:                                           Current Medical Conditions:  Significant for hypertension, diabetes, and breast cancer.            Prior Operations:  She has undergone two hip replacements, a right knee replacement, and a breast biopsy.            Medications and Allergies:  She currently takes Altace, aspirin, glimepiride, hydrocodone, meloxicam, metformin, Robaxin, vitamin B12. She has also has had Pravastatin and Simvastatin, which has caused cramps in the extremities. SHE IS ALLERGIC TO SULFA MEDICATIONS, AND IBUPROFEN CAUSES SWELLING THE FEET AND ANKLES.   FAMILY HISTORY:                                            Mother and father are deceased.  She does have heart disease in the family. SOCIAL HISTORY:                                             She does not smoke, she does not use alcohol, she does not use illicit drugs.   PHYSICAL EXAMINATION:                                She is a 64.96 inches in height, weighs 154 pounds.  She has a BMI of 25.66.  Blood pressure is 176/77, pulse is 80.  She is alert and oriented x4 and answering all questions appropriately. Memory, language, attention span, and fund of knowledge are normal.  She is well kempt and  in no distress. She has 5/5 strength in both upper and lower extremities.  She has normal muscle tone, bulk, and coordination in the upper extremities.  She does use a walker when she walks.  Her gait appeared to be steady; she did not list to either the right or the left.  She has 2+ reflexes in biceps, triceps, brachioradialis.  No reflex elicited at the right knee, none at the left knee, none elicited at either ankle.  She has intact proprioception in both the upper and lower extremities.  She has no clonus, she has no Hoffman sign.  Toes are downgoing to plantar stimulation.    Pupils are equal, round, reactive to light. Full extraocular movements, full visual fields.  Symmetric facial sensation and movement.  Hearing intact to voice, though diminished slightly.  Uvula elevates in the midline.  Shoulder shrug is normal.  Tongue protrudes in the midline.  Oral mucosa is normal.   DATA:                                                              CT and plain films of the lumbar spine are reviewed.  They show compression fracture at T12.  She had a film of the abdomen and pelvis performed in 2008, and it does not show a T12 compression fracture.  She has bony changes consistent with spondylosis in the lumbar spine and narrowed disk spaces at multiple levels.                                                   I believe that the compression fracture at T12 is the most likely cause of the discomfort that Diane Stevenson is having right now.  Diane Stevenson returned today with an MRI of the  lumbar spine.  Because of Diane Stevenson's presentation, that being pain in her back which started two months ago and given that obviously the stenosis of long standing duration, I will treat her initially with kyphoplasty at T11 and T12. I think this is the biggest reason that she has the pain and clearly is the simplest procedure. If we are not getting any relief after a month or two then yes I can consider doing an open procedure in the lumbar spine and decompress her, but her biggest problem was back pain.   DATA:                                                  There are a number of findings on this study. She has a compression fracture of the inferior endplate of T11. She has a fracture we knew about at T12. She is profoundly stenotic at 4-5 and 3-4. She has a herniated disc at 5-1.       REVIEW OF SYSTEMS:                        Positive for back pain, some vision changes, extremity weakness, and gait disturbance.  PHYSICAL EXAMINATION:                    She is 65 inches in height. Weighs 153 pounds. Blood pressure is 133/79. Pulse is 86. She is taking Altace, aspirin, glimepiride, hydrocodone, Meloxicam, metformin, Robaxin, and vitamin B. SHE IS ALLERGIC TO IBUPROFEN AND SULFA CONTAINING MEDICATIONS.   IMPRESSION/PLAN:                             I explained the procedure to Diane Stevenson and her family. They understood and wish to proceed. We will get this scheduled for next week. Diane Stevenson will receive instructions from our office with regards to the operation next week, Thursday. Risks and benefits, bleeding, infection, no changes in her pain, possible cement placement in the epidural venous system were all discusses. She understands and wishes to proceed.

## 2013-01-27 NOTE — Anesthesia Preprocedure Evaluation (Addendum)
Anesthesia Evaluation  Patient identified by MRN, date of birth, ID band Patient awake    Reviewed: Allergy & Precautions, H&P , NPO status , Patient's Chart, lab work & pertinent test results  Airway Mallampati: II TM Distance: >3 FB Neck ROM: Full    Dental  (+) Teeth Intact and Dental Advisory Given   Pulmonary neg pulmonary ROS,    Pulmonary exam normal       Cardiovascular hypertension, Pt. on medications     Neuro/Psych negative neurological ROS     GI/Hepatic Neg liver ROS, GERD-  Controlled,  Endo/Other  diabetes, Well Controlled, Type 2, Oral Hypoglycemic Agents  Renal/GU negative Renal ROS     Musculoskeletal  (+) Arthritis -, Osteoarthritis,    Abdominal Normal abdominal exam  (+)   Peds  Hematology negative hematology ROS (+)   Anesthesia Other Findings   Reproductive/Obstetrics                          Anesthesia Physical Anesthesia Plan  ASA: II  Anesthesia Plan: General   Post-op Pain Management:    Induction: Intravenous  Airway Management Planned: Oral ETT  Additional Equipment:   Intra-op Plan:   Post-operative Plan: Extubation in OR  Informed Consent: I have reviewed the patients History and Physical, chart, labs and discussed the procedure including the risks, benefits and alternatives for the proposed anesthesia with the patient or authorized representative who has indicated his/her understanding and acceptance.   Dental advisory given  Plan Discussed with: CRNA and Surgeon  Anesthesia Plan Comments:        Anesthesia Quick Evaluation

## 2013-01-28 ENCOUNTER — Encounter (HOSPITAL_COMMUNITY): Payer: Self-pay | Admitting: Neurosurgery

## 2013-01-28 LAB — GLUCOSE, CAPILLARY: Glucose-Capillary: 76 mg/dL (ref 70–99)

## 2013-01-28 MED ORDER — HYDROCODONE-ACETAMINOPHEN 5-325 MG PO TABS: 1.0000 | ORAL_TABLET | Freq: Four times a day (QID) | ORAL | Status: DC | PRN

## 2013-01-28 NOTE — Plan of Care (Signed)
Problem: Consults Goal: Diagnosis - Spinal Surgery Outcome: Completed/Met Date Met:  01/28/13 Microdiscectomy kyphoplasty

## 2013-01-28 NOTE — Progress Notes (Signed)
Pt and husband given D/C instructions with Rx, verbal understanding was given. Pt D/C'd home via wheelchair @ 1315 per MD order. Rema Fendt, RN

## 2013-01-28 NOTE — Discharge Summary (Signed)
  Admitting diagnosis:T11,12 compression fractures Discharge Diagnosis: T11, 12 compression fractures Status: Alive and well Destination: Home Procedure: Kyphoplasty T11, 12 Complications: none Surgeon:Dareon Nunziato Alert and oriented x 4, moving and walking well. Wounds are clean, dry,and without signs of infection.  Tolerating a regular diet.  Medications: Hydrocodone/Acetaminophen

## 2013-01-28 NOTE — Plan of Care (Signed)
Problem: Consults Goal: Diagnosis - Spinal Surgery Kyphoplasty     

## 2013-06-08 ENCOUNTER — Encounter (HOSPITAL_COMMUNITY): Payer: Self-pay | Admitting: Emergency Medicine

## 2013-06-08 ENCOUNTER — Emergency Department (HOSPITAL_COMMUNITY): Payer: Medicare Other

## 2013-06-08 ENCOUNTER — Inpatient Hospital Stay (HOSPITAL_COMMUNITY)
Admission: EM | Admit: 2013-06-08 | Discharge: 2013-06-13 | DRG: 512 | Disposition: A | Discharge status: Skilled Nursing Facility | Payer: Medicare Other | Attending: Orthopedic Surgery | Admitting: Orthopedic Surgery

## 2013-06-08 DIAGNOSIS — S52601A Unspecified fracture of lower end of right ulna, initial encounter for closed fracture: Secondary | ICD-10-CM | POA: Diagnosis present

## 2013-06-08 DIAGNOSIS — W19XXXA Unspecified fall, initial encounter: Secondary | ICD-10-CM | POA: Diagnosis present

## 2013-06-08 DIAGNOSIS — I10 Essential (primary) hypertension: Secondary | ICD-10-CM | POA: Diagnosis present

## 2013-06-08 DIAGNOSIS — E119 Type 2 diabetes mellitus without complications: Secondary | ICD-10-CM | POA: Diagnosis present

## 2013-06-08 DIAGNOSIS — Z9181 History of falling: Secondary | ICD-10-CM

## 2013-06-08 DIAGNOSIS — S0180XA Unspecified open wound of other part of head, initial encounter: Secondary | ICD-10-CM | POA: Diagnosis present

## 2013-06-08 DIAGNOSIS — S52501A Unspecified fracture of the lower end of right radius, initial encounter for closed fracture: Secondary | ICD-10-CM

## 2013-06-08 DIAGNOSIS — K219 Gastro-esophageal reflux disease without esophagitis: Secondary | ICD-10-CM | POA: Diagnosis present

## 2013-06-08 DIAGNOSIS — S52609A Unspecified fracture of lower end of unspecified ulna, initial encounter for closed fracture: Principal | ICD-10-CM

## 2013-06-08 DIAGNOSIS — S52509A Unspecified fracture of the lower end of unspecified radius, initial encounter for closed fracture: Principal | ICD-10-CM | POA: Diagnosis present

## 2013-06-08 DIAGNOSIS — Y92009 Unspecified place in unspecified non-institutional (private) residence as the place of occurrence of the external cause: Secondary | ICD-10-CM

## 2013-06-08 DIAGNOSIS — Z96649 Presence of unspecified artificial hip joint: Secondary | ICD-10-CM

## 2013-06-08 LAB — BASIC METABOLIC PANEL
BUN: 11 mg/dL (ref 6–23)
CHLORIDE: 95 meq/L — AB (ref 96–112)
CO2: 26 meq/L (ref 19–32)
Calcium: 9 mg/dL (ref 8.4–10.5)
Creatinine, Ser: 0.55 mg/dL (ref 0.50–1.10)
GFR calc Af Amer: >90 mL/min (ref >90)
GFR calc non Af Amer: 86 mL/min — ABNORMAL LOW (ref >90)
Glucose, Bld: 285 mg/dL — ABNORMAL HIGH (ref 70–99)
POTASSIUM: 4.3 meq/L (ref 3.7–5.3)
SODIUM: 134 meq/L — AB (ref 137–147)

## 2013-06-08 LAB — CBC
HCT: 37.5 % (ref 36.0–46.0)
Hemoglobin: 13.1 g/dL (ref 12.0–15.0)
MCH: 34.9 pg — ABNORMAL HIGH (ref 26.0–34.0)
MCHC: 34.9 g/dL (ref 30.0–36.0)
MCV: 100 fL (ref 78.0–100.0)
PLATELETS: 216 10*3/uL (ref 150–400)
RBC: 3.75 MIL/uL — AB (ref 3.87–5.11)
RDW: 14.2 % (ref 11.5–15.5)
WBC: 11.5 10*3/uL — AB (ref 4.0–10.5)

## 2013-06-08 MED ORDER — FERROUS SULFATE 325 (65 FE) MG PO TABS
325.0000 mg | ORAL_TABLET | Freq: Every day | ORAL | Status: DC
  Administered 2013-06-10 – 2013-06-13 (×4): 325 mg via ORAL
  Filled 2013-06-08 (×6): qty 1

## 2013-06-08 MED ORDER — METFORMIN HCL 500 MG PO TABS
1000.0000 mg | ORAL_TABLET | Freq: Two times a day (BID) | ORAL | Status: DC
  Administered 2013-06-10 – 2013-06-13 (×7): 1000 mg via ORAL
  Filled 2013-06-08 (×11): qty 2

## 2013-06-08 MED ORDER — ONDANSETRON HCL 4 MG PO TABS: 4.0000 mg | ORAL_TABLET | Freq: Four times a day (QID) | ORAL | Status: DC | PRN

## 2013-06-08 MED ORDER — POTASSIUM CHLORIDE 2 MEQ/ML IV SOLN
INTRAVENOUS | Status: DC
  Administered 2013-06-09: 02:00:00 via INTRAVENOUS
  Filled 2013-06-08 (×3): qty 1000

## 2013-06-08 MED ORDER — MORPHINE SULFATE 2 MG/ML IJ SOLN: 1.0000 mg | INTRAMUSCULAR | Status: DC | PRN

## 2013-06-08 MED ORDER — ONDANSETRON HCL 4 MG/2ML IJ SOLN
4.0000 mg | Freq: Four times a day (QID) | INTRAMUSCULAR | Status: DC | PRN
Start: 2013-06-08 — End: 2013-06-09
  Administered 2013-06-08: 4 mg via INTRAVENOUS
  Filled 2013-06-08: qty 2

## 2013-06-08 MED ORDER — OXYCODONE HCL 5 MG PO TABS
5.0000 mg | ORAL_TABLET | ORAL | Status: DC | PRN
  Administered 2013-06-09: 10 mg via ORAL
  Administered 2013-06-09: 5 mg via ORAL
  Filled 2013-06-08: qty 1
  Filled 2013-06-08: qty 2

## 2013-06-08 MED ORDER — VITAMIN C 500 MG PO TABS
1000.0000 mg | ORAL_TABLET | Freq: Every day | ORAL | Status: DC
  Filled 2013-06-08: qty 2

## 2013-06-08 MED ORDER — VITAMIN B-12 1000 MCG PO TABS
1000.0000 ug | ORAL_TABLET | Freq: Every day | ORAL | Status: DC
  Administered 2013-06-10 – 2013-06-13 (×4): 1000 ug via ORAL
  Filled 2013-06-08 (×5): qty 1

## 2013-06-08 MED ORDER — CEFAZOLIN SODIUM 1-5 GM-% IV SOLN
1.0000 g | Freq: Three times a day (TID) | INTRAVENOUS | Status: DC
  Administered 2013-06-08 – 2013-06-09 (×2): 1 g via INTRAVENOUS
  Administered 2013-06-09: 2 g via INTRAVENOUS
  Administered 2013-06-09: 1 g via INTRAVENOUS
  Filled 2013-06-08 (×5): qty 50

## 2013-06-08 MED ORDER — HYDROCODONE-ACETAMINOPHEN 5-325 MG PO TABS
1.0000 | ORAL_TABLET | ORAL | Status: DC | PRN
  Administered 2013-06-09: 2 via ORAL
  Filled 2013-06-08: qty 2

## 2013-06-08 MED ORDER — DOCUSATE SODIUM 100 MG PO CAPS
100.0000 mg | ORAL_CAPSULE | Freq: Two times a day (BID) | ORAL | Status: DC
  Filled 2013-06-08 (×2): qty 1

## 2013-06-08 MED ORDER — METHOCARBAMOL 500 MG PO TABS
500.0000 mg | ORAL_TABLET | Freq: Four times a day (QID) | ORAL | Status: DC | PRN
  Administered 2013-06-09: 500 mg via ORAL
  Filled 2013-06-08: qty 1

## 2013-06-08 MED ORDER — METHOCARBAMOL 100 MG/ML IJ SOLN
500.0000 mg | Freq: Four times a day (QID) | INTRAVENOUS | Status: DC | PRN
  Filled 2013-06-08: qty 5

## 2013-06-08 MED ORDER — RAMIPRIL 10 MG PO CAPS
10.0000 mg | ORAL_CAPSULE | Freq: Every morning | ORAL | Status: DC
  Administered 2013-06-10 – 2013-06-13 (×4): 10 mg via ORAL
  Filled 2013-06-08 (×5): qty 1

## 2013-06-08 MED ORDER — FENTANYL CITRATE 0.05 MG/ML IJ SOLN
INTRAMUSCULAR | Status: AC
  Administered 2013-06-09: 50 ug
  Filled 2013-06-08: qty 4

## 2013-06-08 MED ORDER — ALPRAZOLAM 0.5 MG PO TABS
0.5000 mg | ORAL_TABLET | Freq: Four times a day (QID) | ORAL | Status: DC | PRN
  Administered 2013-06-09: 0.5 mg via ORAL
  Filled 2013-06-08: qty 1

## 2013-06-08 MED ORDER — FENTANYL CITRATE 0.05 MG/ML IJ SOLN
100.0000 ug | Freq: Once | INTRAMUSCULAR | Status: AC
  Administered 2013-06-08: 100 ug via INTRAVENOUS
  Filled 2013-06-08: qty 2

## 2013-06-08 MED ORDER — ADULT MULTIVITAMIN W/MINERALS CH
1.0000 | ORAL_TABLET | Freq: Every day | ORAL | Status: DC
  Filled 2013-06-08: qty 1

## 2013-06-08 MED ORDER — DIPHENHYDRAMINE HCL 25 MG PO CAPS
25.0000 mg | ORAL_CAPSULE | Freq: Four times a day (QID) | ORAL | Status: DC | PRN
Start: 2013-06-08 — End: 2013-06-09

## 2013-06-08 MED ORDER — CEFAZOLIN SODIUM 1-5 GM-% IV SOLN
1.0000 g | Freq: Once | INTRAVENOUS | Status: AC
  Administered 2013-06-08: 1 g via INTRAVENOUS
  Filled 2013-06-08: qty 50

## 2013-06-08 NOTE — ED Notes (Signed)
Ortho Tech called for splinting with EDP and Resident.

## 2013-06-08 NOTE — ED Notes (Addendum)
Pt reports to the ED for eval following a fall from standing. Pt fell on her wrist and has an open wrist fracture to the right wrist. CMS intact. Full ROM limited. Pt also has a laceration to the right anterior eye. Bleeding is controlled. Pt takes an ASA a day. Denies LOC or syncope. Reports she tripped over her walker and fell. Pt was given 100 mcg of Fentanyl and 4 mg of Zofran en route. Pts pain went from a 10/10 to 4/10. Pt hypertensive en route. Pt complaining of right wrist pain and some hip pain that was existing prior to the fall. Pt A&Ox4, resp e/u, and skin warm and dry.

## 2013-06-08 NOTE — ED Provider Notes (Signed)
CSN: [id number]     Arrival date & time 06/08/13  2133 History   First MD Initiated Contact with Patient 06/08/13 2154     Chief Complaint  Patient presents with  . Fall   (Consider location/radiation/quality/duration/timing/severity/associated sxs/prior Treatment) Patient is a 80 y.o. female presenting with fall and trauma.  Fall Pertinent negatives include no abdominal pain, chest pain, chills, coughing, fever, headaches, nausea, numbness, rash, sore throat or vomiting.  Trauma Mechanism of injury: fall Injury location: hand Incident location: home   Fall:      Fall occurred: walking      Impact surface: carpet      Point of impact: outstretched arms  EMS/PTA data:      Bystander interventions: splinting      Blood loss: minimal  Current symptoms:      Associated symptoms:            Denies abdominal pain, back pain, chest pain, headache, nausea and vomiting.    Past Medical History  Diagnosis Date  . Breast cancer 08/04/1995    left  . Hypertension   . Type 2 diabetes mellitus   . Dyspareunia   . Diabetes mellitus   . GERD (gastroesophageal reflux disease)   . DJD (degenerative joint disease)     back, R arm & wrist, hands    Past Surgical History  Procedure Laterality Date  . Appendectomy    . Breast surgery  1998     Left Breast  . Breast surgery  1993    Right Breast  . Breast surgery  1997    Left Breast  . Partial hip arthroplasty  1998    Left side  . Foot arthrodesis, modified mcbride      1984  . Bone spur      1984  . Joint replacement      rt knee  . Total hip arthroplasty  08/20/2011    Procedure: TOTAL HIP ARTHROPLASTY;  Surgeon: Kerin Salen, MD;  Location: [facility];  Service: Orthopedics;  Laterality: Right;  DEPUY/PINNACLE CUP,SUMMIT BASIC STEM  . Eye surgery      cataracts removed, both eyes   . Fracture surgery      /L wrist- plate- 2004  . Kyphoplasty N/A 01/27/2013    Procedure: THORACIC ELEVEN, THORACIC TWELVE KYPHOPLASTY;  Surgeon:  Winfield Cunas, MD;  Location: [facility];  Service: Neurosurgery;  Laterality: N/A;  T11 and T12 kyphoplasty   Family History  Problem Relation Age of Onset  . Heart disease Brother    History  Substance Use Topics  . Smoking status: Never Smoker   . Smokeless tobacco: Never Used  . Alcohol Use: No   OB History   Grav Para Term Preterm Abortions TAB SAB Ect Mult Living                 Review of Systems  Constitutional: Negative for fever and chills.  HENT: Negative for sore throat.   Eyes: Negative for pain.  Respiratory: Negative for cough and shortness of breath.   Cardiovascular: Negative for chest pain.  Gastrointestinal: Negative for nausea, vomiting, abdominal pain and diarrhea.  Genitourinary: Negative for dysuria.  Musculoskeletal: Negative for back pain.  Skin: Positive for wound. Negative for rash.  Neurological: Negative for numbness and headaches.    Allergies  Celebrex; Sulfur; Ibuprofen; Statins; and Nitrofuran derivatives  Home Medications   No current outpatient prescriptions on file. BP 158/97  Pulse 91  Temp(Src) 98 F (  36.7 C) (Oral)  Resp 18  SpO2 93% Physical Exam  Constitutional: She is oriented to person, place, and time. She appears well-developed and well-nourished. No distress.  HENT:  Head: Normocephalic and atraumatic.    Eyes: Pupils are equal, round, and reactive to light. Right eye exhibits no discharge. Left eye exhibits no discharge.  Neck: Normal range of motion.  Cardiovascular: Normal rate, regular rhythm and normal heart sounds.   Pulmonary/Chest: Effort normal and breath sounds normal.  Abdominal: Soft. She exhibits no distension. There is no tenderness.  Musculoskeletal: Normal range of motion.       Right wrist: She exhibits bony tenderness and deformity.  Neurological: She is alert and oriented to person, place, and time.  Skin: Skin is warm. She is not diaphoretic.    ED Course  Reduction of fracture Date/Time:  06/09/2013 12:37 AM Performed by: Freddi Che Authorized by: Freddi Che Consent: Verbal consent obtained. Time out: Immediately prior to procedure a "time out" was called to verify the correct patient, procedure, equipment, support staff and site/side marked as required. Preparation: Patient was prepped and draped in the usual sterile fashion. Local anesthesia used: no Patient sedated: no Patient tolerance: Patient tolerated the procedure well with no immediate complications.   (including critical care time) Labs Review Labs Reviewed  CBC - Abnormal; Notable for the following:    WBC 11.5 (*)    RBC 3.75 (*)    MCH 34.9 (*)    All other components within normal limits  BASIC METABOLIC PANEL - Abnormal; Notable for the following:    Sodium 134 (*)    Chloride 95 (*)    Glucose, Bld 285 (*)    GFR calc non Af Amer 86 (*)    All other components within normal limits   Imaging Review Dg Pelvis 1-2 Views  06/08/2013   CLINICAL DATA:  Fall, right-sided pelvic pain  EXAM: PELVIS - 1-2 VIEW  COMPARISON:  Prior radiograph from 08/20/2011  FINDINGS: Bilateral total hip arthroplasties are again seen, stable in position and alignment as compared to prior studies. Hardware is intact. No periprosthetic fracture. No dislocation. SI joints are approximated. No pubic dye stasis.  No soft tissue abnormality.  IMPRESSION: Stable appearance of bilateral total hip arthroplasties without evidence of hardware complication. No acute fracture or dislocation.   Electronically Signed   By: Jeannine Boga M.D.   On: 06/08/2013 22:49   Dg Wrist Complete Right  06/08/2013   CLINICAL DATA:  Fall with wrist fracture per  EXAM: RIGHT WRIST - COMPLETE 3+ VIEW  COMPARISON:  08/19/2012  FINDINGS: There are oblique fractures through the distal radial and ulnar metadiaphyses. The fractures are displaced by 100% radially. No intra-articular extension visible. Radiocarpal articulation intact.  IMPRESSION: Displaced,  extra-articular distal radial and ulnar fractures.   Electronically Signed   By: Jorje Guild M.D.   On: 06/08/2013 23:14   Ct Head Wo Contrast  06/08/2013   CLINICAL DATA:  Fall with laceration over the eye.  EXAM: CT HEAD WITHOUT CONTRAST  CT CERVICAL SPINE WITHOUT CONTRAST  TECHNIQUE: Multidetector CT imaging of the head and cervical spine was performed following the standard protocol without intravenous contrast. Multiplanar CT image reconstructions of the cervical spine were also generated.  COMPARISON:  08/19/2012  FINDINGS: CT HEAD FINDINGS  Skull and Sinuses:No significant abnormality.  Orbits: No acute abnormality.  Brain: No evidence of acute abnormality, such as acute infarction, hemorrhage, hydrocephalus, or mass lesion/mass effect. Generalized brain atrophy. Extensive  chronic small vessel ischemic white matter disease, with low-attenuation confluent around the lateral ventricles. Lacune in the lower right putamen.  CT CERVICAL SPINE FINDINGS  Negative for acute fracture. No prevertebral edema. No gross cervical canal hematoma. There is mild anterolisthesis present at C3-4, C4-5, and C5-6, associated with facet osteoarthritis, the most likely explanation. Diffuse degenerative disc and facet change, without focally advanced osseous canal or foraminal stenosis.  IMPRESSION: 1. No evidence of acute intracranial injury. 2. Senescent changes include atrophy and chronic small vessel ischemia. 3. Negative for cervical spine fracture. 4. Multilevel, mild anterolisthesis is associated with facet osteoarthritis - the most likely explanation. If there are concerning findings at C-spine clearance, the patient can be evaluated for ligamentous injury.   Electronically Signed   By: Jorje Guild M.D.   On: 06/08/2013 22:55   Ct Cervical Spine Wo Contrast  06/08/2013   CLINICAL DATA:  Fall with laceration over the eye.  EXAM: CT HEAD WITHOUT CONTRAST  CT CERVICAL SPINE WITHOUT CONTRAST  TECHNIQUE:  Multidetector CT imaging of the head and cervical spine was performed following the standard protocol without intravenous contrast. Multiplanar CT image reconstructions of the cervical spine were also generated.  COMPARISON:  08/19/2012  FINDINGS: CT HEAD FINDINGS  Skull and Sinuses:No significant abnormality.  Orbits: No acute abnormality.  Brain: No evidence of acute abnormality, such as acute infarction, hemorrhage, hydrocephalus, or mass lesion/mass effect. Generalized brain atrophy. Extensive chronic small vessel ischemic white matter disease, with low-attenuation confluent around the lateral ventricles. Lacune in the lower right putamen.  CT CERVICAL SPINE FINDINGS  Negative for acute fracture. No prevertebral edema. No gross cervical canal hematoma. There is mild anterolisthesis present at C3-4, C4-5, and C5-6, associated with facet osteoarthritis, the most likely explanation. Diffuse degenerative disc and facet change, without focally advanced osseous canal or foraminal stenosis.  IMPRESSION: 1. No evidence of acute intracranial injury. 2. Senescent changes include atrophy and chronic small vessel ischemia. 3. Negative for cervical spine fracture. 4. Multilevel, mild anterolisthesis is associated with facet osteoarthritis - the most likely explanation. If there are concerning findings at C-spine clearance, the patient can be evaluated for ligamentous injury.   Electronically Signed   By: Jorje Guild M.D.   On: 06/08/2013 22:55    EKG Interpretation    Date/Time:    Ventricular Rate:    PR Interval:    QRS Duration:   QT Interval:    QTC Calculation:   R Axis:     Text Interpretation:             MDM   1. Right distal ulnar fracture   2. Distal radius fracture, right     80 yo F with hx of OA presents s/p fall from height onto R wrist, presenting with obvious deformity.   Given age, comorbidities, will obtain basic films as well as CT head/neck. Open fracture on exam. Will  given antibiotics. Hand surgery consulted. To follow.   I spoke with a hand surgeon, who reviewed the x-rays and requested that we reduce the wrist in the emergency room. The patient was given an additional 100 mcg of fentanyl for pain control. Her wrist was reduced with mild axial traction and placed in a sugar tong splint. The patient will be admitted to the orthopedic service for possible surgery in the morning. Patient was transferred to the floor in stable condition.  Patient seen and evaluated by myself and my attending, Dr. Stark Jock.      Richardson Landry  Silvio Clayman, MD 06/09/13 0040

## 2013-06-08 NOTE — H&P (Signed)
Diane Stevenson is an 80 y.o. female.   Chief Complaint: right wrist injury HPI: Pt fell at home sustaining closed right distal radius fracture Pt had reduction in ED pt admitted to undergo surgery on right radius and ulna Pt with h/o right distal radius fracture without surgery  Past Medical History  Diagnosis Date  . Breast cancer 08/04/1995    left  . Hypertension   . Type 2 diabetes mellitus   . Dyspareunia   . Diabetes mellitus   . GERD (gastroesophageal reflux disease)   . DJD (degenerative joint disease)     back, R arm & wrist, hands     Past Surgical History  Procedure Laterality Date  . Appendectomy    . Breast surgery  1998     Left Breast  . Breast surgery  1993    Right Breast  . Breast surgery  1997    Left Breast  . Partial hip arthroplasty  1998    Left side  . Foot arthrodesis, modified mcbride      1984  . Bone spur      1984  . Joint replacement      rt knee  . Total hip arthroplasty  08/20/2011    Procedure: TOTAL HIP ARTHROPLASTY;  Surgeon: Kerin Salen, MD;  Location: [facility];  Service: Orthopedics;  Laterality: Right;  DEPUY/PINNACLE CUP,SUMMIT BASIC STEM  . Eye surgery      cataracts removed, both eyes   . Fracture surgery      /L wrist- plate- 2004  . Kyphoplasty N/A 01/27/2013    Procedure: THORACIC ELEVEN, THORACIC TWELVE KYPHOPLASTY;  Surgeon: Winfield Cunas, MD;  Location: [facility];  Service: Neurosurgery;  Laterality: N/A;  T11 and T12 kyphoplasty    Family History  Problem Relation Age of Onset  . Heart disease Brother    Social History:  reports that she has never smoked. She has never used smokeless tobacco. She reports that she does not drink alcohol or use illicit drugs.  Allergies:  Allergies  Allergen Reactions  . Celebrex [Celecoxib] Hives and Swelling    Caused lips to swell.  . Sulfur Hives and Swelling    Caused lips to swell  . Ibuprofen Itching and Swelling  . Statins Other (See Comments)    cramps  . Nitrofuran  Derivatives Rash    Medications Prior to Admission  Medication Sig Dispense Refill  . alendronate (FOSAMAX) 70 MG tablet Take 70 mg by mouth once a week. Take with a full glass of water on an empty stomach. Take on Wednesday      . aspirin 81 MG tablet Take 81 mg by mouth daily.      . calcium carbonate (TUMS EX) 750 MG chewable tablet Chew 1 tablet by mouth 2 (two) times daily as needed for heartburn.      . Calcium Carbonate-Vitamin D (CALCIUM 600 + D PO) Take 1 tablet by mouth 2 (two) times daily.      . cholecalciferol (VITAMIN D) 1000 UNITS tablet Take 1,000 Units by mouth at bedtime.       . ferrous sulfate 325 (65 FE) MG tablet Take 325 mg by mouth daily with breakfast.      . HYDROcodone-acetaminophen (NORCO/VICODIN) 5-325 MG per tablet Take 1 tablet by mouth every 6 (six) hours as needed for pain.  70 tablet  0  . metFORMIN (GLUCOPHAGE) 1000 MG tablet Take 1,000 mg by mouth 2 (two) times daily with a  meal.      . ramipril (ALTACE) 10 MG capsule Take 10 mg by mouth every morning.       . vitamin B-12 (CYANOCOBALAMIN) 1000 MCG tablet Take 1,000 mcg by mouth daily.        Results for orders placed during the hospital encounter of 06/08/13 (from the past 48 hour(s))  CBC     Status: Abnormal   Collection Time    06/08/13 10:00 PM      Result Value Range   WBC 11.5 (*) 4.0 - 10.5 K/uL   RBC 3.75 (*) 3.87 - 5.11 MIL/uL   Hemoglobin 13.1  12.0 - 15.0 g/dL   HCT 37.5  36.0 - 46.0 %   MCV 100.0  78.0 - 100.0 fL   MCH 34.9 (*) 26.0 - 34.0 pg   MCHC 34.9  30.0 - 36.0 g/dL   RDW 14.2  11.5 - 15.5 %   Platelets 216  150 - 400 K/uL  BASIC METABOLIC PANEL     Status: Abnormal   Collection Time    06/08/13 10:00 PM      Result Value Range   Sodium 134 (*) 137 - 147 mEq/L   Potassium 4.3  3.7 - 5.3 mEq/L   Chloride 95 (*) 96 - 112 mEq/L   CO2 26  19 - 32 mEq/L   Glucose, Bld 285 (*) 70 - 99 mg/dL   BUN 11  6 - 23 mg/dL   Creatinine, Ser 0.55  0.50 - 1.10 mg/dL   Calcium 9.0  8.4 -  10.5 mg/dL   GFR calc non Af Amer 86 (*) >90 mL/min   GFR calc Af Amer >90  >90 mL/min   Comment: (NOTE)     The eGFR has been calculated using the CKD EPI equation.     This calculation has not been validated in all clinical situations.     eGFR's persistently <90 mL/min signify possible Chronic Kidney     Disease.  GLUCOSE, CAPILLARY     Status: Abnormal   Collection Time    06/09/13 12:48 AM      Result Value Range   Glucose-Capillary 256 (*) 70 - 99 mg/dL  SURGICAL PCR SCREEN     Status: None   Collection Time    06/09/13  2:08 AM      Result Value Range   MRSA, PCR NEGATIVE  NEGATIVE   Staphylococcus aureus NEGATIVE  NEGATIVE   Comment:            The Xpert SA Assay (FDA     approved for NASAL specimens     in patients over 15 years of age),     is one component of     a comprehensive surveillance     program.  Test performance has     been validated by [employer] for patients greater     than or equal to 75 year old.     It is not intended     to diagnose infection nor to     guide or monitor treatment.  BASIC METABOLIC PANEL     Status: Abnormal   Collection Time    06/09/13  4:00 AM      Result Value Range   Sodium 131 (*) 137 - 147 mEq/L   Potassium 4.8  3.7 - 5.3 mEq/L   Chloride 93 (*) 96 - 112 mEq/L   CO2 23  19 - 32 mEq/L  Glucose, Bld 269 (*) 70 - 99 mg/dL   BUN 10  6 - 23 mg/dL   Creatinine, Ser 5.66  0.50 - 1.10 mg/dL   Calcium 8.6  8.4 - 85.3 mg/dL   GFR calc non Af Amer 88 (*) >90 mL/min   GFR calc Af Amer >90  >90 mL/min   Comment: (NOTE)     The eGFR has been calculated using the CKD EPI equation.     This calculation has not been validated in all clinical situations.     eGFR's persistently <90 mL/min signify possible Chronic Kidney     Disease.  GLUCOSE, CAPILLARY     Status: Abnormal   Collection Time    06/09/13  6:30 AM      Result Value Range   Glucose-Capillary 226 (*) 70 - 99 mg/dL   Dg Pelvis 1-2 Views  06/08/2013   CLINICAL  DATA:  Fall, right-sided pelvic pain  EXAM: PELVIS - 1-2 VIEW  COMPARISON:  Prior radiograph from 08/20/2011  FINDINGS: Bilateral total hip arthroplasties are again seen, stable in position and alignment as compared to prior studies. Hardware is intact. No periprosthetic fracture. No dislocation. SI joints are approximated. No pubic dye stasis.  No soft tissue abnormality.  IMPRESSION: Stable appearance of bilateral total hip arthroplasties without evidence of hardware complication. No acute fracture or dislocation.   Electronically Signed   By: Rise Mu M.D.   On: 06/08/2013 22:49   Dg Wrist Complete Right  06/08/2013   CLINICAL DATA:  Fall with wrist fracture per  EXAM: RIGHT WRIST - COMPLETE 3+ VIEW  COMPARISON:  08/19/2012  FINDINGS: There are oblique fractures through the distal radial and ulnar metadiaphyses. The fractures are displaced by 100% radially. No intra-articular extension visible. Radiocarpal articulation intact.  IMPRESSION: Displaced, extra-articular distal radial and ulnar fractures.   Electronically Signed   By: Tiburcio Pea M.D.   On: 06/08/2013 23:14   Ct Head Wo Contrast  06/08/2013   CLINICAL DATA:  Fall with laceration over the eye.  EXAM: CT HEAD WITHOUT CONTRAST  CT CERVICAL SPINE WITHOUT CONTRAST  TECHNIQUE: Multidetector CT imaging of the head and cervical spine was performed following the standard protocol without intravenous contrast. Multiplanar CT image reconstructions of the cervical spine were also generated.  COMPARISON:  08/19/2012  FINDINGS: CT HEAD FINDINGS  Skull and Sinuses:No significant abnormality.  Orbits: No acute abnormality.  Brain: No evidence of acute abnormality, such as acute infarction, hemorrhage, hydrocephalus, or mass lesion/mass effect. Generalized brain atrophy. Extensive chronic small vessel ischemic white matter disease, with low-attenuation confluent around the lateral ventricles. Lacune in the lower right putamen.  CT CERVICAL SPINE  FINDINGS  Negative for acute fracture. No prevertebral edema. No gross cervical canal hematoma. There is mild anterolisthesis present at C3-4, C4-5, and C5-6, associated with facet osteoarthritis, the most likely explanation. Diffuse degenerative disc and facet change, without focally advanced osseous canal or foraminal stenosis.  IMPRESSION: 1. No evidence of acute intracranial injury. 2. Senescent changes include atrophy and chronic small vessel ischemia. 3. Negative for cervical spine fracture. 4. Multilevel, mild anterolisthesis is associated with facet osteoarthritis - the most likely explanation. If there are concerning findings at C-spine clearance, the patient can be evaluated for ligamentous injury.   Electronically Signed   By: Tiburcio Pea M.D.   On: 06/08/2013 22:55   Ct Cervical Spine Wo Contrast  06/08/2013   CLINICAL DATA:  Fall with laceration over the eye.  EXAM: CT HEAD  WITHOUT CONTRAST  CT CERVICAL SPINE WITHOUT CONTRAST  TECHNIQUE: Multidetector CT imaging of the head and cervical spine was performed following the standard protocol without intravenous contrast. Multiplanar CT image reconstructions of the cervical spine were also generated.  COMPARISON:  08/19/2012  FINDINGS: CT HEAD FINDINGS  Skull and Sinuses:No significant abnormality.  Orbits: No acute abnormality.  Brain: No evidence of acute abnormality, such as acute infarction, hemorrhage, hydrocephalus, or mass lesion/mass effect. Generalized brain atrophy. Extensive chronic small vessel ischemic white matter disease, with low-attenuation confluent around the lateral ventricles. Lacune in the lower right putamen.  CT CERVICAL SPINE FINDINGS  Negative for acute fracture. No prevertebral edema. No gross cervical canal hematoma. There is mild anterolisthesis present at C3-4, C4-5, and C5-6, associated with facet osteoarthritis, the most likely explanation. Diffuse degenerative disc and facet change, without focally advanced osseous  canal or foraminal stenosis.  IMPRESSION: 1. No evidence of acute intracranial injury. 2. Senescent changes include atrophy and chronic small vessel ischemia. 3. Negative for cervical spine fracture. 4. Multilevel, mild anterolisthesis is associated with facet osteoarthritis - the most likely explanation. If there are concerning findings at C-spine clearance, the patient can be evaluated for ligamentous injury.   Electronically Signed   By: Jorje Guild M.D.   On: 06/08/2013 22:55    ROS  NO RECENT ILLNESSES OR HOSPITALIZATIONS  Blood pressure 154/77, pulse 89, temperature 97.8 F (36.6 C), temperature source Oral, resp. rate 18, height $RemoveBe'5\' 5"'TXLSueBHv$  (1.651 m), weight 62.596 kg (138 lb), SpO2 98.00%. Physical Exam  General Appearance:  Alert, cooperative, no distress, appears stated age  Head:  Normocephalic, without obvious abnormality,  Eyes:  Pupils equal, conjunctiva/corneas clear, SMALL AMOUNT OF PERIORBITAL ECCHYMOSIS        Throat: Lips, mucosa, and tongue normal; teeth and gums normal  Neck: No visible masses     Lungs:   respirations unlabored  Chest Wall:  No tenderness or deformity  Heart:  Regular rate and rhythm,  Abdomen:   Soft, non-tender,         Extremities: SPLINT IN PLACE WIGGLES FINGERS, FINGERS WARM WELL PERFUSED  Pulses: 2+ and symmetric  Skin: Skin color, texture, turgor normal, no rashes or lesions     Neurologic: Normal    Assessment/Plan RIGHT DISTAL RADIUS AND ULNA FRACTURE DISPLACED  TO OR TODAY FOR OPEN REDUCTION AND SKELETAL STABILIZATION, OPEN REDUCTION AND INTERNAL FIXATION WILL DISCUSS WITH PATIENT AND FAMILY  R/B/A DISCUSSED WITH PT IN HOSPITAL.  PT VOICED UNDERSTANDING OF PLAN CONSENT SIGNED DAY OF SURGERY PT SEEN AND EXAMINED PRIOR TO OPERATIVE PROCEDURE/DAY OF SURGERY SITE MARKED. QUESTIONS ANSWERED WILL REMAIN AN INPATIENT FOLLOWING SURGERY  Linna Hoff 06/09/2013, 1805

## 2013-06-09 ENCOUNTER — Encounter (HOSPITAL_COMMUNITY): Payer: Medicare Other | Admitting: Anesthesiology

## 2013-06-09 ENCOUNTER — Inpatient Hospital Stay (HOSPITAL_COMMUNITY): Payer: Medicare Other | Admitting: Anesthesiology

## 2013-06-09 ENCOUNTER — Encounter (HOSPITAL_COMMUNITY): Payer: Self-pay | Admitting: Anesthesiology

## 2013-06-09 ENCOUNTER — Encounter (HOSPITAL_COMMUNITY)
Admission: EM | Disposition: A | Discharge status: Skilled Nursing Facility | Payer: Self-pay | Source: Home / Self Care | Attending: Orthopedic Surgery

## 2013-06-09 DIAGNOSIS — S52501A Unspecified fracture of the lower end of right radius, initial encounter for closed fracture: Secondary | ICD-10-CM | POA: Diagnosis present

## 2013-06-09 HISTORY — PX: OPEN REDUCTION INTERNAL FIXATION (ORIF) DISTAL RADIAL FRACTURE: SHX5989

## 2013-06-09 LAB — GLUCOSE, CAPILLARY
GLUCOSE-CAPILLARY: 126 mg/dL — AB (ref 70–99)
GLUCOSE-CAPILLARY: 256 mg/dL — AB (ref 70–99)
Glucose-Capillary: 124 mg/dL — ABNORMAL HIGH (ref 70–99)
Glucose-Capillary: 130 mg/dL — ABNORMAL HIGH (ref 70–99)
Glucose-Capillary: 158 mg/dL — ABNORMAL HIGH (ref 70–99)
Glucose-Capillary: 184 mg/dL — ABNORMAL HIGH (ref 70–99)
Glucose-Capillary: 226 mg/dL — ABNORMAL HIGH (ref 70–99)

## 2013-06-09 LAB — BASIC METABOLIC PANEL
BUN: 10 mg/dL (ref 6–23)
CHLORIDE: 93 meq/L — AB (ref 96–112)
CO2: 23 mEq/L (ref 19–32)
CREATININE: 0.51 mg/dL (ref 0.50–1.10)
Calcium: 8.6 mg/dL (ref 8.4–10.5)
GFR, EST NON AFRICAN AMERICAN: 88 mL/min — AB (ref >90)
Glucose, Bld: 269 mg/dL — ABNORMAL HIGH (ref 70–99)
Potassium: 4.8 mEq/L (ref 3.7–5.3)
Sodium: 131 mEq/L — ABNORMAL LOW (ref 137–147)

## 2013-06-09 LAB — SURGICAL PCR SCREEN
MRSA, PCR: NEGATIVE
Staphylococcus aureus: NEGATIVE

## 2013-06-09 SURGERY — OPEN REDUCTION INTERNAL FIXATION (ORIF) DISTAL RADIUS FRACTURE
Anesthesia: General | Site: Wrist | Laterality: Right

## 2013-06-09 MED ORDER — DOCUSATE SODIUM 100 MG PO CAPS
100.0000 mg | ORAL_CAPSULE | Freq: Two times a day (BID) | ORAL | Status: DC
  Administered 2013-06-10 – 2013-06-13 (×7): 100 mg via ORAL
  Filled 2013-06-09 (×8): qty 1

## 2013-06-09 MED ORDER — OXYCODONE HCL 5 MG PO TABS: 5.0000 mg | ORAL_TABLET | Freq: Once | ORAL | Status: DC | PRN

## 2013-06-09 MED ORDER — ONDANSETRON HCL 4 MG PO TABS
4.0000 mg | ORAL_TABLET | Freq: Four times a day (QID) | ORAL | Status: DC | PRN
  Administered 2013-06-11 (×2): 4 mg via ORAL
  Filled 2013-06-09 (×2): qty 1

## 2013-06-09 MED ORDER — LIDOCAINE HCL (CARDIAC) 20 MG/ML IV SOLN
INTRAVENOUS | Status: DC | PRN
  Administered 2013-06-09: 50 mg via INTRAVENOUS

## 2013-06-09 MED ORDER — CHLORHEXIDINE GLUCONATE 4 % EX LIQD
60.0000 mL | Freq: Once | CUTANEOUS | Status: AC
  Administered 2013-06-09: 4 via TOPICAL
  Filled 2013-06-09: qty 60

## 2013-06-09 MED ORDER — LACTATED RINGERS IV SOLN
INTRAVENOUS | Status: DC | PRN
  Administered 2013-06-09 (×2): via INTRAVENOUS

## 2013-06-09 MED ORDER — PHENYLEPHRINE HCL 10 MG/ML IJ SOLN
INTRAMUSCULAR | Status: DC | PRN
  Administered 2013-06-09 (×2): 80 ug via INTRAVENOUS

## 2013-06-09 MED ORDER — ONDANSETRON HCL 4 MG/2ML IJ SOLN
INTRAMUSCULAR | Status: DC | PRN
  Administered 2013-06-09: 4 mg via INTRAVENOUS

## 2013-06-09 MED ORDER — BUPIVACAINE-EPINEPHRINE PF 0.5-1:200000 % IJ SOLN
INTRAMUSCULAR | Status: DC | PRN
  Administered 2013-06-09: 30 mL via PERINEURAL

## 2013-06-09 MED ORDER — CEFAZOLIN SODIUM 1-5 GM-% IV SOLN
1.0000 g | Freq: Three times a day (TID) | INTRAVENOUS | Status: DC
  Administered 2013-06-10 – 2013-06-13 (×10): 1 g via INTRAVENOUS
  Filled 2013-06-09 (×12): qty 50

## 2013-06-09 MED ORDER — METHOCARBAMOL 100 MG/ML IJ SOLN: 500.0000 mg | Freq: Four times a day (QID) | INTRAVENOUS | Status: DC | PRN

## 2013-06-09 MED ORDER — HYDROCODONE-ACETAMINOPHEN 5-325 MG PO TABS
1.0000 | ORAL_TABLET | ORAL | Status: DC | PRN
  Administered 2013-06-10: 1 via ORAL
  Filled 2013-06-09: qty 1

## 2013-06-09 MED ORDER — SUCCINYLCHOLINE CHLORIDE 20 MG/ML IJ SOLN
INTRAMUSCULAR | Status: DC | PRN
  Administered 2013-06-09: 100 mg via INTRAVENOUS

## 2013-06-09 MED ORDER — PROPOFOL 10 MG/ML IV BOLUS
INTRAVENOUS | Status: DC | PRN
  Administered 2013-06-09: 90 mg via INTRAVENOUS

## 2013-06-09 MED ORDER — METHOCARBAMOL 500 MG PO TABS
500.0000 mg | ORAL_TABLET | Freq: Four times a day (QID) | ORAL | Status: DC | PRN
  Administered 2013-06-11: 500 mg via ORAL
  Filled 2013-06-09: qty 1

## 2013-06-09 MED ORDER — OXYCODONE HCL 5 MG/5ML PO SOLN: 5.0000 mg | Freq: Once | ORAL | Status: DC | PRN

## 2013-06-09 MED ORDER — MORPHINE SULFATE 2 MG/ML IJ SOLN: 1.0000 mg | INTRAMUSCULAR | Status: DC | PRN

## 2013-06-09 MED ORDER — INSULIN ASPART 100 UNIT/ML ~~LOC~~ SOLN
0.0000 [IU] | SUBCUTANEOUS | Status: DC
  Administered 2013-06-09: 5 [IU] via SUBCUTANEOUS
  Administered 2013-06-10 (×2): 3 [IU] via SUBCUTANEOUS
  Administered 2013-06-10: 2 [IU] via SUBCUTANEOUS
  Administered 2013-06-10: 3 [IU] via SUBCUTANEOUS

## 2013-06-09 MED ORDER — CHLORHEXIDINE GLUCONATE 4 % EX LIQD
1.0000 "application " | Freq: Once | CUTANEOUS | Status: DC
  Filled 2013-06-09: qty 15

## 2013-06-09 MED ORDER — DIPHENHYDRAMINE HCL 25 MG PO CAPS: 25.0000 mg | ORAL_CAPSULE | Freq: Four times a day (QID) | ORAL | Status: DC | PRN

## 2013-06-09 MED ORDER — MEPERIDINE HCL 25 MG/ML IJ SOLN: 6.2500 mg | INTRAMUSCULAR | Status: DC | PRN

## 2013-06-09 MED ORDER — CEFAZOLIN SODIUM-DEXTROSE 2-3 GM-% IV SOLR
2.0000 g | INTRAVENOUS | Status: DC
  Filled 2013-06-09: qty 50

## 2013-06-09 MED ORDER — CEFAZOLIN SODIUM 1-5 GM-% IV SOLN
1.0000 g | INTRAVENOUS | Status: DC
  Filled 2013-06-09: qty 50

## 2013-06-09 MED ORDER — LACTATED RINGERS IV SOLN
INTRAVENOUS | Status: DC
  Administered 2013-06-10: 05:00:00 via INTRAVENOUS

## 2013-06-09 MED ORDER — VITAMIN C 500 MG PO TABS
1000.0000 mg | ORAL_TABLET | Freq: Every day | ORAL | Status: DC
  Administered 2013-06-10 – 2013-06-13 (×4): 1000 mg via ORAL
  Filled 2013-06-09 (×4): qty 2

## 2013-06-09 MED ORDER — FENTANYL CITRATE 0.05 MG/ML IJ SOLN
INTRAMUSCULAR | Status: DC | PRN
  Administered 2013-06-09 (×2): 50 ug via INTRAVENOUS

## 2013-06-09 MED ORDER — ONDANSETRON HCL 4 MG/2ML IJ SOLN: 4.0000 mg | Freq: Four times a day (QID) | INTRAMUSCULAR | Status: DC | PRN

## 2013-06-09 MED ORDER — HYDROMORPHONE HCL PF 1 MG/ML IJ SOLN: 0.2500 mg | INTRAMUSCULAR | Status: DC | PRN

## 2013-06-09 MED ORDER — ADULT MULTIVITAMIN W/MINERALS CH
1.0000 | ORAL_TABLET | Freq: Every day | ORAL | Status: DC
  Administered 2013-06-10 – 2013-06-13 (×4): 1 via ORAL
  Filled 2013-06-09 (×4): qty 1

## 2013-06-09 MED ORDER — OXYCODONE-ACETAMINOPHEN 5-325 MG PO TABS: 1.0000 | ORAL_TABLET | ORAL | Status: DC | PRN

## 2013-06-09 MED ORDER — ONDANSETRON HCL 4 MG/2ML IJ SOLN: 4.0000 mg | Freq: Once | INTRAMUSCULAR | Status: DC | PRN

## 2013-06-09 SURGICAL SUPPLY — 69 items
BANDAGE ELASTIC 3 VELCRO ST LF (GAUZE/BANDAGES/DRESSINGS) ×3 IMPLANT
BANDAGE ELASTIC 4 VELCRO ST LF (GAUZE/BANDAGES/DRESSINGS) ×3 IMPLANT
BANDAGE GAUZE ELAST BULKY 4 IN (GAUZE/BANDAGES/DRESSINGS) ×3 IMPLANT
BIT DRILL 2.2 SS TIBIAL (BIT) ×2 IMPLANT
BLADE SURG ROTATE 9660 (MISCELLANEOUS) IMPLANT
BNDG CMPR 9X4 STRL LF SNTH (GAUZE/BANDAGES/DRESSINGS) ×1
BNDG ESMARK 4X9 LF (GAUZE/BANDAGES/DRESSINGS) ×3 IMPLANT
CANISTER SUCTION 2500CC (MISCELLANEOUS) ×3 IMPLANT
CLOSURE WOUND 1/2 X4 (GAUZE/BANDAGES/DRESSINGS)
CLOTH BEACON ORANGE TIMEOUT ST (SAFETY) ×3 IMPLANT
CORDS BIPOLAR (ELECTRODE) ×3 IMPLANT
COVER SURGICAL LIGHT HANDLE (MISCELLANEOUS) ×3 IMPLANT
CUFF TOURNIQUET SINGLE 18IN (TOURNIQUET CUFF) ×3 IMPLANT
CUFF TOURNIQUET SINGLE 24IN (TOURNIQUET CUFF) IMPLANT
DRAIN TLS ROUND 10FR (DRAIN) IMPLANT
DRAPE OEC MINIVIEW 54X84 (DRAPES) ×3 IMPLANT
DRAPE SURG 17X11 SM STRL (DRAPES) ×3 IMPLANT
DRSG ADAPTIC 3X8 NADH LF (GAUZE/BANDAGES/DRESSINGS) ×3 IMPLANT
ELECT REM PT RETURN 9FT ADLT (ELECTROSURGICAL)
ELECTRODE REM PT RTRN 9FT ADLT (ELECTROSURGICAL) IMPLANT
GAUZE SPONGE 4X4 16PLY XRAY LF (GAUZE/BANDAGES/DRESSINGS) ×3 IMPLANT
GAUZE XEROFORM 1X8 LF (GAUZE/BANDAGES/DRESSINGS) ×2 IMPLANT
GLOVE BIOGEL PI IND STRL 8.5 (GLOVE) ×1 IMPLANT
GLOVE BIOGEL PI INDICATOR 8.5 (GLOVE) ×2
GLOVE SURG ORTHO 8.0 STRL STRW (GLOVE) ×3 IMPLANT
GOWN PREVENTION PLUS XLARGE (GOWN DISPOSABLE) ×3 IMPLANT
GOWN STRL NON-REIN LRG LVL3 (GOWN DISPOSABLE) ×3 IMPLANT
KIT BASIN OR (CUSTOM PROCEDURE TRAY) ×3 IMPLANT
KIT ROOM TURNOVER OR (KITS) ×3 IMPLANT
MANIFOLD NEPTUNE II (INSTRUMENTS) ×1 IMPLANT
NDL HYPO 25X1 1.5 SAFETY (NEEDLE) ×1 IMPLANT
NEEDLE HYPO 25X1 1.5 SAFETY (NEEDLE) ×3 IMPLANT
NS IRRIG 1000ML POUR BTL (IV SOLUTION) ×3 IMPLANT
PACK ORTHO EXTREMITY (CUSTOM PROCEDURE TRAY) ×3 IMPLANT
PAD ARMBOARD 7.5X6 YLW CONV (MISCELLANEOUS) ×6 IMPLANT
PAD CAST 4YDX4 CTTN HI CHSV (CAST SUPPLIES) ×1 IMPLANT
PADDING CAST ABS 4INX4YD NS (CAST SUPPLIES) ×2
PADDING CAST ABS COTTON 4X4 ST (CAST SUPPLIES) IMPLANT
PADDING CAST COTTON 4X4 STRL (CAST SUPPLIES) ×3
PEG LOCKING SMOOTH 2.2X18 (Peg) ×4 IMPLANT
PEG LOCKING SMOOTH 2.2X22 (Screw) ×4 IMPLANT
PEG LOCKING SMOOTH 2.2X24 (Peg) ×2 IMPLANT
PLATE MEDIUM DVR RIGHT (Plate) ×2 IMPLANT
SCREW LOCK 12X2.7X 3 LD (Screw) IMPLANT
SCREW LOCK 26X2.7X 3 LD TPR (Screw) IMPLANT
SCREW LOCKING 2.7X12MM (Screw) ×6 IMPLANT
SCREW LOCKING 2.7X13MM (Screw) ×6 IMPLANT
SCREW LOCKING 2.7X26MM (Screw) ×3 IMPLANT
SCREW LOCKING 2.7X8MM (Screw) ×2 IMPLANT
SCREW NLOCK 26X2.7X3 LD THRD (Screw) IMPLANT
SCREW NONLOCK 2.7X26MM (Screw) ×3 IMPLANT
SOAP 2 % CHG 4 OZ (WOUND CARE) ×3 IMPLANT
SPLINT FIBERGLASS 3X35 (CAST SUPPLIES) ×2 IMPLANT
SPONGE GAUZE 4X4 12PLY (GAUZE/BANDAGES/DRESSINGS) ×3 IMPLANT
SPONGE LAP 4X18 X RAY DECT (DISPOSABLE) ×3 IMPLANT
STRIP CLOSURE SKIN 1/2X4 (GAUZE/BANDAGES/DRESSINGS) IMPLANT
SUT ETHILON 4 0 PS 2 18 (SUTURE) IMPLANT
SUT MNCRL AB 4-0 PS2 18 (SUTURE) ×2 IMPLANT
SUT PROLENE 4 0 PS 2 18 (SUTURE) ×4 IMPLANT
SUT VIC AB 2-0 FS1 27 (SUTURE) IMPLANT
SUT VICRYL 4-0 PS2 18IN ABS (SUTURE) IMPLANT
SYR CONTROL 10ML LL (SYRINGE) ×2 IMPLANT
SYSTEM CHEST DRAIN TLS 7FR (DRAIN) IMPLANT
TOWEL OR 17X24 6PK STRL BLUE (TOWEL DISPOSABLE) ×3 IMPLANT
TOWEL OR 17X26 10 PK STRL BLUE (TOWEL DISPOSABLE) ×3 IMPLANT
TUBE CONNECTING 12'X1/4 (SUCTIONS) ×1
TUBE CONNECTING 12X1/4 (SUCTIONS) ×2 IMPLANT
WATER STERILE IRR 1000ML POUR (IV SOLUTION) ×3 IMPLANT
YANKAUER SUCT BULB TIP NO VENT (SUCTIONS) ×2 IMPLANT

## 2013-06-09 NOTE — Anesthesia Preprocedure Evaluation (Signed)
Anesthesia Evaluation  Patient identified by MRN, date of birth, ID band Patient awake    Reviewed: Allergy & Precautions, H&P , NPO status , Patient's Chart, lab work & pertinent test results  Airway Mallampati: I TM Distance: >3 FB Neck ROM: Full    Dental   Pulmonary          Cardiovascular hypertension, Pt. on medications     Neuro/Psych    GI/Hepatic GERD-  Medicated and Controlled,  Endo/Other    Renal/GU      Musculoskeletal   Abdominal   Peds  Hematology   Anesthesia Other Findings   Reproductive/Obstetrics                           Anesthesia Physical Anesthesia Plan  ASA: III  Anesthesia Plan: General   Post-op Pain Management:    Induction: Intravenous  Airway Management Planned: LMA  Additional Equipment:   Intra-op Plan:   Post-operative Plan: Extubation in OR  Informed Consent: I have reviewed the patients History and Physical, chart, labs and discussed the procedure including the risks, benefits and alternatives for the proposed anesthesia with the patient or authorized representative who has indicated his/her understanding and acceptance.     Plan Discussed with: CRNA and Surgeon  Anesthesia Plan Comments:         Anesthesia Quick Evaluation

## 2013-06-09 NOTE — Brief Op Note (Signed)
06/09/2013  7:26 AM  PATIENT:  Rogue Jury Romulus  80 y.o. female  PRE-OPERATIVE DIAGNOSIS:  right radius and ulnar fracture  POST-OPERATIVE DIAGNOSIS:  * No post-op diagnosis entered *  PROCEDURE:  Procedure(s): OPEN REDUCTION INTERNAL FIXATION (ORIF) DISTAL RADIAL FRACTURE (Right)  SURGEON:  Surgeon(s) and Role:    * Linna Hoff, MD - Primary  PHYSICIAN ASSISTANT:   ASSISTANTS: none   ANESTHESIA:   general  EBL:     BLOOD ADMINISTERED:none  DRAINS: none   LOCAL MEDICATIONS USED:  MARCAINE     SPECIMEN:  No Specimen  DISPOSITION OF SPECIMEN:  N/A  COUNTS:  YES  TOURNIQUET:  * No tourniquets in log *  DICTATION: .Other Dictation: Dictation Number [id number]  PLAN OF CARE: Admit to inpatient   PATIENT DISPOSITION:  PACU - hemodynamically stable.   Delay start of Pharmacological VTE agent (>24hrs) due to surgical blood loss or risk of bleeding: not applicable

## 2013-06-09 NOTE — Anesthesia Postprocedure Evaluation (Signed)
Anesthesia Post Note  Patient: Diane Stevenson, Diane Stevenson  Procedure(s) Performed: Procedure(s) (LRB): OPEN REDUCTION INTERNAL FIXATION (ORIF) DISTAL RADIAL FRACTURE (Right)  Anesthesia type: general  Patient location: PACU  Post pain: Pain level controlled  Post assessment: Patient's Cardiovascular Status Stable  Last Vitals:  Filed Vitals:   06/09/13 2100  BP: 202/91  Pulse: 103  Temp:   Resp: 13    Post vital signs: Reviewed and stable  Level of consciousness: sedated  Complications: No apparent anesthesia complications

## 2013-06-09 NOTE — Preoperative (Signed)
Beta Blockers   Reason not to administer Beta Blockers:Not Applicable 

## 2013-06-09 NOTE — Progress Notes (Signed)
Inpatient Diabetes Program Recommendations  AACE/ADA: New Consensus Statement on Inpatient Glycemic Control (2013)  Target Ranges:  Prepandial:   less than 140 mg/dL      Peak postprandial:   less than 180 mg/dL (1-2 hours)      Critically ill patients:  140 - 180 mg/dL   Reason for Visit: Results for [name], WELCOME (MRN [id number]) as of 06/09/2013 10:50  Ref. Range 06/09/2013 00:48 06/09/2013 06:30  Glucose-Capillary Latest Range: 70-99 mg/dL 256 (H) 226 (H)   Note history of diabetes.  Please check A1C to determine pre-hospitalization control.  Please add Lantus 10 units daily while in the hospital.  Will follow.  Thanks, Diane Perl, RN, BC-ADM Inpatient Diabetes Coordinator Pager [phone]

## 2013-06-09 NOTE — Anesthesia Procedure Notes (Addendum)
Anesthesia Regional Block:  Supraclavicular block  Pre-Anesthetic Checklist: ,, timeout performed, Correct Patient, Correct Site, Correct Laterality, Correct Procedure, Correct Position, site marked, Risks and benefits discussed,  Surgical consent,  Pre-op evaluation,  At surgeon's request and post-op pain management  Laterality: Right  Prep: chloraprep       Needles:  Injection technique: Single-shot  Needle Type: Echogenic Stimulator Needle     Needle Length: 9cm  Needle Gauge: 21 and 21 G    Additional Needles:  Procedures: ultrasound guided (picture in chart) and nerve stimulator Supraclavicular block  Nerve Stimulator or Paresthesia:  Response: 0.4 mA,   Additional Responses:   Narrative:  Start time: 06/09/2013 6:20 PM End time: 06/09/2013 6:30 PM Injection made incrementally with aspirations every 5 mL.  Performed by: Personally  Anesthesiologist: Lillia Abed MD  Additional Notes: Monitors applied. Patient sedated. Sterile prep and drape,hand hygiene and sterile gloves were used. Relevant anatomy identified.Needle position confirmed.Local anesthetic injected incrementally after negative aspiration. Local anesthetic spread visualized around nerve(s). Vascular puncture avoided. No complications. Image printed for medical record.The patient tolerated the procedure well.        Procedure Name: Intubation Date/Time: 06/09/2013 6:49 PM Performed by: Eligha Bridegroom Pre-anesthesia Checklist: Patient identified, Timeout performed, Emergency Drugs available, Suction available and Patient being monitored Patient Re-evaluated:Patient Re-evaluated prior to inductionOxygen Delivery Method: Circle system utilized Preoxygenation: Pre-oxygenation with 100% oxygen Intubation Type: IV induction and Rapid sequence Ventilation: Mask ventilation without difficulty Laryngoscope Size: Mac and 3 Grade View: Grade I Tube type: Oral Tube size: 7.5 mm Number of attempts: 1 Airway  Equipment and Method: Stylet Placement Confirmation: ETT inserted through vocal cords under direct vision,  breath sounds checked- equal and bilateral and positive ETCO2 Secured at: 21 cm Tube secured with: Tape Dental Injury: Teeth and Oropharynx as per pre-operative assessment     Anesthesia Regional Block:   Narrative:

## 2013-06-09 NOTE — Transfer of Care (Signed)
Immediate Anesthesia Transfer of Care Note  Patient: Diane Stevenson - [facility]  Procedure(s) Performed: Procedure(s): OPEN REDUCTION INTERNAL FIXATION (ORIF) DISTAL RADIAL FRACTURE (Right)  Patient Location: PACU  Anesthesia Type:GA combined with regional for post-op pain  Level of Consciousness: awake  Airway & Oxygen Therapy: Patient Spontanous Breathing and Patient connected to nasal cannula oxygen  Post-op Assessment: Report given to PACU RN and Post -op Vital signs reviewed and stable  Post vital signs: Reviewed and stable  Complications: No apparent anesthesia complications

## 2013-06-09 NOTE — ED Notes (Addendum)
Ortho tech at bedside to splint pt's right wrist. Resident and EDP at bedside to assist. Pt expereincing pain request for 54mcg  More of fentyanl.

## 2013-06-10 ENCOUNTER — Encounter (HOSPITAL_COMMUNITY): Payer: Self-pay | Admitting: Orthopedic Surgery

## 2013-06-10 LAB — URINALYSIS, ROUTINE W REFLEX MICROSCOPIC
BILIRUBIN URINE: NEGATIVE
GLUCOSE, UA: 100 mg/dL — AB
HGB URINE DIPSTICK: NEGATIVE
KETONES UR: 15 mg/dL — AB
Nitrite: NEGATIVE
PROTEIN: NEGATIVE mg/dL
Specific Gravity, Urine: 1.01 (ref 1.005–1.030)
Urobilinogen, UA: 0.2 mg/dL (ref 0.0–1.0)
pH: 7.5 (ref 5.0–8.0)

## 2013-06-10 LAB — GLUCOSE, CAPILLARY
GLUCOSE-CAPILLARY: 172 mg/dL — AB (ref 70–99)
GLUCOSE-CAPILLARY: 157 mg/dL — AB (ref 70–99)
Glucose-Capillary: 144 mg/dL — ABNORMAL HIGH (ref 70–99)
Glucose-Capillary: 194 mg/dL — ABNORMAL HIGH (ref 70–99)

## 2013-06-10 LAB — URINE MICROSCOPIC-ADD ON

## 2013-06-10 MED ORDER — INSULIN ASPART 100 UNIT/ML ~~LOC~~ SOLN: 0.0000 [IU] | Freq: Every day | SUBCUTANEOUS | Status: DC

## 2013-06-10 MED ORDER — ACETAMINOPHEN 325 MG PO TABS
325.0000 mg | ORAL_TABLET | Freq: Four times a day (QID) | ORAL | Status: DC | PRN
  Administered 2013-06-10 – 2013-06-11 (×2): 650 mg via ORAL
  Filled 2013-06-10: qty 2

## 2013-06-10 MED ORDER — TRAMADOL HCL 50 MG PO TABS
50.0000 mg | ORAL_TABLET | Freq: Four times a day (QID) | ORAL | Status: DC | PRN
  Administered 2013-06-11: 100 mg via ORAL
  Filled 2013-06-10: qty 2

## 2013-06-10 MED ORDER — INSULIN ASPART 100 UNIT/ML ~~LOC~~ SOLN
0.0000 [IU] | Freq: Three times a day (TID) | SUBCUTANEOUS | Status: DC
  Administered 2013-06-11 (×3): 3 [IU] via SUBCUTANEOUS
  Administered 2013-06-12: 2 [IU] via SUBCUTANEOUS
  Administered 2013-06-12: 3 [IU] via SUBCUTANEOUS
  Administered 2013-06-12: 2 [IU] via SUBCUTANEOUS
  Administered 2013-06-13 (×2): 3 [IU] via SUBCUTANEOUS

## 2013-06-10 MED ORDER — HYDROCODONE-ACETAMINOPHEN 5-325 MG PO TABS
1.0000 | ORAL_TABLET | ORAL | Status: DC | PRN
  Administered 2013-06-12 – 2013-06-13 (×2): 1 via ORAL
  Filled 2013-06-10 (×2): qty 1

## 2013-06-10 NOTE — Op Note (Signed)
NAMEDESA, RECH.:  [id number]  MEDICAL RECORD NO.:  [id number]  LOCATION:  5N01C                        FACILITY:  [facility]  PHYSICIAN:  Melrose Nakayama, MD  DATE OF BIRTH:  06/30/1929  DATE OF PROCEDURE:  06/09/2013 DATE OF DISCHARGE:                              OPERATIVE REPORT   PREOPERATIVE DIAGNOSES: 1. Grade 1 open distal ulna fracture. 2. Right distal radial shaft fracture, Galeazzi variant. 3. Right distal radius malunion.  POSTOPERATIVE DIAGNOSES: 1. Grade 1 open distal ulna fracture. 2. Right distal radial shaft fracture, Galeazzi variant. 3. Right distal radius malunion.  ANESTHESIA:  General via endotracheal tube, supraclavicular block performed by Dr. Conrad Dilley.  SURGICAL IMPLANT:  DVR Biomet cross lock plate with combination of locking and nonlocking screws.  SURGICAL PROCEDURE: 1. Open treatment of right wrist Galeazzi-variant, requiring internal     fixation. 2. Excisional debridement of skin, subcutaneous tissue, and bone,     right distal ulna shaft fracture. 3. Closed treatment of the distal ulna shaft fracture without internal     fixation. 4. Radiographs 3 views, right wrist.  SURGICAL INDICATIONS:  Ms. Nicholls is a right-hand dominant female who sustained previous injuries and was noted to have a malunion of distal radius.  The patient fell, sustained a comminuted distal radius shaft fracture, Galeazzi variant.  The patient is admitted to the hospital and  was scheduled to undergo the above procedure.  Risks, benefits, and alternatives were discussed in detail with the patient and signed informed consent obtained.  Risks include, but not limited to bleeding, infection, damage to nearby nerves, arteries, or tendons, loss of motion of the wrist and digits, incomplete relief of symptoms, nonunion, malunion, hardware failure, and need for further surgical intervention.  DESCRIPTION OF PROCEDURE:  The patient was properly  identified in the preop holding area, mark with permanent marker made on the right forearm to indicate the correct operative site.  The patient was then brought back to the operating room, placed supine on the anesthesia table. General anesthesia was administered.  The patient tolerated this well. The patient received preoperative antibiotics prior to skin incision.  A well-padded tourniquet was placed on the right brachium sealed with 1000 drape.  The right upper extremity was then prepped and draped in sterile fashion.  Time-out was called.  Correct site was identified, and procedure then begun.  Attention then turned right forearm where the limb was then elevated using Esmarch exsanguination and tourniquet inflated.  Longitudinal incision made directly over the FCR sheath. Dissection was carried down through the skin and subcutaneous tissues. Subcutaneous veins were cauterized using bipolar cautery.  The FCR sheath was then opened proximally and distally going through the floor of the FCR sheath, pronator quadratus, then carefully FCR was then retracted and the pronator quadratus was carefully elevated. This was noted to be a Galeazzi fracture variant.  An open reduction was then performed.  After this was carried out, held in place with reduction clamp.  The appropriate size DVR cross lock plate was then chosen. Plate height was then adjusted.  This was confirmed using C-arm.  The plate was bent, flattening the plate, taking the volar curvature  out of it to correct for the malunion.  This aligned nicely along the volar surface bone.  Once this was appropriately placed distally, distal fixation was then placed with a combination of locking screws and pegs.  Further fixation was utilized with a combination of locking and nonlocking screws.  The wound was then thoroughly irrigated.  Copious wound irrigation done throughout.  There was not much of a  pronator quadratus that was able to be  closed.  Subcutaneous tissues closed with 4-0 Vicryl and skin closed with 4-0 Prolene. Adaptic dressing, sterile compressive bandage then applied.  The skin was then closed with Prolene.  Attention was then turned to the distal ulna.  The patient did have grade 1 small focal of the distal ulna. Excisional debridement of skin, subcutaneous tissue was then carried out all the way down to the bone.  The fracture site lined up nicely.  The area was then irrigated, and the skin was loosely tacked back together with Prolene suture.  Xeroform dressings were applied on the wound. Final radiographs were then obtained, which showed good apposition anatomical alignment of the fracture as well as the ulna with a malunion noted.  Sterile compressive bandage was then applied.  Sugar-tong splint was then applied.  The patient then extubated and taken to recovery room in good condition.  POSTPROCEDURE PLAN:  The patient was admitted back to the orthopedic floor.  Long term placement given the repeated falls.  Social work and case management for  home health, potential long-term care needs.  The patient will be in long-arm cast for a total of 4 weeks given the variant fracture pattern and the bone quality and then begin a therapy regimen.  Nonweightbearing in the right upper extremity.  Radiographs at each visit.  The patient tolerated the procedure.     Melrose Nakayama, MD     FWO/MEDQ  D:  06/09/2013  T:  06/10/2013  Job:  [id number]

## 2013-06-10 NOTE — ED Provider Notes (Addendum)
I saw and evaluated the patient, reviewed the resident's note and I agree with the findings and plan. Patient is an 80 year old female brought for evaluation after a fall.  She complains of severe pain to the right wrist with obvious deformity.  She also struck her head but there was no loc and she denies headache or neck pain.  On exam, vitals are stable and she is afebrile.  Heart is rrr and lungs are clear.  Neurologically, the exam is non-focal.  The right wrist has an obvious deformity with a laceration in the area of the angulation that appears to be an open fracture.  Sensation, motor, and cap refill are intact distally.  Workup reveals no traumatic injury other than the wrist.  Orthopedics was consulted and recommended reducing and splinting the fracture in the ED.  This was done and ortho will admit the patient.  EKG Interpretation    Date/Time:  Wednesday June 08 2013 23:01:06 EST Ventricular Rate:  91 PR Interval:  208 QRS Duration: 93 QT Interval:  369 QTC Calculation: 454 R Axis:   -30 Text Interpretation:  Sinus rhythm Ventricular premature complex Borderline prolonged PR interval Left axis deviation Abnormal R-wave progression, late transition Borderline repolarization abnormality Baseline wander in lead(s) V4 V6 ED PHYSICIAN INTERPRETATION AVAILABLE IN [facility] Confirmed by TEST, RECORD ([id number]) on 06/09/2013 12:03:44 PM             Veryl Speak, MD 06/10/13 1400   Addendum:  I have reviewed and agree with the resident's wrist reduction procedure note. The wrist was reduced and splinted with good anatomic results.  Veryl Speak, MD 06/17/13 [phone]

## 2013-06-10 NOTE — Progress Notes (Signed)
Pt seen/examined Pt seems confused Spoke with daughter this evening  RUE: splint intact fingers warm well perfused  PLan: continue with inpatient care Will work with cm and sw about placement and needs after surgery
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
## 2013-06-10 NOTE — Progress Notes (Signed)
Utilization review completed.  

## 2013-06-10 NOTE — Evaluation (Signed)
Physical Therapy Evaluation Patient Details Name: Diane Stevenson MRN: [id number] DOB: 1929-08-28 Today's Date: 06/10/2013 Time: 5361-4431 PT Time Calculation (min): 34 min  PT Assessment / Plan / Recommendation History of Present Illness  OPEN REDUCTION INTERNAL FIXATION (ORIF) DISTAL RADIAL FRACTURE (Right). Pt is also c/o pain in right groin with movement (x-rays are negative)  Clinical Impression  This patient presents with acute pain and decreased functional independence following the above mentioned procedure. At the time of PT eval, pt was able to transition to EOB and SPT to the recliner with +2 assist. R platform walker in room, but not used at this time. This patient is appropriate for skilled PT interventions to address functional limitations, improve safety and independence with functional mobility, and return to PLOF.     PT Assessment  Patient needs continued PT services    Follow Up Recommendations  SNF;Supervision/Assistance - 24 hour    Does the patient have the potential to tolerate intense rehabilitation      Barriers to Discharge        Equipment Recommendations  Other (comment) (TBD by next venue of care)    Recommendations for Other Services     Frequency Min 3X/week    Precautions / Restrictions Precautions Precautions: Fall Restrictions Weight Bearing Restrictions: Yes RUE Weight Bearing: Non weight bearing   Pertinent Vitals/Pain Pt reports 4/10 pain in RUE, and unrated pain in RLE.       Mobility  Bed Mobility Overal bed mobility: +2 for physical assistance;Needs Assistance Bed Mobility: Rolling;Sidelying to Sit Rolling: +2 for physical assistance;Total assist Sidelying to sit: Total assist;+2 for physical assistance General bed mobility comments: VC's for sequencing and technique. Pt participated 10% during transition to EOB.  Transfers Overall transfer level: Needs assistance Equipment used: 2 person hand held assist Transfers: Sit  to/from [facility] Sit to Stand: Max assist;+2 physical assistance Stand pivot transfers: Max assist;+2 physical assistance General transfer comment: Pt wanting to sit prematurely due to right groin pain; on second attempt of sit<>stand pt was Mod A +1 Ambulation/Gait General Gait Details: NT due to pain and safety at this time    Exercises Other Exercises Other Exercises: Educated pt and daughter on composite flexion/extension of digits, shoulder flexion/extension, and shoulder horizontal abduction/adduction so that daughter could have pt does these   PT Diagnosis: Difficulty walking;Acute pain  PT Problem List: Decreased strength;Decreased range of motion;Decreased activity tolerance;Decreased balance;Decreased mobility;Decreased knowledge of use of DME;Decreased safety awareness;Decreased knowledge of precautions;Pain PT Treatment Interventions: DME instruction;Gait training;Stair training;Functional mobility training;Therapeutic activities;Therapeutic exercise;Neuromuscular re-education;Patient/family education     PT Goals(Current goals can be found in the care plan section) Acute Rehab PT Goals Patient Stated Goal: Did not state PT Goal Formulation: With patient/family Time For Goal Achievement: 06/24/13 Potential to Achieve Goals: Good  Visit Information  Last PT Received On: 06/10/13 Assistance Needed: +1 PT/OT/SLP Co-Evaluation/Treatment: Yes Reason for Co-Treatment: For patient/therapist safety PT goals addressed during session: Mobility/safety with mobility;Balance;Proper use of DME OT goals addressed during session: ADL's and self-care;Proper use of Adaptive equipment and DME History of Present Illness: OPEN REDUCTION INTERNAL FIXATION (ORIF) DISTAL RADIAL FRACTURE (Right). Pt is also c/o pain in right groin with movement (x-rays are negative)       Prior [place] expects to be discharged to:: Skilled nursing  facility Living Arrangements: Spouse/significant other Prior Function Level of Independence: Independent Communication Communication: No difficulties Dominant Hand: Right    Cognition  Cognition Arousal/Alertness: Lethargic Behavior  During Therapy: Flat affect Overall Cognitive Status: Impaired/Different from baseline Area of Impairment: Following commands;Problem solving General Comments: Calling out for her husband Karena Addison) when he had left the room, she thought she saw his Dietitian and was calling out for him (only thing in the direction she was looking was the brown room curtain; daughter says she has had moments that she was talking about things that weren't there    Extremity/Trunk Assessment Upper Extremity Assessment Upper Extremity Assessment: Defer to OT evaluation RUE Deficits / Details: forearm fracture now s/p ORIF RUE Coordination: decreased fine motor;decreased gross motor Lower Extremity Assessment Lower Extremity Assessment: RLE deficits/detail RLE Deficits / Details: Pt complains of R groin pain with movement and transfers.   Balance Balance Overall balance assessment: Needs assistance Sitting-balance support: Feet supported;No upper extremity supported Sitting balance-Leahy Scale: Poor Standing balance support: Single extremity supported Standing balance-Leahy Scale: Poor Standing balance comment: No RUE support, mod assist to remain standing  End of Session PT - End of Session Equipment Utilized During Treatment: Gait belt Activity Tolerance: Patient limited by pain Patient left: in chair;with call bell/phone within reach;with family/visitor present Nurse Communication: Mobility status  GP     Jolyn Lent 06/10/2013, 4:08 PM  Jolyn Lent, Rafael Hernandez, DPT [phone]

## 2013-06-10 NOTE — Care Management Note (Signed)
CARE MANAGEMENT NOTE 06/10/2013  Patient:  Diane Stevenson, Diane Stevenson   Account Number:  [id number]  Date Initiated:  06/10/2013  Documentation initiated by:  Ricki Miller  Subjective/Objective Assessment:   80 yr old female s/p ORIF right distal radius fracture.     Action/Plan:   PT/OT Eval  CM will follow   Anticipated DC Date:     Anticipated DC Plan:           Choice offered to / List presented to:             Status of service:  In process, will continue to follow

## 2013-06-11 LAB — GLUCOSE, CAPILLARY
GLUCOSE-CAPILLARY: 191 mg/dL — AB (ref 70–99)
Glucose-Capillary: 162 mg/dL — ABNORMAL HIGH (ref 70–99)
Glucose-Capillary: 185 mg/dL — ABNORMAL HIGH (ref 70–99)
Glucose-Capillary: 194 mg/dL — ABNORMAL HIGH (ref 70–99)

## 2013-06-11 NOTE — Progress Notes (Signed)
Patient pleasantly confused during the entire shift.  Oriented to self and "broken arm" only.  Disoriented to place and time.  Patient fairly cooperative with care although did swat at staff during turning.  Pt c/o right groin pain with turning.  Incontinent of urine.  CMS to RUE intact, edema remains with slight improvement to right fingers.    BP elevated this am 207/92, but rechecked and was 160/85.  Patient on scheduled altace.  SBP consistently runs 170's.  Continue to monitor.

## 2013-06-11 NOTE — Plan of Care (Signed)
Problem: Phase II Progression Outcomes Goal: Progressing with IS, TCDB Outcome: Not Met (add Reason) Refused to perform incentive spirometer; does cough, deep breathe on command; c/o discomfort in right groin with turning

## 2013-06-11 NOTE — Progress Notes (Signed)
Subjective: 2 Days Post-Op Procedure(s) (LRB): OPEN REDUCTION INTERNAL FIXATION (ORIF) DISTAL RADIAL FRACTURE (Right) Patient reports pain as mild Daughter at bedside  Objective: Vital signs in last 24 hours: Temp:  [98 F (36.7 C)-98.8 F (37.1 C)] 98 F (36.7 C) (01/17 1340) Pulse Rate:  [94-108] 100 (01/17 1340) Resp:  [16-18] 18 (01/17 1340) BP: (150-207)/(74-92) 150/74 mmHg (01/17 1340) SpO2:  [95 %-97 %] 95 % (01/17 1340)  Intake/Output from previous day: 01/16 0701 - 01/17 0700 In: 410 [P.O.:360; IV Piggyback:50] Out: -  Intake/Output this shift: Total I/O In: 240 [P.O.:240] Out: -    Recent Labs  06/08/13 2200  HGB 13.1    Recent Labs  06/08/13 2200  WBC 11.5*  RBC 3.75*  HCT 37.5  PLT 216    Recent Labs  06/08/13 2200 06/09/13 0400  NA 134* 131*  K 4.3 4.8  CL 95* 93*  CO2 26 23  BUN 11 10  CREATININE 0.55 0.51  GLUCOSE 285* 269*  CALCIUM 9.0 8.6   No results found for this basename: LABPT, INR,  in the last 72 hours  Alert, follows commands Fingers swollen, limited digital mobility Splint c/d/i    Assessment/Plan: 2 Days Post-Op Procedure(s) (LRB): OPEN REDUCTION INTERNAL FIXATION (ORIF) DISTAL RADIAL FRACTURE (Right) Up with therapy Case management/social work for placement Pt is in hospital awaiting placement  Eldana Isip W 06/11/2013, 3:17 PM

## 2013-06-11 NOTE — Progress Notes (Signed)
Physical Therapy Treatment Patient Details Name: Diane Stevenson MRN: [id number] DOB: February 19, 1930 Today's Date: 06/11/2013 Time: 2542-7062 PT Time Calculation (min): 38 min  PT Assessment / Plan / Recommendation  History of Present Illness OPEN REDUCTION INTERNAL FIXATION (ORIF) DISTAL RADIAL FRACTURE (Right). Pt is also c/o pain in right groin with movement (x-rays are negative)   PT Comments   Pt progressing towards physical therapy goals. Continues to be limited by R groin pain, however today was able to walk 7 feet with platform RW and min assist (+2 for safety/chair follow). Throughout session, pt complained of nausea, and received medication from RN during session. SNF continues to be an appropriate d/c plan at this time.   Follow Up Recommendations  SNF;Supervision/Assistance - 24 hour     Does the patient have the potential to tolerate intense rehabilitation     Barriers to Discharge        Equipment Recommendations  Other (comment) (TBD by next venue of care)    Recommendations for Other Services    Frequency Min 3X/week   Progress towards PT Goals    Plan Current plan remains appropriate    Precautions / Restrictions Precautions Precautions: Fall Restrictions Weight Bearing Restrictions: Yes RUE Weight Bearing: Non weight bearing   Pertinent Vitals/Pain Pt unable to rate pain on 0-10 scale, however main complaint is R groin pain, and not RUE pain.     Mobility  Bed Mobility Overal bed mobility: +2 for physical assistance;Needs Assistance Bed Mobility: Supine to Sit Supine to sit: Max assist;+2 for physical assistance General bed mobility comments: Bed pad used for transition to EOB. Hand-over-hand assist to grab bedrails, however pt was not able to hold on to the bedrail and use for stability when cued to do so.  Transfers Overall transfer level: Needs assistance Equipment used: 2 person hand held assist;Rolling walker (2 wheeled) Transfers: Sit to/from  [facility] Sit to Stand: Mod assist Stand pivot transfers: +2 safety/equipment;Mod assist General transfer comment: SPT from recliner to [facility] - +1 max assist, SPT from [facility] to recliner with RW - +2 mod assist for support and equipment  Ambulation/Gait Ambulation/Gait assistance: Min assist;+2 safety/equipment (Chair follow) Ambulation Distance (Feet): 7 Feet Assistive device: Right platform walker Gait Pattern/deviations: Step-to pattern;Decreased stride length;Trunk flexed;Narrow base of support Gait velocity: Decreased Gait velocity interpretation: Below normal speed for age/gender General Gait Details: VC's for sequencing and safety awareness.     Exercises     PT Diagnosis:    PT Problem List:   PT Treatment Interventions:     PT Goals (current goals can now be found in the care plan section) Acute Rehab PT Goals Patient Stated Goal: Did not state PT Goal Formulation: With patient/family Time For Goal Achievement: 06/24/13 Potential to Achieve Goals: Good  Visit Information  Last PT Received On: 06/11/13 Assistance Needed: +2 History of Present Illness: OPEN REDUCTION INTERNAL FIXATION (ORIF) DISTAL RADIAL FRACTURE (Right). Pt is also c/o pain in right groin with movement (x-rays are negative)    Subjective Data  Patient Stated Goal: Did not state   Cognition  Cognition Arousal/Alertness: Lethargic Behavior During Therapy: Flat affect Overall Cognitive Status: Impaired/Different from baseline Area of Impairment: Following commands;Problem solving    Balance  Balance Overall balance assessment: Needs assistance Sitting-balance support: Feet supported;Bilateral upper extremity supported Sitting balance-Leahy Scale: Poor Standing balance support: Bilateral upper extremity supported Standing balance-Leahy Scale: Poor  End of Session PT - End of Session Equipment Utilized During Treatment: Gait  belt Activity Tolerance: Patient limited by pain Patient  left: in chair;with call bell/phone within reach;with family/visitor present Nurse Communication: Mobility status   GP     Jolyn Lent 06/11/2013, 1:33 PM  Jolyn Lent, [place], DPT [phone]

## 2013-06-12 LAB — GLUCOSE, CAPILLARY
GLUCOSE-CAPILLARY: 147 mg/dL — AB (ref 70–99)
GLUCOSE-CAPILLARY: 136 mg/dL — AB (ref 70–99)
GLUCOSE-CAPILLARY: 141 mg/dL — AB (ref 70–99)
Glucose-Capillary: 178 mg/dL — ABNORMAL HIGH (ref 70–99)

## 2013-06-12 MED ORDER — DOCUSATE SODIUM 100 MG PO CAPS: 100.0000 mg | ORAL_CAPSULE | Freq: Two times a day (BID) | ORAL | Status: AC

## 2013-06-12 MED ORDER — VITAMIN C 500 MG PO TABS: 500.0000 mg | ORAL_TABLET | Freq: Every day | ORAL | Status: AC

## 2013-06-12 MED ORDER — HYDROCODONE-ACETAMINOPHEN 5-325 MG PO TABS: 1.0000 | ORAL_TABLET | Freq: Four times a day (QID) | ORAL | Status: AC | PRN

## 2013-06-12 NOTE — Progress Notes (Signed)
Daughter Burnett Kanaris is meeting with Neurosurgeon at [facility] today between 4:30 and 5 to complete paper work. [facility] extended a bed offer and can admit patient when she is medically stable for D/C.   Blima Rich, [place] Weekend CSW [phone]

## 2013-06-12 NOTE — Discharge Summary (Signed)
Physician Discharge Summary  Patient ID: Diane Stevenson MRN: [id number] DOB/AGE: [birth year] 80 y.o.  Admit date: 06/08/2013 Discharge date: 06/13/2013  Admission Diagnoses: right radius and ulnar fracture Past Medical History  Diagnosis Date  . Breast cancer 08/04/1995    left  . Hypertension   . Type 2 diabetes mellitus   . Dyspareunia   . Diabetes mellitus   . GERD (gastroesophageal reflux disease)   . DJD (degenerative joint disease)     back, R arm & wrist, hands     Discharge Diagnoses:  Active Problems:   Right distal ulnar fracture   Distal radius fracture, right   Surgeries: Procedure(s): OPEN REDUCTION INTERNAL FIXATION (ORIF) DISTAL RADIAL FRACTURE on 06/08/2013 - 06/09/2013    Consultants:  none  Discharged Condition: Improved  Hospital Course: Diane Stevenson is an 80 y.o. female who was admitted 06/08/2013 with a chief complaint of  Chief Complaint  Patient presents with  . Fall  , and found to have a diagnosis of right radius and ulnar fracture.  They were brought to the operating room on 06/08/2013 - 06/09/2013 and underwent Procedure(s): OPEN REDUCTION INTERNAL FIXATION (ORIF) DISTAL RADIAL FRACTURE.    They were given perioperative antibiotics: Anti-infectives   Start     Dose/Rate Route Frequency Ordered Stop   06/10/13 0430  ceFAZolin (ANCEF) IVPB 1 g/50 mL premix     1 g 100 mL/hr over 30 Minutes Intravenous 3 times per day 06/09/13 2229     06/09/13 2230  ceFAZolin (ANCEF) IVPB 1 g/50 mL premix  Status:  Discontinued     1 g 100 mL/hr over 30 Minutes Intravenous NOW 06/09/13 2229 06/09/13 2358   06/09/13 0600  ceFAZolin (ANCEF) IVPB 2 g/50 mL premix  Status:  Discontinued     2 g 100 mL/hr over 30 Minutes Intravenous On call to O.R. 06/09/13 0120 06/09/13 2220   06/08/13 2345  ceFAZolin (ANCEF) IVPB 1 g/50 mL premix  Status:  Discontinued     1 g 100 mL/hr over 30 Minutes Intravenous 3 times per day 06/08/13 2332 06/09/13 2358   06/08/13 2300   ceFAZolin (ANCEF) IVPB 1 g/50 mL premix     1 g 100 mL/hr over 30 Minutes Intravenous  Once 06/08/13 2258 06/09/13 0020    .  They were given sequential compression devices, early ambulation, and Other (comment)ambulation for DVT prophylaxis.  Recent vital signs: Patient Vitals for the past 24 hrs:  BP Temp Temp src Pulse Resp SpO2  06/12/13 1417 170/76 mmHg 98.6 F (37 C) - 88 16 97 %  06/12/13 0432 151/76 mmHg 99 F (37.2 C) Oral 85 16 97 %  06/12/13 0141 - - - - - 95 %  06/11/13 2305 - - - - - 95 %  06/11/13 2300 - - - - - 87 %  06/11/13 2128 178/70 mmHg 97.9 F (36.6 C) - 94 18 94 %  .  Recent laboratory studies: No results found.  Discharge Medications:     Medication List         alendronate 70 MG tablet  Commonly known as:  FOSAMAX  Take 70 mg by mouth once a week. Take with a full glass of water on an empty stomach. Take on Wednesday     aspirin 81 MG tablet  Take 81 mg by mouth daily.     CALCIUM 600 + D PO  Take 1 tablet by mouth 2 (two) times daily.     calcium carbonate  750 MG chewable tablet  Commonly known as:  TUMS EX  Chew 1 tablet by mouth 2 (two) times daily as needed for heartburn.     cholecalciferol 1000 UNITS tablet  Commonly known as:  VITAMIN D  Take 1,000 Units by mouth at bedtime.     docusate sodium 100 MG capsule  Commonly known as:  COLACE  Take 1 capsule (100 mg total) by mouth 2 (two) times daily.     ferrous sulfate 325 (65 FE) MG tablet  Take 325 mg by mouth daily with breakfast.     HYDROcodone-acetaminophen 5-325 MG per tablet  Commonly known as:  NORCO/VICODIN  Take 1 tablet by mouth every 6 (six) hours as needed for pain.     HYDROcodone-acetaminophen 5-325 MG per tablet  Commonly known as:  NORCO  Take 1 tablet by mouth every 6 (six) hours as needed for moderate pain.     metFORMIN 1000 MG tablet  Commonly known as:  GLUCOPHAGE  Take 1,000 mg by mouth 2 (two) times daily with a meal.     ramipril 10 MG capsule   Commonly known as:  ALTACE  Take 10 mg by mouth every morning.     vitamin B-12 1000 MCG tablet  Commonly known as:  CYANOCOBALAMIN  Take 1,000 mcg by mouth daily.     vitamin C 500 MG tablet  Commonly known as:  ASCORBIC ACID  Take 1 tablet (500 mg total) by mouth daily.        Diagnostic Studies: Dg Pelvis 1-2 Views  06/08/2013   CLINICAL DATA:  Fall, right-sided pelvic pain  EXAM: PELVIS - 1-2 VIEW  COMPARISON:  Prior radiograph from 08/20/2011  FINDINGS: Bilateral total hip arthroplasties are again seen, stable in position and alignment as compared to prior studies. Hardware is intact. No periprosthetic fracture. No dislocation. SI joints are approximated. No pubic dye stasis.  No soft tissue abnormality.  IMPRESSION: Stable appearance of bilateral total hip arthroplasties without evidence of hardware complication. No acute fracture or dislocation.   Electronically Signed   By: Jeannine Boga M.D.   On: 06/08/2013 22:49   Dg Wrist Complete Right  06/08/2013   CLINICAL DATA:  Fall with wrist fracture per  EXAM: RIGHT WRIST - COMPLETE 3+ VIEW  COMPARISON:  08/19/2012  FINDINGS: There are oblique fractures through the distal radial and ulnar metadiaphyses. The fractures are displaced by 100% radially. No intra-articular extension visible. Radiocarpal articulation intact.  IMPRESSION: Displaced, extra-articular distal radial and ulnar fractures.   Electronically Signed   By: Jorje Guild M.D.   On: 06/08/2013 23:14   Ct Head Wo Contrast  06/08/2013   CLINICAL DATA:  Fall with laceration over the eye.  EXAM: CT HEAD WITHOUT CONTRAST  CT CERVICAL SPINE WITHOUT CONTRAST  TECHNIQUE: Multidetector CT imaging of the head and cervical spine was performed following the standard protocol without intravenous contrast. Multiplanar CT image reconstructions of the cervical spine were also generated.  COMPARISON:  08/19/2012  FINDINGS: CT HEAD FINDINGS  Skull and Sinuses:No significant abnormality.   Orbits: No acute abnormality.  Brain: No evidence of acute abnormality, such as acute infarction, hemorrhage, hydrocephalus, or mass lesion/mass effect. Generalized brain atrophy. Extensive chronic small vessel ischemic white matter disease, with low-attenuation confluent around the lateral ventricles. Lacune in the lower right putamen.  CT CERVICAL SPINE FINDINGS  Negative for acute fracture. No prevertebral edema. No gross cervical canal hematoma. There is mild anterolisthesis present at C3-4, C4-5, and C5-6, associated  with facet osteoarthritis, the most likely explanation. Diffuse degenerative disc and facet change, without focally advanced osseous canal or foraminal stenosis.  IMPRESSION: 1. No evidence of acute intracranial injury. 2. Senescent changes include atrophy and chronic small vessel ischemia. 3. Negative for cervical spine fracture. 4. Multilevel, mild anterolisthesis is associated with facet osteoarthritis - the most likely explanation. If there are concerning findings at C-spine clearance, the patient can be evaluated for ligamentous injury.   Electronically Signed   By: Jorje Guild M.D.   On: 06/08/2013 22:55   Ct Cervical Spine Wo Contrast  06/08/2013   CLINICAL DATA:  Fall with laceration over the eye.  EXAM: CT HEAD WITHOUT CONTRAST  CT CERVICAL SPINE WITHOUT CONTRAST  TECHNIQUE: Multidetector CT imaging of the head and cervical spine was performed following the standard protocol without intravenous contrast. Multiplanar CT image reconstructions of the cervical spine were also generated.  COMPARISON:  08/19/2012  FINDINGS: CT HEAD FINDINGS  Skull and Sinuses:No significant abnormality.  Orbits: No acute abnormality.  Brain: No evidence of acute abnormality, such as acute infarction, hemorrhage, hydrocephalus, or mass lesion/mass effect. Generalized brain atrophy. Extensive chronic small vessel ischemic white matter disease, with low-attenuation confluent around the lateral ventricles.  Lacune in the lower right putamen.  CT CERVICAL SPINE FINDINGS  Negative for acute fracture. No prevertebral edema. No gross cervical canal hematoma. There is mild anterolisthesis present at C3-4, C4-5, and C5-6, associated with facet osteoarthritis, the most likely explanation. Diffuse degenerative disc and facet change, without focally advanced osseous canal or foraminal stenosis.  IMPRESSION: 1. No evidence of acute intracranial injury. 2. Senescent changes include atrophy and chronic small vessel ischemia. 3. Negative for cervical spine fracture. 4. Multilevel, mild anterolisthesis is associated with facet osteoarthritis - the most likely explanation. If there are concerning findings at C-spine clearance, the patient can be evaluated for ligamentous injury.   Electronically Signed   By: Jorje Guild M.D.   On: 06/08/2013 22:55    They benefited maximally from their hospital stay and there were no complications.     Disposition: 01-Home or Self Care      Follow-up Information   Schedule an appointment as soon as possible for a visit with Linna Hoff, MD.   Specialty:  Orthopedic Surgery   Contact information:   [address] [place] [address] [phone]         Signed: Linna Hoff 06/12/2013, 7:32 PM

## 2013-06-12 NOTE — Progress Notes (Signed)
Clinical Social Work Department BRIEF PSYCHOSOCIAL ASSESSMENT 06/12/2013  Patient:  Diane Stevenson, Diane Stevenson     Account Number:  [id number]     Admit date:  06/08/2013  Clinical Social Worker:  Rolinda Roan  Date/Time:  06/12/2013 01:31 PM  Referred by:  Physician  Date Referred:  06/09/2013 Referred for  SNF Placement   Other Referral:   Interview type:  Family Other interview type:    PSYCHOSOCIAL DATA Living Status:  HUSBAND Admitted from facility:   Level of care:   Primary support name:  Ruthann Cancer Primary support relationship to patient:  SPOUSE Degree of support available:   Very supportive per patient's daughter. Husband lives with patient and is the primary caregiver to her.    CURRENT CONCERNS  Other Concerns:    SOCIAL WORK ASSESSMENT / PLAN Clinical Social Worker (CSW) met with patient's daughter Burnett Kanaris [phone] at bedside. Patient was sleeping when CSW arrived. Daughter reported that patient lives in [place] and would like for her to go to [facility] for rehab. CSW encouraged daughter to think about a back up facility in case [facility] does not have a bed available. Daughter verbalized her understanding.   Assessment/plan status:  Psychosocial Support/Ongoing Assessment of Needs Other assessment/ plan:   Information/referral to community resources:   CSW gave daughter SNF list.    PATIENT'S/FAMILY'S RESPONSE TO PLAN OF CARE: Daughter thanked CSW for visit. Daughter stated that this is a new experience for her family and her mother has been pretty independent with her father's help. Daughter reported that she understands that rehab is the best option for her mother.

## 2013-06-12 NOTE — Progress Notes (Addendum)
[facility] WORK PLACEMENT NOTE 06/12/2013  Patient:  Diane Stevenson, Diane Stevenson  Account Number:  [id number] [place] date:  06/08/2013  Clinical Social Worker:  Blima Rich, Latanya Presser  Date/time:  06/12/2013 01:44 PM  Clinical Social Work is seeking post-discharge placement for this patient at the following level of care:   [facility]   (*CSW will update this form in Epic as items are completed)   06/12/2013  Patient/family provided with [facility] Department of Clinical Social Work's list of facilities offering this level of care within the geographic area requested by the patient (or if unable, by the patient's family).  06/12/2013  Patient/family informed of their freedom to choose among providers that offer the needed level of care, that participate in Medicare, Medicaid or managed care program needed by the patient, have an available bed and are willing to accept the patient.  06/12/2013  Patient/family informed of [facility]' ownership interest in [facility], as well as of the fact that they are under no obligation to receive care at this facility.  PASARR submitted to EDS on 06/12/2013 PASARR number received from EDS on 06/12/2013  FL2 transmitted to all facilities in geographic area requested by pt/family on  06/12/2013 FL2 transmitted to all facilities within larger geographic area on   Patient informed that his/her managed care company has contracts with or will negotiate with  certain facilities, including the following:     Patient/family informed of bed offers received:   Patient chooses bed at [facility] Physician recommends and patient chooses bed at    Patient to be transferred to  on  06/13/2013 Patient to be transferred to facility by [facility]  The following physician request were entered in Epic:   Additional Comments:

## 2013-06-12 NOTE — Progress Notes (Signed)
   CARE MANAGEMENT NOTE 06/12/2013  Patient:  Diane Stevenson, Diane Stevenson   Account Number:  [id number]  Date Initiated:  06/10/2013  Documentation initiated by:  Ricki Miller  Subjective/Objective Assessment:   80 yr old female s/p ORIF right distal radius fracture.     Action/Plan:   PT/OT Eval  CM will follow   Anticipated DC Date:     Anticipated DC Plan:    In-house referral  Clinical Social Worker         Choice offered to / List presented to:             Status of service:  In process, will continue to follow Medicare Important Message given?   (If response is "NO", the following Medicare IM given date fields will be blank) Date Medicare IM given:   Date Additional Medicare IM given:    Discharge Disposition:  [facility]  Per UR Regulation:    If discussed at Long Length of Stay Meetings, dates discussed:    Comments:  06/12/13 11:10 CM spoke with CSW who states SW in process of placement.  No other CM needs communicated.  Mariane Masters, BSN, CM [phone].

## 2013-06-12 NOTE — Discharge Instructions (Signed)
No weight on right wrist, may weight bear through forearm KEEP BANDAGE CLEAN AND DRY CALL OFFICE FOR F/U APPT [phone] KEEP HAND ELEVATED ABOVE HEART OK TO APPLY ICE TO OPERATIVE AREA CONTACT OFFICE IF ANY WORSENING PAIN OR CONCERNS.

## 2013-06-13 LAB — GLUCOSE, CAPILLARY
Glucose-Capillary: 172 mg/dL — ABNORMAL HIGH (ref 70–99)
Glucose-Capillary: 183 mg/dL — ABNORMAL HIGH (ref 70–99)

## 2013-06-13 NOTE — Progress Notes (Signed)
Patient being discharged to [facility] today. She is being transported by non-emergent ambulance post right wrist ORIF 1/15. Husband is aware of transport and will meet her at the facility.

## 2013-06-13 NOTE — Progress Notes (Signed)
Physical Therapy Treatment Patient Details Name: Diane Stevenson MRN: [id number] DOB: Sep 02, 1929 Today's Date: 06/13/2013 Time: 6599-3570 PT Time Calculation (min): 16 min  PT Assessment / Plan / Recommendation  History of Present Illness OPEN REDUCTION INTERNAL FIXATION (ORIF) DISTAL RADIAL FRACTURE (Right). Pt is also c/o pain in right groin with movement (x-rays are negative)   PT Comments   Pt progressing towards physical therapy goals. Was able to improve ambulation distance with PFRW, and made good corrective changes with VC's. Pt very fatigued after gait training and was not able to tolerate much activity after seated. Pt and husband anticipate d/c to SNF this afternoon.  Follow Up Recommendations  SNF;Supervision/Assistance - 24 hour     Does the patient have the potential to tolerate intense rehabilitation     Barriers to Discharge        Equipment Recommendations  Other (comment) (TBD by next venue of care)    Recommendations for Other Services    Frequency Min 3X/week   Progress towards PT Goals Progress towards PT goals: Progressing toward goals  Plan Current plan remains appropriate    Precautions / Restrictions Precautions Precautions: Fall Restrictions Weight Bearing Restrictions: Yes RUE Weight Bearing: Non weight bearing   Pertinent Vitals/Pain Pt reports pain in her RUE at rest, and pain in her R groin after ambulation, however does not rate pain on 0-10 scale when asked.     Mobility  Bed Mobility General bed mobility comments: Pt received sitting up in recliner Transfers Overall transfer level: Needs assistance Equipment used: Rolling walker (2 wheeled) Transfers: Sit to/from Stand Sit to Stand: Mod assist;+2 physical assistance General transfer comment: Assist to come to full stand. VC's for hand placement on seated surface, and for RUE placement on platform prior to initiating transfers.  Ambulation/Gait Ambulation/Gait assistance: Min  assist Ambulation Distance (Feet): 30 Feet Assistive device: Right platform walker Gait Pattern/deviations: Step-to pattern;Decreased stride length;Trunk flexed Gait velocity: Decreased Gait velocity interpretation: Below normal speed for age/gender General Gait Details: VC's for sequencing and safety awareness.     Exercises     PT Diagnosis:    PT Problem List:   PT Treatment Interventions:     PT Goals (current goals can now be found in the care plan section) Acute Rehab PT Goals Patient Stated Goal: Did not state PT Goal Formulation: With patient/family Time For Goal Achievement: 06/24/13 Potential to Achieve Goals: Good  Visit Information  Last PT Received On: 06/13/13 Assistance Needed: +2 History of Present Illness: OPEN REDUCTION INTERNAL FIXATION (ORIF) DISTAL RADIAL FRACTURE (Right). Pt is also c/o pain in right groin with movement (x-rays are negative)    Subjective Data  Subjective: "I'm thirtsty." Patient Stated Goal: Did not state   Cognition  Cognition Arousal/Alertness: Lethargic Behavior During Therapy: Flat affect Overall Cognitive Status: Impaired/Different from baseline Area of Impairment: Following commands;Problem solving    Balance  Balance Overall balance assessment: Needs assistance Sitting-balance support: Feet supported;No upper extremity supported Sitting balance-Leahy Scale: Fair Standing balance support: Bilateral upper extremity supported Standing balance-Leahy Scale: Poor  End of Session PT - End of Session Equipment Utilized During Treatment: Gait belt Activity Tolerance: Patient limited by fatigue Patient left: in chair;with call bell/phone within reach;with family/visitor present Nurse Communication: Mobility status   GP     Jolyn Lent 06/13/2013, 11:45 AM  Jolyn Lent, PT, DPT [phone]

## 2013-06-15 ENCOUNTER — Encounter: Payer: Self-pay | Admitting: Adult Health

## 2013-06-15 ENCOUNTER — Non-Acute Institutional Stay (SKILLED_NURSING_FACILITY): Payer: Medicare Other | Admitting: Adult Health

## 2013-06-15 DIAGNOSIS — S52501A Unspecified fracture of the lower end of right radius, initial encounter for closed fracture: Secondary | ICD-10-CM

## 2013-06-15 DIAGNOSIS — D62 Acute posthemorrhagic anemia: Secondary | ICD-10-CM

## 2013-06-15 DIAGNOSIS — E119 Type 2 diabetes mellitus without complications: Secondary | ICD-10-CM | POA: Insufficient documentation

## 2013-06-15 DIAGNOSIS — E1165 Type 2 diabetes mellitus with hyperglycemia: Secondary | ICD-10-CM

## 2013-06-15 DIAGNOSIS — K5900 Constipation, unspecified: Secondary | ICD-10-CM

## 2013-06-15 DIAGNOSIS — S52601A Unspecified fracture of lower end of right ulna, initial encounter for closed fracture: Secondary | ICD-10-CM

## 2013-06-15 DIAGNOSIS — S52609A Unspecified fracture of lower end of unspecified ulna, initial encounter for closed fracture: Secondary | ICD-10-CM

## 2013-06-15 DIAGNOSIS — I10 Essential (primary) hypertension: Secondary | ICD-10-CM

## 2013-06-15 DIAGNOSIS — M810 Age-related osteoporosis without current pathological fracture: Secondary | ICD-10-CM

## 2013-06-15 DIAGNOSIS — IMO0001 Reserved for inherently not codable concepts without codable children: Secondary | ICD-10-CM

## 2013-06-15 DIAGNOSIS — S52599A Other fractures of lower end of unspecified radius, initial encounter for closed fracture: Secondary | ICD-10-CM

## 2013-06-15 MED ORDER — POLYETHYLENE GLYCOL 3350 17 GM/SCOOP PO POWD: 17.0000 g | Freq: Every day | ORAL | Status: AC

## 2013-06-15 NOTE — Progress Notes (Signed)
Patient ID: Diane Stevenson, female   DOB: January 15, 1930, 80 y.o.   MRN: [id number]     [facility]  Allergies  Allergen Reactions  . Celebrex [Celecoxib] Hives and Swelling    Caused lips to swell.  . Sulfur Hives and Swelling    Caused lips to swell  . Ibuprofen Itching and Swelling  . Statins Other (See Comments)    cramps  . Nitrofuran Derivatives Rash     Chief Complaint  Patient presents with  . Hospitalization Follow-up    HPI:  She has been hospitalized following a fall with a right ulnar and right radius fracture; which required surgical intervention. She is here for short term rehab and will return back home. She is complaining of constipation.    Past Medical History  Diagnosis Date  . Breast cancer 08/04/1995    left  . Hypertension   . Type 2 diabetes mellitus   . Dyspareunia   . Diabetes mellitus   . GERD (gastroesophageal reflux disease)   . DJD (degenerative joint disease)     back, R arm & wrist, hands     Past Surgical History  Procedure Laterality Date  . Appendectomy    . Breast surgery  1998     Left Breast  . Breast surgery  1993    Right Breast  . Breast surgery  1997    Left Breast  . Partial hip arthroplasty  1998    Left side  . Foot arthrodesis, modified mcbride      1984  . Bone spur      1984  . Joint replacement      rt knee  . Total hip arthroplasty  08/20/2011    Procedure: TOTAL HIP ARTHROPLASTY;  Surgeon: Kerin Salen, MD;  Location: [place];  Service: Orthopedics;  Laterality: Right;  DEPUY/PINNACLE CUP,SUMMIT BASIC STEM  . Eye surgery      cataracts removed, both eyes   . Fracture surgery      /L wrist- plate- 2004  . Kyphoplasty N/A 01/27/2013    Procedure: THORACIC ELEVEN, THORACIC TWELVE KYPHOPLASTY;  Surgeon: Winfield Cunas, MD;  Location: [place] [facility];  Service: Neurosurgery;  Laterality: N/A;  T11 and T12 kyphoplasty  . Open reduction internal fixation (orif) distal radial fracture Right 06/09/2013    Procedure:  OPEN REDUCTION INTERNAL FIXATION (ORIF) DISTAL RADIAL FRACTURE;  Surgeon: Linna Hoff, MD;  Location: [place];  Service: Orthopedics;  Laterality: Right;    VITAL SIGNS BP 113/65  Pulse 72  Ht 5\' 5"  (1.651 m)  Wt 137 lb (62.143 kg)  BMI 22.80 kg/m2   Patient's Medications  New Prescriptions   No medications on file  Previous Medications   ALENDRONATE (FOSAMAX) 70 MG TABLET    Take 70 mg by mouth once a week. Take with a full glass of water on an empty stomach. Take on Wednesday   ASPIRIN 81 MG TABLET    Take 81 mg by mouth daily.   CALCIUM CARBONATE (TUMS EX) 750 MG CHEWABLE TABLET    Chew 1 tablet by mouth 2 (two) times daily as needed for heartburn.   CALCIUM CARBONATE-VITAMIN D (CALCIUM 600 + D PO)    Take 1 tablet by mouth 2 (two) times daily.   CHOLECALCIFEROL (VITAMIN D) 1000 UNITS TABLET    Take 1,000 Units by mouth at bedtime.    DOCUSATE SODIUM (COLACE) 100 MG CAPSULE    Take 1 capsule (100 mg total) by mouth  2 (two) times daily.   FERROUS SULFATE 325 (65 FE) MG TABLET    Take 325 mg by mouth daily with breakfast.   HYDROCODONE-ACETAMINOPHEN (NORCO) 5-325 MG PER TABLET    Take 1 tablet by mouth every 6 (six) hours as needed for moderate pain.   LISINOPRIL (PRINIVIL,ZESTRIL) 10 MG TABLET    Take 10 mg by mouth daily.   METFORMIN (GLUCOPHAGE) 1000 MG TABLET    Take 1,000 mg by mouth 2 (two) times daily with a meal.   VITAMIN B-12 (CYANOCOBALAMIN) 1000 MCG TABLET    Take 1,000 mcg by mouth daily.   VITAMIN C (ASCORBIC ACID) 500 MG TABLET    Take 1 tablet (500 mg total) by mouth daily.  Modified Medications   No medications on file  Discontinued Medications   HYDROCODONE-ACETAMINOPHEN (NORCO/VICODIN) 5-325 MG PER TABLET    Take 1 tablet by mouth every 6 (six) hours as needed for pain.   RAMIPRIL (ALTACE) 10 MG CAPSULE    Take 10 mg by mouth every morning.     SIGNIFICANT DIAGNOSTIC EXAMS  06-08-13: pelvic x-ray: Stable appearance of bilateral total hip arthroplasties without  evidence of hardware complication. No acute fracture or dislocation.   06-08-13: ct of head and cervical spine: 1. No evidence of acute intracranial injury. 2. Senescent changes include atrophy and chronic small vessel ischemia. 3. Negative for cervical spine fracture. 4. Multilevel, mild anterolisthesis is associated with facet osteoarthritis - the most likely explanation. If there are concerning findings at C-spine clearance, the patient can be evaluated for ligamentous injury.   06-08-13: right wrist x-ray: Displaced, extra-articular distal radial and ulnar fractures.     LABS REVIEWED:   06-08-13: wbc 11.5; hgb 13.1; hct 37.5 ;mcv 100.0; plt 216;glucose 285; bun 11; creat 0.55; k+4.3; na++ 134      Review of Systems  Constitutional: Negative for malaise/fatigue.  Respiratory: Negative for cough and shortness of breath.   Cardiovascular: Negative for chest pain, palpitations and leg swelling.  Gastrointestinal: Positive for constipation. Negative for heartburn and abdominal pain.  Musculoskeletal: Positive for joint pain. Negative for myalgias.       Right arm pain medication effective   Skin: Negative.   Neurological: Negative for weakness and headaches.  Psychiatric/Behavioral: Negative for depression. The patient is not nervous/anxious.     Physical Exam  Constitutional: She is oriented to person, place, and time. No distress.  frail  Neck: Neck supple. No JVD present.  Cardiovascular: Normal rate, regular rhythm and intact distal pulses.   Respiratory: Effort normal and breath sounds normal. No respiratory distress. She has no wheezes.  GI: Soft. Bowel sounds are normal. She exhibits no distension. There is no tenderness.  Musculoskeletal: She exhibits no edema.  Has right arm in cast; is able to move all other extremities; is using walker with arm rest with therapy   Neurological: She is alert and oriented to person, place, and time.  Skin: Skin is warm and dry. She is not  diaphoretic.  Has bruise right eye   Psychiatric: She has a normal mood and affect.      ASSESSMENT/ PLAN:  1. Osteoporosis: with is worse due to her right arm fracture; will continue fosamax 70 mg weekly with ca++/d twice daily and vit d daily   2. Hypertension: is stable will continue lisinopril 10 mg daily will monitor  3. Anemia: will continue iron daily   4. Diabetes: is stable will continue metformin 1 gm twice daily; will check cbg  twice daily   5. Right ulnar/radius fracture: will continue therapy as directed; will continue vicodin 5/325 mg every 6 hours as needed for pain and will continue to monitor her status.   6. Constipation is worse; will being miralax daily and will monitor  Time spent with patient 50 minutes

## 2013-06-16 ENCOUNTER — Encounter: Payer: Self-pay | Admitting: Internal Medicine

## 2013-06-16 ENCOUNTER — Non-Acute Institutional Stay (SKILLED_NURSING_FACILITY): Payer: Medicare Other | Admitting: Internal Medicine

## 2013-06-16 DIAGNOSIS — D62 Acute posthemorrhagic anemia: Secondary | ICD-10-CM

## 2013-06-16 DIAGNOSIS — IMO0001 Reserved for inherently not codable concepts without codable children: Secondary | ICD-10-CM

## 2013-06-16 DIAGNOSIS — S52599A Other fractures of lower end of unspecified radius, initial encounter for closed fracture: Secondary | ICD-10-CM

## 2013-06-16 DIAGNOSIS — K5900 Constipation, unspecified: Secondary | ICD-10-CM

## 2013-06-16 DIAGNOSIS — M810 Age-related osteoporosis without current pathological fracture: Secondary | ICD-10-CM

## 2013-06-16 DIAGNOSIS — S52501A Unspecified fracture of the lower end of right radius, initial encounter for closed fracture: Secondary | ICD-10-CM

## 2013-06-16 DIAGNOSIS — E1165 Type 2 diabetes mellitus with hyperglycemia: Secondary | ICD-10-CM

## 2013-06-16 NOTE — Progress Notes (Signed)
Patient ID: Diane Stevenson, female   DOB: August 26, 1929, 80 y.o.   MRN: [id number]    [facility]    PCP: Irven Shelling, MD  Code Status: full code  Allergies  Allergen Reactions  . Celebrex [Celecoxib] Hives and Swelling    Caused lips to swell.  . Sulfur Hives and Swelling    Caused lips to swell  . Ibuprofen Itching and Swelling  . Statins Other (See Comments)    cramps  . Nitrofuran Derivatives Rash    Chief Complaint: new admit  HPI:  80 y/o female pt is here for STR after hospital admission with right distal radius fracture and undergoing ORIF. She tolerated the procedure well. She is working with therapy team. Goal is for her to retrun home. She had an episode of vomiting this am after having her breakfast and trying to work with therapy. She has had a bowel movement yesterday. denies any abdominal pain. Appetite has been fair. No toher complaints  Review of Systems:  Constitutional: Negative for fever, chills, weight loss, malaise/fatigue and diaphoresis.  HENT: Negative for congestion, hearing loss and sore throat.   Eyes: Negative for blurred vision, double vision and discharge.  Respiratory: Negative for cough, sputum production, shortness of breath and wheezing.   Cardiovascular: Negative for chest pain, palpitations, orthopnea and leg swelling.  Gastrointestinal: Negative for heartburn, nausea,abdominal pain, diarrhea  Genitourinary: Negative for dysuria, urgency, frequency and flank pain.  Musculoskeletal: Negative for back pain, falls, joint pain and myalgias.  Skin: Negative for itching and rash.  Neurological: Positive for weakness. Negative for dizziness, tingling, focal weakness and headaches.  Psychiatric/Behavioral: Negative for depression and memory loss. The patient is not nervous/anxious.     Past Medical History  Diagnosis Date  . Breast cancer 08/04/1995    left  . Hypertension   . Type 2 diabetes mellitus   . Dyspareunia   .  Diabetes mellitus   . GERD (gastroesophageal reflux disease)   . DJD (degenerative joint disease)     back, R arm & wrist, hands    Past Surgical History  Procedure Laterality Date  . Appendectomy    . Breast surgery  1998     Left Breast  . Breast surgery  1993    Right Breast  . Breast surgery  1997    Left Breast  . Partial hip arthroplasty  1998    Left side  . Foot arthrodesis, modified mcbride      1984  . Bone spur      1984  . Joint replacement      rt knee  . Total hip arthroplasty  08/20/2011    Procedure: TOTAL HIP ARTHROPLASTY;  Surgeon: Kerin Salen, MD;  Location: [place];  Service: Orthopedics;  Laterality: Right;  DEPUY/PINNACLE CUP,SUMMIT BASIC STEM  . Eye surgery      cataracts removed, both eyes   . Fracture surgery      /L wrist- plate- 2004  . Kyphoplasty N/A 01/27/2013    Procedure: THORACIC ELEVEN, THORACIC TWELVE KYPHOPLASTY;  Surgeon: Winfield Cunas, MD;  Location: [place] [facility];  Service: Neurosurgery;  Laterality: N/A;  T11 and T12 kyphoplasty  . Open reduction internal fixation (orif) distal radial fracture Right 06/09/2013    Procedure: OPEN REDUCTION INTERNAL FIXATION (ORIF) DISTAL RADIAL FRACTURE;  Surgeon: Linna Hoff, MD;  Location: [place];  Service: Orthopedics;  Laterality: Right;   Social History:   reports that she has never smoked.  She has never used smokeless tobacco. She reports that she does not drink alcohol or use illicit drugs.  Family History  Problem Relation Age of Onset  . Heart disease Brother     Medications: Patient's Medications  New Prescriptions   No medications on file  Previous Medications   ALENDRONATE (FOSAMAX) 70 MG TABLET    Take 70 mg by mouth once a week. Take with a full glass of water on an empty stomach. Take on Wednesday   ASPIRIN 81 MG TABLET    Take 81 mg by mouth daily.   CALCIUM CARBONATE (TUMS EX) 750 MG CHEWABLE TABLET    Chew 1 tablet by mouth 2 (two) times daily as needed for heartburn.   CALCIUM  CARBONATE-VITAMIN D (CALCIUM 600 + D PO)    Take 1 tablet by mouth 2 (two) times daily.   CHOLECALCIFEROL (VITAMIN D) 1000 UNITS TABLET    Take 1,000 Units by mouth at bedtime.    DOCUSATE SODIUM (COLACE) 100 MG CAPSULE    Take 1 capsule (100 mg total) by mouth 2 (two) times daily.   FERROUS SULFATE 325 (65 FE) MG TABLET    Take 325 mg by mouth daily with breakfast.   HYDROCODONE-ACETAMINOPHEN (NORCO) 5-325 MG PER TABLET    Take 1 tablet by mouth every 6 (six) hours as needed for moderate pain.   LISINOPRIL (PRINIVIL,ZESTRIL) 10 MG TABLET    Take 10 mg by mouth daily.   METFORMIN (GLUCOPHAGE) 1000 MG TABLET    Take 1,000 mg by mouth 2 (two) times daily with a meal.   POLYETHYLENE GLYCOL POWDER (GLYCOLAX/MIRALAX) POWDER    Take 17 g by mouth daily.   VITAMIN B-12 (CYANOCOBALAMIN) 1000 MCG TABLET    Take 1,000 mcg by mouth daily.   VITAMIN C (ASCORBIC ACID) 500 MG TABLET    Take 1 tablet (500 mg total) by mouth daily.  Modified Medications   No medications on file  Discontinued Medications   No medications on file     Physical Exam: Filed Vitals:   06/16/13 1619  BP: 118/64  Pulse: 79  Temp: 97.7 F (36.5 C)  Resp: 18  SpO2: 95%   General- elderly female in no acute distress Head- atraumatic, normocephalic Eyes- PERRLA, EOMI, no pallor, no icterus, no discharge Neck- no lymphadenopathy, no thyromegaly, no jugular vein distension, no carotid bruit Cardiovascular- normal s1,s2, no murmurs/ rubs/ gallops Respiratory- bilateral clear to auscultation, no wheeze, no rhonchi, no crackles Abdomen- bowel sounds present, soft, non tender, no guarding or rigidity, no CVA tenderness Musculoskeletal- able to move all 4 extremities, right arm in cast, able to move her fingers Neurological- no focal deficit, normal reflexes Psychiatry- alert and oriented to person, place and time, normal mood and affect   Labs reviewed: Basic Metabolic Panel:  Recent Labs  01/26/13 1406 06/08/13 2200  06/09/13 0400  NA 131* 134* 131*  K 4.5 4.3 4.8  CL 94* 95* 93*  CO2 24 26 23   GLUCOSE 105* 285* 269*  BUN 14 11 10   CREATININE 0.75 0.55 0.51  CALCIUM 10.1 9.0 8.6   Liver Function Tests:  Recent Labs  12/20/12 0914  AST 21  ALT 21  ALKPHOS 117  BILITOT 0.3  PROT 6.6  ALBUMIN 3.4*   No results found for this basename: LIPASE, AMYLASE,  in the last 8760 hours No results found for this basename: AMMONIA,  in the last 8760 hours CBC:  Recent Labs  08/19/12 1249 12/06/12 2138  12/20/12 0914 01/26/13 1406 06/08/13 2200  WBC 10.1 8.6  --  7.7 9.1 11.5*  NEUTROABS 8.0* 6.5  --  5.5  --   --   HGB 12.8 12.6  < > 11.9* 13.1 13.1  HCT 36.6 36.5  < > 36.3 37.5 37.5  MCV 89.1 90.6  --  91.9 92.4 100.0  PLT 245 235  --  236 285 216  < > = values in this interval not displayed. CBG:  Recent Labs  06/12/13 2130 06/13/13 0727 06/13/13 1109  GLUCAP 141* 172* 183*    Radiological Exams: 06-08-13: pelvic x-ray: Stable appearance of bilateral total hip arthroplasties without evidence of hardware complication. No acute fracture or dislocation.   06-08-13: ct of head and cervical spine: 1. No evidence of acute intracranial injury. 2. Senescent changes include atrophy and chronic small vessel ischemia. 3. Negative for cervical spine fracture. 4. Multilevel, mild anterolisthesis is associated with facet osteoarthritis - the most likely explanation. If there are concerning findings at C-spine clearance, the patient can be evaluated for ligamentous injury.   06-08-13: right wrist x-ray: Displaced, extra-articular distal radial and ulnar fractures.   Assessment/Plan  Right radius/ ulna fractire- s/p ORIF, to work with PT and OT, fall precautions continue vicodin 5/325 mg q6h prn for pain  constipation- continue miralax for now with colace  Dm type 2- monitor cbg, continue metformin 1000 mg bid  Osteoporosis-  continue fosamax 70 mg weekly with calcium-vit d  supplement  Hypertension- continue lisinopril 10 mg daily will monitor bp reading, continue aspirin  Anemia- monitor h/h, currently stable when d.c from hospital. Continue ferrous sulfate   Family/ staff Communication: reviewed care plan with patient and nursing supervisor   Goals of care: to return home   Labs/tests ordered: bmp, cbc

## 2013-06-17 LAB — CBC AND DIFFERENTIAL
HEMATOCRIT: 34 % — AB (ref 36–46)
HEMOGLOBIN: 11.1 g/dL — AB (ref 12.0–16.0)
Platelets: 253 10*3/uL (ref 150–399)
WBC: 5 10*3/mL

## 2013-06-17 LAB — BASIC METABOLIC PANEL
BUN: 17 mg/dL (ref 4–21)
Creatinine: 0.6 mg/dL (ref 0.5–1.1)
Glucose: 113 mg/dL
Potassium: 4.2 mmol/L (ref 3.4–5.3)
SODIUM: 133 mmol/L — AB (ref 137–147)

## 2013-07-22 ENCOUNTER — Non-Acute Institutional Stay (SKILLED_NURSING_FACILITY): Payer: Medicare Other | Admitting: Adult Health

## 2013-07-22 DIAGNOSIS — M810 Age-related osteoporosis without current pathological fracture: Secondary | ICD-10-CM

## 2013-07-22 DIAGNOSIS — I10 Essential (primary) hypertension: Secondary | ICD-10-CM

## 2013-07-22 DIAGNOSIS — S52601A Unspecified fracture of lower end of right ulna, initial encounter for closed fracture: Secondary | ICD-10-CM

## 2013-07-22 DIAGNOSIS — S52501A Unspecified fracture of the lower end of right radius, initial encounter for closed fracture: Secondary | ICD-10-CM

## 2013-07-22 DIAGNOSIS — D62 Acute posthemorrhagic anemia: Secondary | ICD-10-CM

## 2013-07-22 DIAGNOSIS — S52609A Unspecified fracture of lower end of unspecified ulna, initial encounter for closed fracture: Secondary | ICD-10-CM

## 2013-07-22 DIAGNOSIS — IMO0001 Reserved for inherently not codable concepts without codable children: Secondary | ICD-10-CM

## 2013-07-22 DIAGNOSIS — S52599A Other fractures of lower end of unspecified radius, initial encounter for closed fracture: Secondary | ICD-10-CM

## 2013-07-22 DIAGNOSIS — E1165 Type 2 diabetes mellitus with hyperglycemia: Secondary | ICD-10-CM

## 2013-07-22 DIAGNOSIS — K5900 Constipation, unspecified: Secondary | ICD-10-CM

## 2013-07-24 NOTE — Progress Notes (Signed)
Clinical social worker assisted with patient discharge to skilled nursing facility, [facility].  CSW addressed all family questions and concerns. CSW copied chart and added all important documents. CSW also set up patient transportation with [facility]. Clinical Social Worker will sign off for now as social work intervention is no longer needed.   Alyssabeth Bruster, MSW, LCSWA [phone] 

## 2013-07-28 ENCOUNTER — Encounter: Payer: Self-pay | Admitting: Adult Health

## 2013-07-28 NOTE — Progress Notes (Signed)
Patient ID: Diane Stevenson, female   DOB: Mar 28, 1930, 80 y.o.   MRN: [id number]     [facility]  Allergies  Allergen Reactions  . Celebrex [Celecoxib] Hives and Swelling    Caused lips to swell.  . Sulfur Hives and Swelling    Caused lips to swell  . Ibuprofen Itching and Swelling  . Statins Other (See Comments)    cramps  . Nitrofuran Derivatives Rash     Chief Complaint  Patient presents with  . Medical Managment of Chronic Issues    HPI:  She is being seen for the management of her chronic illnesses. Overall her status is improving. She is doing well with therapy and she tells me that she is feeling better all the time. There are no concerns being voiced by the nursing staff at this time.    Past Medical History  Diagnosis Date  . Breast cancer 08/04/1995    left  . Hypertension   . Type 2 diabetes mellitus   . Dyspareunia   . Diabetes mellitus   . GERD (gastroesophageal reflux disease)   . DJD (degenerative joint disease)     back, R arm & wrist, hands     Past Surgical History  Procedure Laterality Date  . Appendectomy    . Breast surgery  1998     Left Breast  . Breast surgery  1993    Right Breast  . Breast surgery  1997    Left Breast  . Partial hip arthroplasty  1998    Left side  . Foot arthrodesis, modified mcbride      1984  . Bone spur      1984  . Joint replacement      rt knee  . Total hip arthroplasty  08/20/2011    Procedure: TOTAL HIP ARTHROPLASTY;  Surgeon: Kerin Salen, MD;  Location: [place];  Service: Orthopedics;  Laterality: Right;  DEPUY/PINNACLE CUP,SUMMIT BASIC STEM  . Eye surgery      cataracts removed, both eyes   . Fracture surgery      /L wrist- plate- 2004  . Kyphoplasty N/A 01/27/2013    Procedure: THORACIC ELEVEN, THORACIC TWELVE KYPHOPLASTY;  Surgeon: Winfield Cunas, MD;  Location: [place] [facility];  Service: Neurosurgery;  Laterality: N/A;  T11 and T12 kyphoplasty  . Open reduction internal fixation (orif) distal radial  fracture Right 06/09/2013    Procedure: OPEN REDUCTION INTERNAL FIXATION (ORIF) DISTAL RADIAL FRACTURE;  Surgeon: Linna Hoff, MD;  Location: [place];  Service: Orthopedics;  Laterality: Right;    VITAL SIGNS BP 132/70  Pulse 68  Ht 5\' 5"  (1.651 m)  Wt 137 lb 3.2 oz (62.234 kg)  BMI 22.83 kg/m2   Patient's Medications  New Prescriptions   No medications on file  Previous Medications   ALENDRONATE (FOSAMAX) 70 MG TABLET    Take 70 mg by mouth once a week. Take with a full glass of water on an empty stomach. Take on Wednesday   ASPIRIN 81 MG TABLET    Take 81 mg by mouth daily.   CALCIUM CARBONATE (TUMS EX) 750 MG CHEWABLE TABLET    Chew 1 tablet by mouth 2 (two) times daily as needed for heartburn.   CALCIUM CARBONATE-VITAMIN D (CALCIUM 600 + D PO)    Take 1 tablet by mouth 2 (two) times daily.   CHOLECALCIFEROL (VITAMIN D) 1000 UNITS TABLET    Take 1,000 Units by mouth at bedtime.    DOCUSATE  SODIUM (COLACE) 100 MG CAPSULE    Take 1 capsule (100 mg total) by mouth 2 (two) times daily.   FERROUS SULFATE 325 (65 FE) MG TABLET    Take 325 mg by mouth daily with breakfast.   HYDROCODONE-ACETAMINOPHEN (NORCO) 5-325 MG PER TABLET    Take 1 tablet by mouth every 6 (six) hours as needed for moderate pain.   LISINOPRIL (PRINIVIL,ZESTRIL) 10 MG TABLET    Take 10 mg by mouth daily.   METFORMIN (GLUCOPHAGE) 1000 MG TABLET    Take 1,000 mg by mouth 2 (two) times daily with a meal.   POLYETHYLENE GLYCOL POWDER (GLYCOLAX/MIRALAX) POWDER    Take 17 g by mouth daily.   VITAMIN B-12 (CYANOCOBALAMIN) 1000 MCG TABLET    Take 1,000 mcg by mouth daily.   VITAMIN C (ASCORBIC ACID) 500 MG TABLET    Take 1 tablet (500 mg total) by mouth daily.  Modified Medications   No medications on file  Discontinued Medications   No medications on file    SIGNIFICANT DIAGNOSTIC EXAMS   06-08-13: pelvic x-ray: Stable appearance of bilateral total hip arthroplasties without evidence of hardware complication. No acute  fracture or dislocation.   06-08-13: ct of head and cervical spine: 1. No evidence of acute intracranial injury. 2. Senescent changes include atrophy and chronic small vessel ischemia. 3. Negative for cervical spine fracture. 4. Multilevel, mild anterolisthesis is associated with facet osteoarthritis - the most likely explanation. If there are concerning findings at C-spine clearance, the patient can be evaluated for ligamentous injury.   06-08-13: right wrist x-ray: Displaced, extra-articular distal radial and ulnar fractures.     LABS REVIEWED:   06-08-13: wbc 11.5; hgb 13.1; hct 37.5 ;mcv 100.0; plt 216;glucose 285; bun 11; creat 0.55; k+4.3; na++ 134  06-17-13: wbc 5.0; hgb 11.1; hct 34.2 ;mcv 101.8; plt 253; glucose 113; bun 17; creat 0.6; k+4.2; na++133      Review of Systems  Constitutional: Negative for malaise/fatigue.  Respiratory: Negative for cough and shortness of breath.   Cardiovascular: Negative for chest pain, palpitations and leg swelling.  Gastrointestinal: negative for constipation  Negative for heartburn and abdominal pain.  Musculoskeletal: negative for joint pain . Negative for myalgias.    Skin: Negative.   Neurological: Negative for weakness and headaches.  Psychiatric/Behavioral: Negative for depression. The patient is not nervous/anxious.     Physical Exam  Constitutional: She is oriented to person, place, and time. No distress.  frail  Neck: Neck supple. No JVD present.  Cardiovascular: Normal rate, regular rhythm and intact distal pulses.   Respiratory: Effort normal and breath sounds normal. No respiratory distress. She has no wheezes.  GI: Soft. Bowel sounds are normal. She exhibits no distension. There is no tenderness.  Musculoskeletal: She exhibits no edema.  Right  Wrist has splint in place; is not using walker with therapy at this time. Is able to move all extremities.   Neurological: She is alert and oriented to person, place, and time.  Skin:  Skin is warm and dry. She is not diaphoretic.  Psychiatric: She has a normal mood and affect.      ASSESSMENT/ PLAN:  1. Osteoporosis: stable ; will continue fosamax 70 mg weekly with ca++/d twice daily and vit d daily   2. Hypertension: is stable will continue lisinopril 10 mg daily will monitor  3. Anemia: will continue iron daily   4. Diabetes: is stable will continue metformin 1 gm twice daily; will check cbg twice daily  5. Right ulnar/radius fracture: will continue therapy as directed; will continue vicodin 5/325 mg every 6 hours as needed for pain and will continue to monitor her status.   6. Constipation is stable will continue miralax daily; colace twice daily and will monitor       Ok Edwards NP [facility]  Contact [phone] Monday through Friday 8am- 5pm  After hours call [phone]

## 2013-08-04 ENCOUNTER — Non-Acute Institutional Stay (SKILLED_NURSING_FACILITY): Payer: Medicare Other | Admitting: Adult Health

## 2013-08-04 DIAGNOSIS — S52599A Other fractures of lower end of unspecified radius, initial encounter for closed fracture: Secondary | ICD-10-CM

## 2013-08-04 DIAGNOSIS — M810 Age-related osteoporosis without current pathological fracture: Secondary | ICD-10-CM

## 2013-08-04 DIAGNOSIS — S52501A Unspecified fracture of the lower end of right radius, initial encounter for closed fracture: Secondary | ICD-10-CM

## 2013-08-04 DIAGNOSIS — I10 Essential (primary) hypertension: Secondary | ICD-10-CM

## 2013-08-04 DIAGNOSIS — D62 Acute posthemorrhagic anemia: Secondary | ICD-10-CM

## 2013-08-04 DIAGNOSIS — S52609A Unspecified fracture of lower end of unspecified ulna, initial encounter for closed fracture: Secondary | ICD-10-CM

## 2013-08-04 DIAGNOSIS — S52601A Unspecified fracture of lower end of right ulna, initial encounter for closed fracture: Secondary | ICD-10-CM

## 2013-08-07 ENCOUNTER — Encounter: Payer: Self-pay | Admitting: Adult Health

## 2013-08-07 NOTE — Progress Notes (Signed)
Patient ID: Diane Stevenson, female   DOB: 1930-02-17, 80 y.o.   MRN: [id number]     [facility]  Allergies  Allergen Reactions  . Celebrex [Celecoxib] Hives and Swelling    Caused lips to swell.  . Sulfur Hives and Swelling    Caused lips to swell  . Ibuprofen Itching and Swelling  . Statins Other (See Comments)    cramps  . Nitrofuran Derivatives Rash     Chief Complaint  Patient presents with  . Discharge Note    HPI:  She is being discharged to home with home health for pt/ot/nursing/aid. She will need a right side platform walker in order for her to maintain her current level of independence with her adl's. She will need her prescriptions to be written.    Past Medical History  Diagnosis Date  . Breast cancer 08/04/1995    left  . Hypertension   . Type 2 diabetes mellitus   . Dyspareunia   . Diabetes mellitus   . GERD (gastroesophageal reflux disease)   . DJD (degenerative joint disease)     back, R arm & wrist, hands     Past Surgical History  Procedure Laterality Date  . Appendectomy    . Breast surgery  1998     Left Breast  . Breast surgery  1993    Right Breast  . Breast surgery  1997    Left Breast  . Partial hip arthroplasty  1998    Left side  . Foot arthrodesis, modified mcbride      1984  . Bone spur      1984  . Joint replacement      rt knee  . Total hip arthroplasty  08/20/2011    Procedure: TOTAL HIP ARTHROPLASTY;  Surgeon: Kerin Salen, MD;  Location: [place];  Service: Orthopedics;  Laterality: Right;  DEPUY/PINNACLE CUP,SUMMIT BASIC STEM  . Eye surgery      cataracts removed, both eyes   . Fracture surgery      /L wrist- plate- 2004  . Kyphoplasty N/A 01/27/2013    Procedure: THORACIC ELEVEN, THORACIC TWELVE KYPHOPLASTY;  Surgeon: Winfield Cunas, MD;  Location: [place] [facility];  Service: Neurosurgery;  Laterality: N/A;  T11 and T12 kyphoplasty  . Open reduction internal fixation (orif) distal radial fracture Right 06/09/2013   Procedure: OPEN REDUCTION INTERNAL FIXATION (ORIF) DISTAL RADIAL FRACTURE;  Surgeon: Linna Hoff, MD;  Location: [place];  Service: Orthopedics;  Laterality: Right;    VITAL SIGNS BP 119/70  Pulse 69  Ht 5\' 5"  (1.651 m)  Wt 133 lb (60.328 kg)  BMI 22.13 kg/m2   Patient's Medications  New Prescriptions   No medications on file  Previous Medications   ALENDRONATE (FOSAMAX) 70 MG TABLET    Take 70 mg by mouth once a week. Take with a full glass of water on an empty stomach. Take on Wednesday   ASPIRIN 81 MG TABLET    Take 81 mg by mouth daily.   CALCIUM CARBONATE (TUMS EX) 750 MG CHEWABLE TABLET    Chew 1 tablet by mouth 2 (two) times daily as needed for heartburn.   CALCIUM CARBONATE-VITAMIN D (CALCIUM 600 + D PO)    Take 1 tablet by mouth 2 (two) times daily.   CHOLECALCIFEROL (VITAMIN D) 1000 UNITS TABLET    Take 1,000 Units by mouth at bedtime.    DOCUSATE SODIUM (COLACE) 100 MG CAPSULE    Take 1 capsule (100 mg  total) by mouth 2 (two) times daily.   FERROUS SULFATE 325 (65 FE) MG TABLET    Take 325 mg by mouth daily with breakfast.   HYDROCODONE-ACETAMINOPHEN (NORCO) 5-325 MG PER TABLET    Take 1 tablet by mouth every 6 (six) hours as needed for moderate pain.   LISINOPRIL (PRINIVIL,ZESTRIL) 10 MG TABLET    Take 10 mg by mouth daily.   METFORMIN (GLUCOPHAGE) 1000 MG TABLET    Take 1,000 mg by mouth 2 (two) times daily with a meal.   POLYETHYLENE GLYCOL POWDER (GLYCOLAX/MIRALAX) POWDER    Take 17 g by mouth daily.   VITAMIN B-12 (CYANOCOBALAMIN) 1000 MCG TABLET    Take 1,000 mcg by mouth daily.   VITAMIN C (ASCORBIC ACID) 500 MG TABLET    Take 1 tablet (500 mg total) by mouth daily.  Modified Medications   No medications on file  Discontinued Medications   No medications on file    SIGNIFICANT DIAGNOSTIC EXAMS   06-08-13: pelvic x-ray: Stable appearance of bilateral total hip arthroplasties without evidence of hardware complication. No acute fracture or dislocation.   06-08-13:  ct of head and cervical spine: 1. No evidence of acute intracranial injury. 2. Senescent changes include atrophy and chronic small vessel ischemia. 3. Negative for cervical spine fracture. 4. Multilevel, mild anterolisthesis is associated with facet osteoarthritis - the most likely explanation. If there are concerning findings at C-spine clearance, the patient can be evaluated for ligamentous injury.   06-08-13: right wrist x-ray: Displaced, extra-articular distal radial and ulnar fractures.     LABS REVIEWED:   06-08-13: wbc 11.5; hgb 13.1; hct 37.5 ;mcv 100.0; plt 216;glucose 285; bun 11; creat 0.55; k+4.3; na++ 134  06-17-13: wbc 5.0; hgb 11.1; hct 34.2 ;mcv 101.8; plt 253; glucose 113; bun 17; creat 0.6; k+4.2; na++133      Review of Systems  Constitutional: Negative for malaise/fatigue.  Respiratory: Negative for cough and shortness of breath.   Cardiovascular: Negative for chest pain, palpitations and leg swelling.  Gastrointestinal: negative for constipation  Negative for heartburn and abdominal pain.  Musculoskeletal: negative for joint pain . Negative for myalgias.    Skin: Negative.   Neurological: Negative for weakness and headaches.  Psychiatric/Behavioral: Negative for depression. The patient is not nervous/anxious.     Physical Exam  Constitutional: She is oriented to person, place, and time. No distress.  frail  Neck: Neck supple. No JVD present.  Cardiovascular: Normal rate, regular rhythm and intact distal pulses.   Respiratory: Effort normal and breath sounds normal. No respiratory distress. She has no wheezes.  GI: Soft. Bowel sounds are normal. She exhibits no distension. There is no tenderness.  Musculoskeletal: She exhibits no edema.  Right  Wrist has splint in place; is using walker with therapy at this time. Is able to move all extremities.   Neurological: She is alert and oriented to person, place, and time.  Skin: Skin is warm and dry. She is not  diaphoretic.  Psychiatric: She has a normal mood and affect.     ASSESSMENT/ PLAN:  Will discharge her to home with home health for pt/ot/nursing/aid. She will need a right side platform walker in order to maintain her current level of independence with adl's. Her prescriptions have been written,   Time spent with patient 45 minutes.        Ok Edwards NP [facility]  Contact [phone] Monday through Friday 8am- 5pm  After hours call [phone]

## 2013-08-08 DIAGNOSIS — E119 Type 2 diabetes mellitus without complications: Secondary | ICD-10-CM

## 2013-08-08 DIAGNOSIS — R269 Unspecified abnormalities of gait and mobility: Secondary | ICD-10-CM

## 2013-08-08 DIAGNOSIS — I10 Essential (primary) hypertension: Secondary | ICD-10-CM

## 2013-08-08 DIAGNOSIS — K219 Gastro-esophageal reflux disease without esophagitis: Secondary | ICD-10-CM

## 2013-08-08 DIAGNOSIS — IMO0002 Reserved for concepts with insufficient information to code with codable children: Secondary | ICD-10-CM

## 2013-09-06 ENCOUNTER — Emergency Department (INDEPENDENT_AMBULATORY_CARE_PROVIDER_SITE_OTHER): Payer: Medicare Other

## 2013-09-06 ENCOUNTER — Encounter (HOSPITAL_COMMUNITY): Payer: Self-pay | Admitting: Emergency Medicine

## 2013-09-06 ENCOUNTER — Emergency Department (INDEPENDENT_AMBULATORY_CARE_PROVIDER_SITE_OTHER)
Admission: EM | Admit: 2013-09-06 | Discharge: 2013-09-06 | Disposition: A | Discharge status: Home / Self Care | Payer: Medicare Other | Source: Home / Self Care | Attending: Family Medicine | Admitting: Family Medicine

## 2013-09-06 DIAGNOSIS — Y92009 Unspecified place in unspecified non-institutional (private) residence as the place of occurrence of the external cause: Secondary | ICD-10-CM

## 2013-09-06 DIAGNOSIS — S42009A Fracture of unspecified part of unspecified clavicle, initial encounter for closed fracture: Secondary | ICD-10-CM

## 2013-09-06 DIAGNOSIS — W06XXXA Fall from bed, initial encounter: Secondary | ICD-10-CM

## 2013-09-06 DIAGNOSIS — S42002A Fracture of unspecified part of left clavicle, initial encounter for closed fracture: Secondary | ICD-10-CM

## 2013-09-06 MED ORDER — HYDROCODONE-ACETAMINOPHEN 5-325 MG PO TABS: 1.0000 | ORAL_TABLET | Freq: Four times a day (QID) | ORAL | Status: AC | PRN

## 2013-09-06 NOTE — ED Provider Notes (Signed)
Medical screening examination/treatment/procedure(s) were performed by resident physician or non-physician practitioner and as supervising physician I was immediately available for consultation/collaboration.   Pauline Good MD.   Billy Fischer, MD 09/06/13 1113

## 2013-09-06 NOTE — ED Provider Notes (Signed)
CSN: [id number]     Arrival date & time 09/06/13  1245 History   First MD Initiated Contact with Patient 09/06/13 [phone]     Chief Complaint  Patient presents with  . Fall    left shoulder injury   (Consider location/radiation/quality/duration/timing/severity/associated sxs/prior Treatment) HPI Comments: 80 year old female with history of osteoporosis presents complaining of left shoulder pain and bruising after a fall last night. She was getting out of bed to attempt to use a bedside commode when she slipped and fell. She did not hit her head or lose consciousness. She had immediate pain in her left shoulder. Her husband was at her side within seconds of the fall and she seemed okay, just in pain. She has not been acting strangely, has not had nausea or vomiting, and has not seemed disoriented. She has pain in the left shoulder, clavicle, and left lateral superior rib cage. She denies any chest pain or shortness of breath. She does not take any blood thinners. She has a history of fractures from ground level falls.  Patient is a 80 y.o. female presenting with fall.  Fall Pertinent negatives include no chest pain, no abdominal pain and no shortness of breath.    Past Medical History  Diagnosis Date  . Breast cancer 08/04/1995    left  . Hypertension   . Type 2 diabetes mellitus   . Dyspareunia   . Diabetes mellitus   . GERD (gastroesophageal reflux disease)   . DJD (degenerative joint disease)     back, R arm & wrist, hands    Past Surgical History  Procedure Laterality Date  . Appendectomy    . Breast surgery  1998     Left Breast  . Breast surgery  1993    Right Breast  . Breast surgery  1997    Left Breast  . Partial hip arthroplasty  1998    Left side  . Foot arthrodesis, modified mcbride      1984  . Bone spur      1984  . Joint replacement      rt knee  . Total hip arthroplasty  08/20/2011    Procedure: TOTAL HIP ARTHROPLASTY;  Surgeon: Kerin Salen, MD;  Location:  [place];  Service: Orthopedics;  Laterality: Right;  DEPUY/PINNACLE CUP,SUMMIT BASIC STEM  . Eye surgery      cataracts removed, both eyes   . Fracture surgery      /L wrist- plate- 2004  . Kyphoplasty N/A 01/27/2013    Procedure: THORACIC ELEVEN, THORACIC TWELVE KYPHOPLASTY;  Surgeon: Winfield Cunas, MD;  Location: [place] [facility];  Service: Neurosurgery;  Laterality: N/A;  T11 and T12 kyphoplasty  . Open reduction internal fixation (orif) distal radial fracture Right 06/09/2013    Procedure: OPEN REDUCTION INTERNAL FIXATION (ORIF) DISTAL RADIAL FRACTURE;  Surgeon: Linna Hoff, MD;  Location: [place];  Service: Orthopedics;  Laterality: Right;   Family History  Problem Relation Age of Onset  . Heart disease Brother    History  Substance Use Topics  . Smoking status: Never Smoker   . Smokeless tobacco: Never Used  . Alcohol Use: No   OB History   Grav Para Term Preterm Abortions TAB SAB Ect Mult Living                 Review of Systems  Constitutional: Negative for fever and chills.  Eyes: Negative for visual disturbance.  Respiratory: Negative for cough, chest tightness and shortness of breath.  Cardiovascular: Negative for chest pain, palpitations and leg swelling.  Gastrointestinal: Negative for nausea, vomiting and abdominal pain.  Endocrine: Negative for polydipsia and polyuria.  Genitourinary: Negative for dysuria, urgency and frequency.  Musculoskeletal: Positive for arthralgias, gait problem and myalgias. Negative for neck pain and neck stiffness.       See history of present illness  Skin: Positive for color change. Negative for rash.  Neurological: Negative for dizziness, weakness and light-headedness.  All other systems reviewed and are negative.   Allergies  Celebrex; Sulfur; Ibuprofen; Statins; and Nitrofuran derivatives  Home Medications   Prior to Admission medications   Medication Sig Start Date End Date Taking? Authorizing Provider  aspirin 81 MG tablet Take  81 mg by mouth daily.   Yes Historical Provider, MD  calcium carbonate (TUMS EX) 750 MG chewable tablet Chew 1 tablet by mouth 2 (two) times daily as needed for heartburn.   Yes Historical Provider, MD  Calcium Carbonate-Vitamin D (CALCIUM 600 + D PO) Take 1 tablet by mouth 2 (two) times daily.   Yes Historical Provider, MD  cholecalciferol (VITAMIN D) 1000 UNITS tablet Take 1,000 Units by mouth at bedtime.    Yes Historical Provider, MD  ferrous sulfate 325 (65 FE) MG tablet Take 325 mg by mouth daily with breakfast.   Yes Historical Provider, MD  lisinopril (PRINIVIL,ZESTRIL) 10 MG tablet Take 10 mg by mouth daily.   Yes Historical Provider, MD  metFORMIN (GLUCOPHAGE) 1000 MG tablet Take 1,000 mg by mouth 2 (two) times daily with a meal.   Yes Historical Provider, MD  alendronate (FOSAMAX) 70 MG tablet Take 70 mg by mouth once a week. Take with a full glass of water on an empty stomach. Take on Wednesday    Historical Provider, MD  docusate sodium (COLACE) 100 MG capsule Take 1 capsule (100 mg total) by mouth 2 (two) times daily. 06/12/13   Linna Hoff, MD  HYDROcodone-acetaminophen (NORCO) 5-325 MG per tablet Take 1 tablet by mouth every 6 (six) hours as needed for moderate pain. 06/12/13   Linna Hoff, MD  polyethylene glycol powder (GLYCOLAX/MIRALAX) powder Take 17 g by mouth daily. 06/15/13   Gerlene Fee, NP  vitamin B-12 (CYANOCOBALAMIN) 1000 MCG tablet Take 1,000 mcg by mouth daily.    Historical Provider, MD  vitamin C (ASCORBIC ACID) 500 MG tablet Take 1 tablet (500 mg total) by mouth daily. 06/12/13   Linna Hoff, MD   BP 125/72  Pulse 86  Temp(Src) 98.4 F (36.9 C) (Oral)  Resp 18  SpO2 98% Physical Exam  Nursing note and vitals reviewed. Constitutional: She is oriented to person, place, and time. Vital signs are normal. She appears well-developed and well-nourished. No distress.  HENT:  Head: Normocephalic and atraumatic.  Eyes: EOM are normal. Pupils are equal, round,  and reactive to light.  Neck: Normal range of motion. Neck supple.  Pulmonary/Chest: Effort normal. No respiratory distress. She exhibits tenderness (left superior lateral anterior rib cage). She exhibits no crepitus and no deformity.  Musculoskeletal:       Left shoulder: She exhibits decreased range of motion, tenderness (clavicle, scapular spine, and the soft tissues surrounding this), bony tenderness and pain. She exhibits no swelling.       Arms: Neurological: She is alert and oriented to person, place, and time. She has normal strength. No cranial nerve deficit. Coordination normal.  Skin: Skin is warm and dry. No rash noted. She is not diaphoretic.  Psychiatric: She  has a normal mood and affect. Judgment normal.    ED Course  Procedures (including critical care time) Labs Review Labs Reviewed - No data to display  Results for orders placed in visit on 07/22/13  CBC AND DIFFERENTIAL      Result Value Ref Range   Hemoglobin 11.1 (*) 12.0 - 16.0 g/dL   HCT 34 (*) 36 - 46 %   Platelets 253  150 - 399 K/L   WBC 5.0    BASIC METABOLIC PANEL      Result Value Ref Range   Glucose 113     BUN 17  4 - 21 mg/dL   Creatinine 0.6  0.5 - 1.1 mg/dL   Potassium 4.2  3.4 - 5.3 mmol/L   Sodium 133 (*) 137 - 147 mmol/L   Imaging Review Dg Ribs Unilateral W/chest Left  09/06/2013   ADDENDUM REPORT: 09/06/2013 10:07  ADDENDUM: A comminuted impacted fracture of the distal left clavicle is appreciated. Advanced degenerative changes are appreciated within the glenohumeral joint.   Electronically Signed   By: Margaree Mackintosh M.D.   On: 09/06/2013 10:07   09/06/2013   CLINICAL DATA:  FALL  EXAM: LEFT RIBS AND CHEST - 3+ VIEW  COMPARISON:  DG THORACIC SPINE 2V dated 02/24/2013  FINDINGS: No fracture or other bone lesions are seen involving the ribs. There is no evidence of pneumothorax or pleural effusion. Both lungs are clear. Heart size and mediastinal contours are within normal limits. Surgical clips  are identified within the left axilla.  IMPRESSION: Negative.  Electronically Signed: By: Margaree Mackintosh M.D. On: 09/06/2013 09:50     MDM   1. Fracture of clavicle, left, closed    Spoke with her ortho surgeons office, they are OK with those films and we do not need dedicated shoulder films.  Pt is requesting no sling and also would not tolerate figure 8.  She will use wheelchair to decrease possibility of more falls and further injury until she gets in to ortho.  norco PRN pain.  Ice 3-4 times daily    Meds ordered this encounter  Medications  . HYDROcodone-acetaminophen (NORCO) 5-325 MG per tablet    Sig: Take 1 tablet by mouth every 6 (six) hours as needed for moderate pain.    Dispense:  20 tablet    Refill:  0    Order Specific Question:  Supervising Provider    Answer:  Ihor Gully D [[id number]]       Liam Graham, PA-C 09/06/13 1055

## 2013-09-06 NOTE — Discharge Instructions (Signed)
Clavicle Fracture °A clavicle fracture is a break in the collarbone. This is a common injury, especially in children. Collarbones do not harden until around the age of 20. Most collarbone fractures are treated with a simple arm sling. In some cases a figure-of-eight splint is used to help hold the broken bones in position. Although not often needed, surgery may be required if the bone fragments are not in the correct position (displaced).  °HOME CARE INSTRUCTIONS  °· Apply ice to the injury for 15-20 minutes each hour while awake for 2 days. Put the ice in a plastic bag and place a towel between the bag of ice and your skin. °· Wear the sling or splint constantly for as long as directed by your caregiver. You may remove the sling or splint for bathing or showering. Be sure to keep your shoulder in the same place as when the sling or splint is on. Do not lift your arm. °· If a figure-of-eight splint is applied, it must be tightened by another person every day. Tighten it enough to keep the shoulders held back. Allow enough room to place the index finger between the body and strap. Loosen the splint immediately if you feel numbness or tingling in your hands. °· Only take over-the-counter or prescription medicines for pain, discomfort, or fever as directed by your caregiver. °· Avoid activities that irritate or increase the pain for 4 to 6 weeks after surgery. °· Follow all instructions for follow-up with your caregiver. This includes any referrals, physical therapy, and rehabilitation. Any delay in obtaining necessary care could result in a delay or failure of the injury to heal properly. °SEEK MEDICAL CARE IF:  °You have pain and swelling that are not relieved with medications. °SEEK IMMEDIATE MEDICAL CARE IF:  °Your arm is numb, cold, or pale, even when the splint is loose. °MAKE SURE YOU:  °· Understand these instructions. °· Will watch your condition. °· Will get help right away if you are not doing well or get  worse. °Document Released: 02/19/2005 Document Revised: 08/04/2011 Document Reviewed: 12/16/2007 °[employer]® Patient Information ©2014 [employer]. ° °

## 2013-09-06 NOTE — ED Notes (Signed)
Reports losing balance last night and falling hitting left shoulder on ? Table.   Bruising and swelling noted.  Pain with movement and touch.

## 2014-04-16 ENCOUNTER — Emergency Department (HOSPITAL_COMMUNITY): Payer: Medicare Other

## 2014-04-16 ENCOUNTER — Inpatient Hospital Stay (HOSPITAL_COMMUNITY)
Admission: EM | Admit: 2014-04-16 | Discharge: 2014-05-06 | DRG: 085 | Disposition: A | Discharge status: Hospice | Payer: Medicare Other | Attending: Pulmonary Disease | Admitting: Pulmonary Disease

## 2014-04-16 ENCOUNTER — Encounter (HOSPITAL_COMMUNITY): Payer: Self-pay | Admitting: *Deleted

## 2014-04-16 DIAGNOSIS — G40109 Localization-related (focal) (partial) symptomatic epilepsy and epileptic syndromes with simple partial seizures, not intractable, without status epilepticus: Secondary | ICD-10-CM | POA: Insufficient documentation

## 2014-04-16 DIAGNOSIS — I739 Peripheral vascular disease, unspecified: Secondary | ICD-10-CM | POA: Diagnosis present

## 2014-04-16 DIAGNOSIS — I639 Cerebral infarction, unspecified: Secondary | ICD-10-CM | POA: Diagnosis present

## 2014-04-16 DIAGNOSIS — R34 Anuria and oliguria: Secondary | ICD-10-CM | POA: Diagnosis not present

## 2014-04-16 DIAGNOSIS — Z96643 Presence of artificial hip joint, bilateral: Secondary | ICD-10-CM | POA: Diagnosis present

## 2014-04-16 DIAGNOSIS — Z7982 Long term (current) use of aspirin: Secondary | ICD-10-CM

## 2014-04-16 DIAGNOSIS — M4854XA Collapsed vertebra, not elsewhere classified, thoracic region, initial encounter for fracture: Secondary | ICD-10-CM | POA: Diagnosis present

## 2014-04-16 DIAGNOSIS — J9690 Respiratory failure, unspecified, unspecified whether with hypoxia or hypercapnia: Secondary | ICD-10-CM

## 2014-04-16 DIAGNOSIS — J9602 Acute respiratory failure with hypercapnia: Secondary | ICD-10-CM

## 2014-04-16 DIAGNOSIS — Z66 Do not resuscitate: Secondary | ICD-10-CM

## 2014-04-16 DIAGNOSIS — W19XXXA Unspecified fall, initial encounter: Secondary | ICD-10-CM

## 2014-04-16 DIAGNOSIS — Z9181 History of falling: Secondary | ICD-10-CM

## 2014-04-16 DIAGNOSIS — D649 Anemia, unspecified: Secondary | ICD-10-CM | POA: Diagnosis present

## 2014-04-16 DIAGNOSIS — M549 Dorsalgia, unspecified: Secondary | ICD-10-CM

## 2014-04-16 DIAGNOSIS — Z6825 Body mass index (BMI) 25.0-25.9, adult: Secondary | ICD-10-CM

## 2014-04-16 DIAGNOSIS — I6200 Nontraumatic subdural hemorrhage, unspecified: Secondary | ICD-10-CM

## 2014-04-16 DIAGNOSIS — J9600 Acute respiratory failure, unspecified whether with hypoxia or hypercapnia: Secondary | ICD-10-CM

## 2014-04-16 DIAGNOSIS — E872 Acidosis: Secondary | ICD-10-CM | POA: Insufficient documentation

## 2014-04-16 DIAGNOSIS — S2231XA Fracture of one rib, right side, initial encounter for closed fracture: Secondary | ICD-10-CM | POA: Diagnosis present

## 2014-04-16 DIAGNOSIS — E119 Type 2 diabetes mellitus without complications: Secondary | ICD-10-CM

## 2014-04-16 DIAGNOSIS — M546 Pain in thoracic spine: Secondary | ICD-10-CM | POA: Diagnosis present

## 2014-04-16 DIAGNOSIS — S065X0A Traumatic subdural hemorrhage without loss of consciousness, initial encounter: Principal | ICD-10-CM | POA: Diagnosis present

## 2014-04-16 DIAGNOSIS — S22010A Wedge compression fracture of first thoracic vertebra, initial encounter for closed fracture: Secondary | ICD-10-CM | POA: Diagnosis present

## 2014-04-16 DIAGNOSIS — N390 Urinary tract infection, site not specified: Secondary | ICD-10-CM | POA: Diagnosis present

## 2014-04-16 DIAGNOSIS — Z853 Personal history of malignant neoplasm of breast: Secondary | ICD-10-CM

## 2014-04-16 DIAGNOSIS — G9340 Encephalopathy, unspecified: Secondary | ICD-10-CM

## 2014-04-16 DIAGNOSIS — I6330 Cerebral infarction due to thrombosis of unspecified cerebral artery: Secondary | ICD-10-CM

## 2014-04-16 DIAGNOSIS — R569 Unspecified convulsions: Secondary | ICD-10-CM | POA: Diagnosis present

## 2014-04-16 DIAGNOSIS — Z515 Encounter for palliative care: Secondary | ICD-10-CM

## 2014-04-16 DIAGNOSIS — R64 Cachexia: Secondary | ICD-10-CM

## 2014-04-16 DIAGNOSIS — E871 Hypo-osmolality and hyponatremia: Secondary | ICD-10-CM | POA: Diagnosis present

## 2014-04-16 DIAGNOSIS — E46 Unspecified protein-calorie malnutrition: Secondary | ICD-10-CM

## 2014-04-16 DIAGNOSIS — S065XAA Traumatic subdural hemorrhage with loss of consciousness status unknown, initial encounter: Secondary | ICD-10-CM

## 2014-04-16 DIAGNOSIS — R296 Repeated falls: Secondary | ICD-10-CM

## 2014-04-16 DIAGNOSIS — S22000A Wedge compression fracture of unspecified thoracic vertebra, initial encounter for closed fracture: Secondary | ICD-10-CM

## 2014-04-16 DIAGNOSIS — I10 Essential (primary) hypertension: Secondary | ICD-10-CM

## 2014-04-16 DIAGNOSIS — Z22330 Carrier of Group B streptococcus: Secondary | ICD-10-CM

## 2014-04-16 DIAGNOSIS — R4020 Unspecified coma: Secondary | ICD-10-CM | POA: Diagnosis not present

## 2014-04-16 DIAGNOSIS — G40901 Epilepsy, unspecified, not intractable, with status epilepticus: Secondary | ICD-10-CM

## 2014-04-16 DIAGNOSIS — K219 Gastro-esophageal reflux disease without esophagitis: Secondary | ICD-10-CM | POA: Diagnosis present

## 2014-04-16 DIAGNOSIS — S22059A Unspecified fracture of T5-T6 vertebra, initial encounter for closed fracture: Secondary | ICD-10-CM

## 2014-04-16 DIAGNOSIS — W1830XA Fall on same level, unspecified, initial encounter: Secondary | ICD-10-CM | POA: Diagnosis present

## 2014-04-16 DIAGNOSIS — Z978 Presence of other specified devices: Secondary | ICD-10-CM

## 2014-04-16 DIAGNOSIS — J9601 Acute respiratory failure with hypoxia: Secondary | ICD-10-CM

## 2014-04-16 DIAGNOSIS — S065X9A Traumatic subdural hemorrhage with loss of consciousness of unspecified duration, initial encounter: Secondary | ICD-10-CM

## 2014-04-16 DIAGNOSIS — S22079A Unspecified fracture of T9-T10 vertebra, initial encounter for closed fracture: Secondary | ICD-10-CM

## 2014-04-16 DIAGNOSIS — E1165 Type 2 diabetes mellitus with hyperglycemia: Secondary | ICD-10-CM | POA: Diagnosis present

## 2014-04-16 DIAGNOSIS — Z0189 Encounter for other specified special examinations: Secondary | ICD-10-CM

## 2014-04-16 MED ORDER — HYDROCODONE-ACETAMINOPHEN 5-325 MG PO TABS
2.0000 | ORAL_TABLET | Freq: Once | ORAL | Status: AC
  Administered 2014-04-16: 2 via ORAL
  Filled 2014-04-16: qty 2

## 2014-04-16 NOTE — ED Provider Notes (Signed)
I saw and evaluated the patient, reviewed the resident's note and I agree with the findings and plan.   EKG Interpretation   Date/Time:  Sunday April 16 2014 17:01:23 EST Ventricular Rate:  103 PR Interval:  163 QRS Duration: 95 QT Interval:  335 QTC Calculation: 438 R Axis:   -26 Text Interpretation:  Sinus tachycardia Multiple ventricular premature  complexes Probable LVH with secondary repol abnrm Baseline wander in  lead(s) V5 No significant change since last tracing Confirmed by [facility]   MD, Jenny Reichmann ([id number]) on 04/16/2014 5:08:35 PM     Mildly tender cervical spine and upper back; patient lost her balance with her walker and fell backwards striking her head and upper back but did not have lightheadedness or vertigo or headache or altered mental status before after the fall with no chest pain or shortness of breath before the fall.  Babette Relic, MD 04/20/14 [phone]

## 2014-04-16 NOTE — Consult Note (Addendum)
Reason for Consult: Sub Dural Hematoma Referring Physician: Dr. Ladean Raya LOURDES KUCHARSKI is an 80 y.o. female.  HPI:  Pt is an 80 y.o.lady who apparently had a fall on a carpeted floor landing on her back and striking her head without a loss of consciousness.  At the ER she had a CT scan of her head which shows an interhemispheric subdural hematoma without shift or mass effect. The patient also has evidence of compression fractures of the thoracic spine in what appears to be T6 and T10 It is difficult to determine if these are acute though they are new from a year ago. She has had previous Kyphoplasty  At T11 and 12. The patient has a past medical history of type 2 diabetes,hypertention and osteoarthritis in addition to a hx of breast Cancer. Her med list is reviewed and contains no anticoagulants or blood thinning agents. Past Medical History  Diagnosis Date  . Breast cancer 08/04/1995    left  . Hypertension   . Type 2 diabetes mellitus   . Dyspareunia   . Diabetes mellitus   . GERD (gastroesophageal reflux disease)   . DJD (degenerative joint disease)     back, R arm & wrist, hands     Past Surgical History  Procedure Laterality Date  . Appendectomy    . Breast surgery  1998     Left Breast  . Breast surgery  1993    Right Breast  . Breast surgery  1997    Left Breast  . Partial hip arthroplasty  1998    Left side  . Foot arthrodesis, modified mcbride      1984  . Bone spur      1984  . Joint replacement      rt knee  . Total hip arthroplasty  08/20/2011    Procedure: TOTAL HIP ARTHROPLASTY;  Surgeon: Kerin Salen, MD;  Location: [facility];  Service: Orthopedics;  Laterality: Right;  DEPUY/PINNACLE CUP,SUMMIT BASIC STEM  . Eye surgery      cataracts removed, both eyes   . Fracture surgery      /L wrist- plate- 2004  . Kyphoplasty N/A 01/27/2013    Procedure: THORACIC ELEVEN, THORACIC TWELVE KYPHOPLASTY;  Surgeon: Winfield Cunas, MD;  Location: [facility];  Service:  Neurosurgery;  Laterality: N/A;  T11 and T12 kyphoplasty  . Open reduction internal fixation (orif) distal radial fracture Right 06/09/2013    Procedure: OPEN REDUCTION INTERNAL FIXATION (ORIF) DISTAL RADIAL FRACTURE;  Surgeon: Linna Hoff, MD;  Location: [place];  Service: Orthopedics;  Laterality: Right;    Family History  Problem Relation Age of Onset  . Heart disease Brother     Social History:  reports that she has never smoked. She has never used smokeless tobacco. She reports that she does not drink alcohol or use illicit drugs.  Allergies:  Allergies  Allergen Reactions  . Celebrex [Celecoxib] Hives and Swelling    Caused lips to swell.  . Sulfur Hives and Swelling    Caused lips to swell  . Ibuprofen Itching and Swelling  . Statins Other (See Comments)    cramps  . Nitrofuran Derivatives Rash    Medications: I have reviewed the patient's current medications.  No results found for this or any previous visit (from the past 48 hour(s)).  Dg Chest 1 View  04/16/2014   CLINICAL DATA:  Fall.  Pain along the spine and shoulders.  EXAM: CHEST - 1 VIEW  COMPARISON:  Multiple exams, including 04/16/2014  FINDINGS: Deformity from the patient old left distal clavicular fracture noted. Chronic 1.7 cm ossific structure along the left axillary pouch/axilla. Prominent left glenohumeral degenerative arthropathy. Chondrocalcinosis.  Atherosclerotic aortic arch. Cardiac contour within normal limits. Left axillary clips.  No pneumothorax or pleural effusion observed. Lower thoracic vertebral augmentation noted. Inferior endplate compression at T10, age indeterminate.  IMPRESSION: 1. Asymmetric degenerative left glenohumeral arthropathy is chronic and stable. Chronic deformity of the left distal clavicle related to the fracture earlier this year. 2. Atherosclerotic aortic arch. 3. Lower thoracic vertebral augmentation at T11. Inferior endplate compression fracture at T10, age indeterminate.    Electronically Signed   By: Sherryl Barters M.D.   On: 04/16/2014 19:00   Dg Thoracic Spine 2 View  04/16/2014   CLINICAL DATA:  Spine pain status post fall.  EXAM: THORACIC SPINE - 2 VIEW  COMPARISON:  02/24/2013  FINDINGS: T11 and T12 vertebroplasties. Mild inferior endplate T10 height loss. Mild T6 vertebral body height loss. T1 and portion of T2 obscured on the lateral view. No dislocation. Diffuse osteopenia. Atherosclerotic vascular calcifications.  IMPRESSION: Mild compression deformities at T6 and T11 are age indeterminate however new from 2014 and therefore may be acute.   Electronically Signed   By: Carlos Levering M.D.   On: 04/16/2014 18:56   Dg Lumbar Spine 2-3 Views  04/16/2014   CLINICAL DATA:  Fall, spine pain.  EXAM: LUMBAR SPINE - 2-3 VIEW  COMPARISON:  12/20/2012 CT  FINDINGS: Mild rightward curvature of the lumbar spine. Diffuse osteopenia. Progression of inferior endplate T10 fracture. T11 and T12 vertebroplasties. Degenerative disc disease is most pronounced at L2-3 and L5-S1. Minimal retrolisthesis of L2 on L3. Advanced atherosclerotic vascular calcifications. Sacrum is partially obscured by overlying bowel.  IMPRESSION: Mild rightward curvature and multilevel degenerative changes of the lumbar spine.  No acute osseous finding of the lumbar spine.  See separate thoracic spine report.   Electronically Signed   By: Carlos Levering M.D.   On: 04/16/2014 18:59   Dg Pelvis 1-2 Views  04/16/2014   CLINICAL DATA:  Fall backwards.  Pain along the spine.  EXAM: PELVIS - 1-2 VIEW  COMPARISON:  06/08/2013  FINDINGS: Callus formation from prior fractures of the right pubic rami and right sacrum. These fractures are thought to be remote. I do not observe a new pelvic fracture. Bilateral hip prostheses noted.  IMPRESSION: 1. Callus formation compatible with healed fractures of the right pubic rami and right sacrum. No acute fracture is apparent. Bony demineralization noted, indicating  osteoporosis.   Electronically Signed   By: Sherryl Barters M.D.   On: 04/16/2014 19:03   Ct Head Wo Contrast  04/16/2014   CLINICAL DATA:  The pt walks with a walker and she lost her balance while walking this am. She fell backwards back pain. Neck pain  EXAM: CT HEAD WITHOUT CONTRAST  CT CERVICAL SPINE WITHOUT CONTRAST  TECHNIQUE: Multidetector CT imaging of the head and cervical spine was performed following the standard protocol without intravenous contrast. Multiplanar CT image reconstructions of the cervical spine were also generated.  COMPARISON:  Head CT 06/08/2013  FINDINGS: CT HEAD FINDINGS  Acute extra-axial hemorrhage extending along the interhemispheric fissure from the anterior to posterior. This subdural hematoma measures 8 mm in thickness. The hemorrhage is along the right side of the interhemispheric fissure. The hemorrhage fans s out over the right tentorium. Small amount of blood in the anterior inferior intravenous fissure additionally.  There is a shallow 1-2 mm subdural hematoma over the right frontal lobe anteriorly (image 15).  No hydrocephalus. No intraventricular hemorrhage. No cystic or patent.  There is periventricular and subcortical white matter hypodensities. No CT evidence of acute infarction.  No evidence skull fracture. No skullbase fracture. No fluid the paranasal sinuses or mastoid air cells.  CT CERVICAL SPINE FINDINGS  No prevertebral soft tissue swelling. Normal alignment of cervical vertebral bodies. No loss of vertebral body height. Normal facet articulation. Normal craniocervical junction.  There is multiple levels of mild endplate spurring. There is loss disc height at C6-C7 not changed from prior.  There is 40% loss of anterior vertebral body height at T1 which is new from prior. No retropulsion.  No evidence epidural or paraspinal hematoma.  IMPRESSION: Head CTA  1. Acute subdural hematoma extending along the entirety of the right interhemispheric fissure and right  tentorium. 2. Shallow subdural hematoma along the right frontal lobe. 3. No intraventricular hemorrhage, hydrocephalus, or mass effect. 4. Extensive white matter microvascular disease in atrophy. Cervical spine CT  1. Acute compression fracture the T1 vertebral body with no retropulsion. Approximately 40% loss of vertebral body height anteriorly. 2. Multilevel disc osteophytic disease within the cervical spine \ Critical Value/emergent results were called by telephone at the time of interpretation on 04/16/2014 at 7:14 pm to Dr. Leata Mouse , who verbally acknowledged these results.   Electronically Signed   By: Suzy Bouchard M.D.   On: 04/16/2014 19:14   Ct Cervical Spine Wo Contrast  04/16/2014   CLINICAL DATA:  The pt walks with a walker and she lost her balance while walking this am. She fell backwards back pain. Neck pain  EXAM: CT HEAD WITHOUT CONTRAST  CT CERVICAL SPINE WITHOUT CONTRAST  TECHNIQUE: Multidetector CT imaging of the head and cervical spine was performed following the standard protocol without intravenous contrast. Multiplanar CT image reconstructions of the cervical spine were also generated.  COMPARISON:  Head CT 06/08/2013  FINDINGS: CT HEAD FINDINGS  Acute extra-axial hemorrhage extending along the interhemispheric fissure from the anterior to posterior. This subdural hematoma measures 8 mm in thickness. The hemorrhage is along the right side of the interhemispheric fissure. The hemorrhage fans s out over the right tentorium. Small amount of blood in the anterior inferior intravenous fissure additionally.  There is a shallow 1-2 mm subdural hematoma over the right frontal lobe anteriorly (image 15).  No hydrocephalus. No intraventricular hemorrhage. No cystic or patent.  There is periventricular and subcortical white matter hypodensities. No CT evidence of acute infarction.  No evidence skull fracture. No skullbase fracture. No fluid the paranasal sinuses or mastoid air cells.  CT  CERVICAL SPINE FINDINGS  No prevertebral soft tissue swelling. Normal alignment of cervical vertebral bodies. No loss of vertebral body height. Normal facet articulation. Normal craniocervical junction.  There is multiple levels of mild endplate spurring. There is loss disc height at C6-C7 not changed from prior.  There is 40% loss of anterior vertebral body height at T1 which is new from prior. No retropulsion.  No evidence epidural or paraspinal hematoma.  IMPRESSION: Head CTA  1. Acute subdural hematoma extending along the entirety of the right interhemispheric fissure and right tentorium. 2. Shallow subdural hematoma along the right frontal lobe. 3. No intraventricular hemorrhage, hydrocephalus, or mass effect. 4. Extensive white matter microvascular disease in atrophy. Cervical spine CT  1. Acute compression fracture the T1 vertebral body with no retropulsion. Approximately 40% loss of vertebral  body height anteriorly. 2. Multilevel disc osteophytic disease within the cervical spine \ Critical Value/emergent results were called by telephone at the time of interpretation on 04/16/2014 at 7:14 pm to Dr. Leata Mouse , who verbally acknowledged these results.   Electronically Signed   By: Suzy Bouchard M.D.   On: 04/16/2014 19:14    ROS Blood pressure 140/60, pulse 88, temperature 98.1 F (36.7 C), resp. rate 17, SpO2 95 %. Physical Exam  Assessment/Plan: Interhemispheric subdural hematoma after fall from standing height without loss of consciousness. Possible acute osteoporotic compression fracture of thoracic spine.   Interhemispheric subdurals are non surgical and simply observed. Bed rest this evening, repeat CT of head in am. DVT prophylaxis without heparin or Lovenox. Consider ct of T spine if back pain remains problematic. I will follow with you. Cyndel Griffey J 04/16/2014, 11:48 PM

## 2014-04-16 NOTE — H&P (Signed)
PCP: Irven Shelling, MD    Chief Complaint:  headache  HPI: Diane Stevenson is a 80 y.o. female   has a past medical history of Breast cancer (08/04/1995); Hypertension; Type 2 diabetes mellitus; Dyspareunia; Diabetes mellitus; GERD (gastroesophageal reflux disease); and DJD (degenerative joint disease).   Presented with  Patient came home from church she lost her balance and fell backwards. Hitting the back ofher head on the floor and the wall. Patient did not loose consciousness. Patietn had a mild headache. She sleped a bit and when she got up had pain in her upper back. Patietn was brought to [facility] ER and CT showed Acute subdural hematoma extending along the entirety of the right interhemispheric fissure and right tentorium. And Acute compression fracture the T1 vertebral body with no retropulsion. Also noted shallow subdural hematoma along the right frontal lobe. The case was discussed at length with Dr. Ellene Route with Neurosurgery who believes patient will not benefit from operative intervention at this point. He agreed to see her in AM but requesting Hospitalist to admit.  Husband and daughter are at bedside. They were made aware of the patient's medical condition and the plan of care.  Hospitalist was called for admission for Subdural hematoma  Review of Systems:    Pertinent positives include:  Headaches, back pain Constitutional:  No weight loss, night sweats, Fevers, chills, fatigue, weight loss  HEENT:  No  Difficulty swallowing,Tooth/dental problems,Sore throat,  No sneezing, itching, ear ache, nasal congestion, post nasal drip,  Cardio-vascular:  No chest pain, Orthopnea, PND, anasarca, dizziness, palpitations.no Bilateral lower extremity swelling  GI:  No heartburn, indigestion, abdominal pain, nausea, vomiting, diarrhea, change in bowel habits, loss of appetite, melena, blood in stool, hematemesis Resp:  no shortness of breath at rest. No dyspnea on exertion, No  excess mucus, no productive cough, No non-productive cough, No coughing up of blood.No change in color of mucus.No wheezing. Skin:  no rash or lesions. No jaundice GU:  no dysuria, change in color of urine, no urgency or frequency. No straining to urinate.  No flank pain.  Musculoskeletal:  No joint pain or no joint swelling. No decreased range of motion. No back pain.  Psych:  No change in mood or affect. No depression or anxiety. No memory loss.  Neuro: no localizing neurological complaints, no tingling, no weakness, no double vision, no gait abnormality, no slurred speech, no confusion  Otherwise ROS are negative except for above, 10 systems were reviewed  Past Medical History: Past Medical History  Diagnosis Date  . Breast cancer 08/04/1995    left  . Hypertension   . Type 2 diabetes mellitus   . Dyspareunia   . Diabetes mellitus   . GERD (gastroesophageal reflux disease)   . DJD (degenerative joint disease)     back, R arm & wrist, hands    Past Surgical History  Procedure Laterality Date  . Appendectomy    . Breast surgery  1998     Left Breast  . Breast surgery  1993    Right Breast  . Breast surgery  1997    Left Breast  . Partial hip arthroplasty  1998    Left side  . Foot arthrodesis, modified mcbride      1984  . Bone spur      1984  . Joint replacement      rt knee  . Total hip arthroplasty  08/20/2011    Procedure: TOTAL HIP ARTHROPLASTY;  Surgeon: Kathalene Frames  Mayer Camel, MD;  Location: [place];  Service: Orthopedics;  Laterality: Right;  DEPUY/PINNACLE CUP,SUMMIT BASIC STEM  . Eye surgery      cataracts removed, both eyes   . Fracture surgery      /L wrist- plate- 2004  . Kyphoplasty N/A 01/27/2013    Procedure: THORACIC ELEVEN, THORACIC TWELVE KYPHOPLASTY;  Surgeon: Winfield Cunas, MD;  Location: [place] [facility];  Service: Neurosurgery;  Laterality: N/A;  T11 and T12 kyphoplasty  . Open reduction internal fixation (orif) distal radial fracture Right 06/09/2013     Procedure: OPEN REDUCTION INTERNAL FIXATION (ORIF) DISTAL RADIAL FRACTURE;  Surgeon: Linna Hoff, MD;  Location: [place];  Service: Orthopedics;  Laterality: Right;    Medications: Prior to Admission medications   Medication Sig Start Date End Date Taking? Authorizing Provider  alendronate (FOSAMAX) 70 MG tablet Take 70 mg by mouth once a week. Take with a full glass of water on an empty stomach. Take on Wednesday   Yes Historical Provider, MD  amLODipine (NORVASC) 2.5 MG tablet Take 2.5 mg by mouth daily.   Yes Historical Provider, MD  aspirin 81 MG tablet Take 81 mg by mouth daily.   Yes Historical Provider, MD  calcium carbonate (TUMS EX) 750 MG chewable tablet Chew 1 tablet by mouth 2 (two) times daily as needed for heartburn.   Yes Historical Provider, MD  Calcium Carbonate-Vitamin D (CALCIUM 600 + D PO) Take 1 tablet by mouth 2 (two) times daily.   Yes Historical Provider, MD  docusate sodium (COLACE) 100 MG capsule Take 1 capsule (100 mg total) by mouth 2 (two) times daily. 06/12/13  Yes Linna Hoff, MD  ferrous sulfate 325 (65 FE) MG tablet Take 325 mg by mouth daily with breakfast.   Yes Historical Provider, MD  HYDROcodone-acetaminophen (NORCO) 5-325 MG per tablet Take 1 tablet by mouth every 6 (six) hours as needed for moderate pain. 09/06/13  Yes Liam Graham, PA-C  metFORMIN (GLUCOPHAGE) 1000 MG tablet Take 1,000 mg by mouth 2 (two) times daily with a meal.   Yes Historical Provider, MD  polyethylene glycol powder (GLYCOLAX/MIRALAX) powder Take 17 g by mouth daily. 06/15/13  Yes Gerlene Fee, NP  ramipril (ALTACE) 10 MG capsule Take 10 mg by mouth daily.   Yes Historical Provider, MD  vitamin B-12 (CYANOCOBALAMIN) 1000 MCG tablet Take 1,000 mcg by mouth every other day.    Yes Historical Provider, MD  cholecalciferol (VITAMIN D) 1000 UNITS tablet Take 1,000 Units by mouth at bedtime.     Historical Provider, MD  FLUZONE HIGH-DOSE 0.5 ML SUSY Inject 1 Dose as directed once.  03/02/14   Historical Provider, MD  HYDROcodone-acetaminophen (NORCO) 5-325 MG per tablet Take 1 tablet by mouth every 6 (six) hours as needed for moderate pain. Patient not taking: Reported on 04/16/2014 06/12/13   Linna Hoff, MD  vitamin C (ASCORBIC ACID) 500 MG tablet Take 1 tablet (500 mg total) by mouth daily. Patient not taking: Reported on 04/16/2014 06/12/13   Linna Hoff, MD    Allergies:   Allergies  Allergen Reactions  . Celebrex [Celecoxib] Hives and Swelling    Caused lips to swell.  . Sulfur Hives and Swelling    Caused lips to swell  . Ibuprofen Itching and Swelling  . Statins Other (See Comments)    cramps  . Nitrofuran Derivatives Rash   Social History:  Ambulatory  walker   Lives at home  With family  reports that she has never smoked. She has never used smokeless tobacco. She reports that she does not drink alcohol or use illicit drugs.    Family History: family history includes Bone cancer in her sister; CAD in her sister; Heart disease in her brother; Heart failure in her mother.    Physical Exam: Patient Vitals for the past 24 hrs:  BP Temp Pulse Resp SpO2  04/16/14 2319 140/60 mmHg - 88 17 95 %  04/16/14 2221 - 98.1 F (36.7 C) - - -  04/16/14 2204 157/58 mmHg - 91 17 93 %  04/16/14 2012 157/67 mmHg - 95 18 97 %  04/16/14 1900 157/71 mmHg - 96 15 94 %  04/16/14 1800 155/63 mmHg - 95 18 93 %  04/16/14 1730 138/67 mmHg - 97 17 93 %  04/16/14 1640 133/76 mmHg 98 F (36.7 C) 100 16 94 %    1. General:  in No Acute distress 2. Psychological: Alert and  Oriented 3. Head/ENT:   Moist  Mucous Membranes                          Head Non traumatic, neck supple                          Normal  Dentition 4. SKIN:   decreased Skin turgor,  Skin clean Dry and intact no rash 5. Heart: Regular rate and rhythm no Murmur, Rub or gallop 6. Lungs: Clear to auscultation bilaterally, no wheezes or crackles   7. Abdomen: Soft, non-tender, Non  distended 8. Lower extremities: no clubbing, cyanosis, or edema 9. Neurologically strength 5/5 in all 4 ext, CN 2-12 intact, pupils equal and reactive 10. MSK: Normal range of motion  body mass index is unknown because there is no weight on file.   Labs on Admission:   No results found for this or any previous visit (from the past 24 hour(s)).  No labs were obtained at this time, cbc, cmet, troponin and INR were ordered and currently pending.   UA not obtained, will order  Lab Results  Component Value Date   HGBA1C 7.7* 08/20/2011    CrCl cannot be calculated (Unknown ideal weight.).  BNP (last 3 results) No results for input(s): PROBNP in the last 8760 hours.  Other results:  I have pearsonaly reviewed this: ECG REPORT  Rate: 103  Rhythm: NSR ST&T Change: ST depression in inferior leads   There were no vitals filed for this visit.   Cultures: No results found for: [place], [place], [place], REPTSTATUS   Radiological Exams on Admission: Dg Chest 1 View  04/16/2014   CLINICAL DATA:  Fall.  Pain along the spine and shoulders.  EXAM: CHEST - 1 VIEW  COMPARISON:  Multiple exams, including 04/16/2014  FINDINGS: Deformity from the patient old left distal clavicular fracture noted. Chronic 1.7 cm ossific structure along the left axillary pouch/axilla. Prominent left glenohumeral degenerative arthropathy. Chondrocalcinosis.  Atherosclerotic aortic arch. Cardiac contour within normal limits. Left axillary clips.  No pneumothorax or pleural effusion observed. Lower thoracic vertebral augmentation noted. Inferior endplate compression at T10, age indeterminate.  IMPRESSION: 1. Asymmetric degenerative left glenohumeral arthropathy is chronic and stable. Chronic deformity of the left distal clavicle related to the fracture earlier this year. 2. Atherosclerotic aortic arch. 3. Lower thoracic vertebral augmentation at T11. Inferior endplate compression fracture at T10, age indeterminate.    Electronically Signed   By:  Sherryl Barters M.D.   On: 04/16/2014 19:00   Dg Thoracic Spine 2 View  04/16/2014   CLINICAL DATA:  Spine pain status post fall.  EXAM: THORACIC SPINE - 2 VIEW  COMPARISON:  02/24/2013  FINDINGS: T11 and T12 vertebroplasties. Mild inferior endplate T10 height loss. Mild T6 vertebral body height loss. T1 and portion of T2 obscured on the lateral view. No dislocation. Diffuse osteopenia. Atherosclerotic vascular calcifications.  IMPRESSION: Mild compression deformities at T6 and T11 are age indeterminate however new from 2014 and therefore may be acute.   Electronically Signed   By: Carlos Levering M.D.   On: 04/16/2014 18:56   Dg Lumbar Spine 2-3 Views  04/16/2014   CLINICAL DATA:  Fall, spine pain.  EXAM: LUMBAR SPINE - 2-3 VIEW  COMPARISON:  12/20/2012 CT  FINDINGS: Mild rightward curvature of the lumbar spine. Diffuse osteopenia. Progression of inferior endplate T10 fracture. T11 and T12 vertebroplasties. Degenerative disc disease is most pronounced at L2-3 and L5-S1. Minimal retrolisthesis of L2 on L3. Advanced atherosclerotic vascular calcifications. Sacrum is partially obscured by overlying bowel.  IMPRESSION: Mild rightward curvature and multilevel degenerative changes of the lumbar spine.  No acute osseous finding of the lumbar spine.  See separate thoracic spine report.   Electronically Signed   By: Carlos Levering M.D.   On: 04/16/2014 18:59   Dg Pelvis 1-2 Views  04/16/2014   CLINICAL DATA:  Fall backwards.  Pain along the spine.  EXAM: PELVIS - 1-2 VIEW  COMPARISON:  06/08/2013  FINDINGS: Callus formation from prior fractures of the right pubic rami and right sacrum. These fractures are thought to be remote. I do not observe a new pelvic fracture. Bilateral hip prostheses noted.  IMPRESSION: 1. Callus formation compatible with healed fractures of the right pubic rami and right sacrum. No acute fracture is apparent. Bony demineralization noted, indicating  osteoporosis.   Electronically Signed   By: Sherryl Barters M.D.   On: 04/16/2014 19:03   Ct Head Wo Contrast  04/16/2014   CLINICAL DATA:  The pt walks with a walker and she lost her balance while walking this am. She fell backwards back pain. Neck pain  EXAM: CT HEAD WITHOUT CONTRAST  CT CERVICAL SPINE WITHOUT CONTRAST  TECHNIQUE: Multidetector CT imaging of the head and cervical spine was performed following the standard protocol without intravenous contrast. Multiplanar CT image reconstructions of the cervical spine were also generated.  COMPARISON:  Head CT 06/08/2013  FINDINGS: CT HEAD FINDINGS  Acute extra-axial hemorrhage extending along the interhemispheric fissure from the anterior to posterior. This subdural hematoma measures 8 mm in thickness. The hemorrhage is along the right side of the interhemispheric fissure. The hemorrhage fans s out over the right tentorium. Small amount of blood in the anterior inferior intravenous fissure additionally.  There is a shallow 1-2 mm subdural hematoma over the right frontal lobe anteriorly (image 15).  No hydrocephalus. No intraventricular hemorrhage. No cystic or patent.  There is periventricular and subcortical white matter hypodensities. No CT evidence of acute infarction.  No evidence skull fracture. No skullbase fracture. No fluid the paranasal sinuses or mastoid air cells.  CT CERVICAL SPINE FINDINGS  No prevertebral soft tissue swelling. Normal alignment of cervical vertebral bodies. No loss of vertebral body height. Normal facet articulation. Normal craniocervical junction.  There is multiple levels of mild endplate spurring. There is loss disc height at C6-C7 not changed from prior.  There is 40% loss of anterior vertebral body height  at T1 which is new from prior. No retropulsion.  No evidence epidural or paraspinal hematoma.  IMPRESSION: Head CTA  1. Acute subdural hematoma extending along the entirety of the right interhemispheric fissure and right  tentorium. 2. Shallow subdural hematoma along the right frontal lobe. 3. No intraventricular hemorrhage, hydrocephalus, or mass effect. 4. Extensive white matter microvascular disease in atrophy. Cervical spine CT  1. Acute compression fracture the T1 vertebral body with no retropulsion. Approximately 40% loss of vertebral body height anteriorly. 2. Multilevel disc osteophytic disease within the cervical spine \ Critical Value/emergent results were called by telephone at the time of interpretation on 04/16/2014 at 7:14 pm to Dr. Leata Mouse , who verbally acknowledged these results.   Electronically Signed   By: Suzy Bouchard M.D.   On: 04/16/2014 19:14   Ct Cervical Spine Wo Contrast  04/16/2014   CLINICAL DATA:  The pt walks with a walker and she lost her balance while walking this am. She fell backwards back pain. Neck pain  EXAM: CT HEAD WITHOUT CONTRAST  CT CERVICAL SPINE WITHOUT CONTRAST  TECHNIQUE: Multidetector CT imaging of the head and cervical spine was performed following the standard protocol without intravenous contrast. Multiplanar CT image reconstructions of the cervical spine were also generated.  COMPARISON:  Head CT 06/08/2013  FINDINGS: CT HEAD FINDINGS  Acute extra-axial hemorrhage extending along the interhemispheric fissure from the anterior to posterior. This subdural hematoma measures 8 mm in thickness. The hemorrhage is along the right side of the interhemispheric fissure. The hemorrhage fans s out over the right tentorium. Small amount of blood in the anterior inferior intravenous fissure additionally.  There is a shallow 1-2 mm subdural hematoma over the right frontal lobe anteriorly (image 15).  No hydrocephalus. No intraventricular hemorrhage. No cystic or patent.  There is periventricular and subcortical white matter hypodensities. No CT evidence of acute infarction.  No evidence skull fracture. No skullbase fracture. No fluid the paranasal sinuses or mastoid air cells.  CT  CERVICAL SPINE FINDINGS  No prevertebral soft tissue swelling. Normal alignment of cervical vertebral bodies. No loss of vertebral body height. Normal facet articulation. Normal craniocervical junction.  There is multiple levels of mild endplate spurring. There is loss disc height at C6-C7 not changed from prior.  There is 40% loss of anterior vertebral body height at T1 which is new from prior. No retropulsion.  No evidence epidural or paraspinal hematoma.  IMPRESSION: Head CTA  1. Acute subdural hematoma extending along the entirety of the right interhemispheric fissure and right tentorium. 2. Shallow subdural hematoma along the right frontal lobe. 3. No intraventricular hemorrhage, hydrocephalus, or mass effect. 4. Extensive white matter microvascular disease in atrophy. Cervical spine CT  1. Acute compression fracture the T1 vertebral body with no retropulsion. Approximately 40% loss of vertebral body height anteriorly. 2. Multilevel disc osteophytic disease within the cervical spine \ Critical Value/emergent results were called by telephone at the time of interpretation on 04/16/2014 at 7:14 pm to Dr. Leata Mouse , who verbally acknowledged these results.   Electronically Signed   By: Suzy Bouchard M.D.   On: 04/16/2014 19:14    Chart has been reviewed  Assessment/Plan 80 yo F with hx of DM and HTN here after a mechanical fall resulting in subdural hematoma with interhemispheric fissure tracking. Neurosurgery has been consulted requesting hospitalist admission   Present on Admission:  . Essential hypertension, benign - continue home meds . Subdural hematoma - Repeat CT in AM, admit  to stepdown, hold ASA, neuro checks q 2 hours, check INR DM2 - SSI, holding metformin T1 fracture - pain management, currently seems to be controlled with Norco, appreciate further NeuroSurgery recommendations.  Given abnormal ecg will cycle cardiac enzymes, patent is chest pain free. At this point.   Prophylaxis:  SCD, Protonix  CODE STATUS:  FULL CODE   Other plan as per orders.  I have spent a total of 75 min on this admission extra time was taken to discuss extensively with Neurosurgery Dr. Ellene Route who feels that patient not going to benefit from Neurosurgical intervention and recommends conservative management.    [place] 04/16/2014, 11:51 PM  [facility]  Pager [phone]   after 2 AM please page floor coverage PA If 7AM-7PM, please contact the day team taking care of the patient  [email or web address]  Password [email or web address]

## 2014-04-16 NOTE — ED Notes (Signed)
Family alarmed that no one has seen yet  hospitalist just walked in to see the pt

## 2014-04-16 NOTE — ED Notes (Signed)
No complaints if a headachesince she arrived.  She had a headache earlier when she fell

## 2014-04-16 NOTE — ED Notes (Signed)
Pt and family waiting for admitting doctor to see

## 2014-04-16 NOTE — ED Notes (Signed)
Patient transported to CT 

## 2014-04-16 NOTE — ED Notes (Signed)
pupilss 2.0 rt and lt equal and react to light

## 2014-04-16 NOTE — ED Notes (Signed)
The pt reports that she is more comfortable

## 2014-04-16 NOTE — ED Provider Notes (Signed)
CSN: [id number]     Arrival date & time 04/16/14  1635 History   First MD Initiated Contact with Patient 04/16/14 1654     Chief Complaint  Patient presents with  . Back Pain     (Consider location/radiation/quality/duration/timing/severity/associated sxs/prior Treatment) Patient is a 80 y.o. female presenting with back pain.  Back Pain Associated symptoms: no chest pain and no headaches    patient is an 80 year old female who presents following a mechanical fall. Patient was walking with her walker when she lost her balance and fell backwards, landing on a carpeted floor. She later primarily on her upper back but did hit her head. There was no loss of consciousness. She this occurred at approximately 2 PM. She now complains of pain in her upper thoracic back along the midline. Says it hurts more to take a deep breath, but she denies any pain radiating to her arms, and has not had no weakness in her arms. She also complains complains of pain in her coccyx.  She has had no confusion or altered mental status prior to or following.  Past Medical History  Diagnosis Date  . Breast cancer 08/04/1995    left  . Hypertension   . Type 2 diabetes mellitus   . Dyspareunia   . Diabetes mellitus   . GERD (gastroesophageal reflux disease)   . DJD (degenerative joint disease)     back, R arm & wrist, hands    Past Surgical History  Procedure Laterality Date  . Appendectomy    . Breast surgery  1998     Left Breast  . Breast surgery  1993    Right Breast  . Breast surgery  1997    Left Breast  . Partial hip arthroplasty  1998    Left side  . Foot arthrodesis, modified mcbride      1984  . Bone spur      1984  . Joint replacement      rt knee  . Total hip arthroplasty  08/20/2011    Procedure: TOTAL HIP ARTHROPLASTY;  Surgeon: Kerin Salen, MD;  Location: [facility];  Service: Orthopedics;  Laterality: Right;  DEPUY/PINNACLE CUP,SUMMIT BASIC STEM  . Eye surgery      cataracts removed,  both eyes   . Fracture surgery      /L wrist- plate- 2004  . Kyphoplasty N/A 01/27/2013    Procedure: THORACIC ELEVEN, THORACIC TWELVE KYPHOPLASTY;  Surgeon: Winfield Cunas, MD;  Location: [facility];  Service: Neurosurgery;  Laterality: N/A;  T11 and T12 kyphoplasty  . Open reduction internal fixation (orif) distal radial fracture Right 06/09/2013    Procedure: OPEN REDUCTION INTERNAL FIXATION (ORIF) DISTAL RADIAL FRACTURE;  Surgeon: Linna Hoff, MD;  Location: [facility];  Service: Orthopedics;  Laterality: Right;   Family History  Problem Relation Age of Onset  . Heart disease Brother    History  Substance Use Topics  . Smoking status: Never Smoker   . Smokeless tobacco: Never Used  . Alcohol Use: No   OB History    No data available     Review of Systems  Respiratory: Negative for chest tightness and shortness of breath.   Cardiovascular: Negative for chest pain.  Musculoskeletal: Positive for back pain.  Neurological: Negative for seizures, syncope, speech difficulty, light-headedness and headaches.  All other systems reviewed and are negative.     Allergies  Celebrex; Sulfur; Ibuprofen; Statins; and Nitrofuran derivatives  Home Medications   Prior to  Admission medications   Medication Sig Start Date End Date Taking? Authorizing Provider  alendronate (FOSAMAX) 70 MG tablet Take 70 mg by mouth once a week. Take with a full glass of water on an empty stomach. Take on Wednesday   Yes Historical Provider, MD  amLODipine (NORVASC) 2.5 MG tablet Take 2.5 mg by mouth daily.   Yes Historical Provider, MD  aspirin 81 MG tablet Take 81 mg by mouth daily.   Yes Historical Provider, MD  calcium carbonate (TUMS EX) 750 MG chewable tablet Chew 1 tablet by mouth 2 (two) times daily as needed for heartburn.   Yes Historical Provider, MD  Calcium Carbonate-Vitamin D (CALCIUM 600 + D PO) Take 1 tablet by mouth 2 (two) times daily.   Yes Historical Provider, MD  docusate sodium (COLACE)  100 MG capsule Take 1 capsule (100 mg total) by mouth 2 (two) times daily. 06/12/13  Yes Linna Hoff, MD  ferrous sulfate 325 (65 FE) MG tablet Take 325 mg by mouth daily with breakfast.   Yes Historical Provider, MD  HYDROcodone-acetaminophen (NORCO) 5-325 MG per tablet Take 1 tablet by mouth every 6 (six) hours as needed for moderate pain. 09/06/13  Yes Liam Graham, PA-C  metFORMIN (GLUCOPHAGE) 1000 MG tablet Take 1,000 mg by mouth 2 (two) times daily with a meal.   Yes Historical Provider, MD  polyethylene glycol powder (GLYCOLAX/MIRALAX) powder Take 17 g by mouth daily. 06/15/13  Yes Gerlene Fee, NP  ramipril (ALTACE) 10 MG capsule Take 10 mg by mouth daily.   Yes Historical Provider, MD  vitamin B-12 (CYANOCOBALAMIN) 1000 MCG tablet Take 1,000 mcg by mouth every other day.    Yes Historical Provider, MD  cholecalciferol (VITAMIN D) 1000 UNITS tablet Take 1,000 Units by mouth at bedtime.     Historical Provider, MD  FLUZONE HIGH-DOSE 0.5 ML SUSY Inject 1 Dose as directed once. 03/02/14   Historical Provider, MD  HYDROcodone-acetaminophen (NORCO) 5-325 MG per tablet Take 1 tablet by mouth every 6 (six) hours as needed for moderate pain. Patient not taking: Reported on 04/16/2014 06/12/13   Linna Hoff, MD  vitamin C (ASCORBIC ACID) 500 MG tablet Take 1 tablet (500 mg total) by mouth daily. Patient not taking: Reported on 04/16/2014 06/12/13   Linna Hoff, MD   BP 157/71 mmHg  Pulse 96  Temp(Src) 98 F (36.7 C)  Resp 15  SpO2 94% Physical Exam  Constitutional: She is oriented to person, place, and time. She appears well-developed and well-nourished.  HENT:  Head: Normocephalic.  Eyes: Conjunctivae and EOM are normal. Pupils are equal, round, and reactive to light.  Neck: Normal range of motion. No JVD present.  Cardiovascular: Normal rate, regular rhythm and normal heart sounds.   Pulmonary/Chest: Effort normal and breath sounds normal. She has no wheezes.  Breath sounds  equal bilaterally  Abdominal: She exhibits no distension.  Musculoskeletal:  Pain upon palpation to the upper thoracic vertebrae. No deformity or step-off. Pain on palpation to the lower lumbar vertebrae and sacrum. No deformity.    Neurological: She is alert and oriented to person, place, and time.  3 out of 5 drinks and bilateral upper and lower extremities. Normal sensation. Normal coordination, finger to nose and heel to shin with no dysmetria. Cranial nerves II through X intact  Skin: Skin is warm and dry.  Psychiatric: She has a normal mood and affect.  Vitals reviewed.   ED Course  Procedures (including critical care time)  Labs Review Labs Reviewed - No data to display  Imaging Review Dg Chest 1 View  04/16/2014   CLINICAL DATA:  Fall.  Pain along the spine and shoulders.  EXAM: CHEST - 1 VIEW  COMPARISON:  Multiple exams, including 04/16/2014  FINDINGS: Deformity from the patient old left distal clavicular fracture noted. Chronic 1.7 cm ossific structure along the left axillary pouch/axilla. Prominent left glenohumeral degenerative arthropathy. Chondrocalcinosis.  Atherosclerotic aortic arch. Cardiac contour within normal limits. Left axillary clips.  No pneumothorax or pleural effusion observed. Lower thoracic vertebral augmentation noted. Inferior endplate compression at T10, age indeterminate.  IMPRESSION: 1. Asymmetric degenerative left glenohumeral arthropathy is chronic and stable. Chronic deformity of the left distal clavicle related to the fracture earlier this year. 2. Atherosclerotic aortic arch. 3. Lower thoracic vertebral augmentation at T11. Inferior endplate compression fracture at T10, age indeterminate.   Electronically Signed   By: Sherryl Barters M.D.   On: 04/16/2014 19:00   Dg Thoracic Spine 2 View  04/16/2014   CLINICAL DATA:  Spine pain status post fall.  EXAM: THORACIC SPINE - 2 VIEW  COMPARISON:  02/24/2013  FINDINGS: T11 and T12 vertebroplasties. Mild  inferior endplate T10 height loss. Mild T6 vertebral body height loss. T1 and portion of T2 obscured on the lateral view. No dislocation. Diffuse osteopenia. Atherosclerotic vascular calcifications.  IMPRESSION: Mild compression deformities at T6 and T11 are age indeterminate however new from 2014 and therefore may be acute.   Electronically Signed   By: Carlos Levering M.D.   On: 04/16/2014 18:56   Dg Lumbar Spine 2-3 Views  04/16/2014   CLINICAL DATA:  Fall, spine pain.  EXAM: LUMBAR SPINE - 2-3 VIEW  COMPARISON:  12/20/2012 CT  FINDINGS: Mild rightward curvature of the lumbar spine. Diffuse osteopenia. Progression of inferior endplate T10 fracture. T11 and T12 vertebroplasties. Degenerative disc disease is most pronounced at L2-3 and L5-S1. Minimal retrolisthesis of L2 on L3. Advanced atherosclerotic vascular calcifications. Sacrum is partially obscured by overlying bowel.  IMPRESSION: Mild rightward curvature and multilevel degenerative changes of the lumbar spine.  No acute osseous finding of the lumbar spine.  See separate thoracic spine report.   Electronically Signed   By: Carlos Levering M.D.   On: 04/16/2014 18:59   Dg Pelvis 1-2 Views  04/16/2014   CLINICAL DATA:  Fall backwards.  Pain along the spine.  EXAM: PELVIS - 1-2 VIEW  COMPARISON:  06/08/2013  FINDINGS: Callus formation from prior fractures of the right pubic rami and right sacrum. These fractures are thought to be remote. I do not observe a new pelvic fracture. Bilateral hip prostheses noted.  IMPRESSION: 1. Callus formation compatible with healed fractures of the right pubic rami and right sacrum. No acute fracture is apparent. Bony demineralization noted, indicating osteoporosis.   Electronically Signed   By: Sherryl Barters M.D.   On: 04/16/2014 19:03   Ct Head Wo Contrast  04/16/2014   CLINICAL DATA:  The pt walks with a walker and she lost her balance while walking this am. She fell backwards back pain. Neck pain  EXAM: CT  HEAD WITHOUT CONTRAST  CT CERVICAL SPINE WITHOUT CONTRAST  TECHNIQUE: Multidetector CT imaging of the head and cervical spine was performed following the standard protocol without intravenous contrast. Multiplanar CT image reconstructions of the cervical spine were also generated.  COMPARISON:  Head CT 06/08/2013  FINDINGS: CT HEAD FINDINGS  Acute extra-axial hemorrhage extending along the interhemispheric fissure from the anterior to posterior. This  subdural hematoma measures 8 mm in thickness. The hemorrhage is along the right side of the interhemispheric fissure. The hemorrhage fans s out over the right tentorium. Small amount of blood in the anterior inferior intravenous fissure additionally.  There is a shallow 1-2 mm subdural hematoma over the right frontal lobe anteriorly (image 15).  No hydrocephalus. No intraventricular hemorrhage. No cystic or patent.  There is periventricular and subcortical white matter hypodensities. No CT evidence of acute infarction.  No evidence skull fracture. No skullbase fracture. No fluid the paranasal sinuses or mastoid air cells.  CT CERVICAL SPINE FINDINGS  No prevertebral soft tissue swelling. Normal alignment of cervical vertebral bodies. No loss of vertebral body height. Normal facet articulation. Normal craniocervical junction.  There is multiple levels of mild endplate spurring. There is loss disc height at C6-C7 not changed from prior.  There is 40% loss of anterior vertebral body height at T1 which is new from prior. No retropulsion.  No evidence epidural or paraspinal hematoma.  IMPRESSION: Head CTA  1. Acute subdural hematoma extending along the entirety of the right interhemispheric fissure and right tentorium. 2. Shallow subdural hematoma along the right frontal lobe. 3. No intraventricular hemorrhage, hydrocephalus, or mass effect. 4. Extensive white matter microvascular disease in atrophy. Cervical spine CT  1. Acute compression fracture the T1 vertebral body with  no retropulsion. Approximately 40% loss of vertebral body height anteriorly. 2. Multilevel disc osteophytic disease within the cervical spine \ Critical Value/emergent results were called by telephone at the time of interpretation on 04/16/2014 at 7:14 pm to Dr. Leata Mouse , who verbally acknowledged these results.   Electronically Signed   By: Suzy Bouchard M.D.   On: 04/16/2014 19:14   Ct Cervical Spine Wo Contrast  04/16/2014   CLINICAL DATA:  The pt walks with a walker and she lost her balance while walking this am. She fell backwards back pain. Neck pain  EXAM: CT HEAD WITHOUT CONTRAST  CT CERVICAL SPINE WITHOUT CONTRAST  TECHNIQUE: Multidetector CT imaging of the head and cervical spine was performed following the standard protocol without intravenous contrast. Multiplanar CT image reconstructions of the cervical spine were also generated.  COMPARISON:  Head CT 06/08/2013  FINDINGS: CT HEAD FINDINGS  Acute extra-axial hemorrhage extending along the interhemispheric fissure from the anterior to posterior. This subdural hematoma measures 8 mm in thickness. The hemorrhage is along the right side of the interhemispheric fissure. The hemorrhage fans s out over the right tentorium. Small amount of blood in the anterior inferior intravenous fissure additionally.  There is a shallow 1-2 mm subdural hematoma over the right frontal lobe anteriorly (image 15).  No hydrocephalus. No intraventricular hemorrhage. No cystic or patent.  There is periventricular and subcortical white matter hypodensities. No CT evidence of acute infarction.  No evidence skull fracture. No skullbase fracture. No fluid the paranasal sinuses or mastoid air cells.  CT CERVICAL SPINE FINDINGS  No prevertebral soft tissue swelling. Normal alignment of cervical vertebral bodies. No loss of vertebral body height. Normal facet articulation. Normal craniocervical junction.  There is multiple levels of mild endplate spurring. There is loss disc  height at C6-C7 not changed from prior.  There is 40% loss of anterior vertebral body height at T1 which is new from prior. No retropulsion.  No evidence epidural or paraspinal hematoma.  IMPRESSION: Head CTA  1. Acute subdural hematoma extending along the entirety of the right interhemispheric fissure and right tentorium. 2. Shallow subdural hematoma along the  right frontal lobe. 3. No intraventricular hemorrhage, hydrocephalus, or mass effect. 4. Extensive white matter microvascular disease in atrophy. Cervical spine CT  1. Acute compression fracture the T1 vertebral body with no retropulsion. Approximately 40% loss of vertebral body height anteriorly. 2. Multilevel disc osteophytic disease within the cervical spine \ Critical Value/emergent results were called by telephone at the time of interpretation on 04/16/2014 at 7:14 pm to Dr. Leata Mouse , who verbally acknowledged these results.   Electronically Signed   By: Suzy Bouchard M.D.   On: 04/16/2014 19:14     EKG Interpretation   Date/Time:  Sunday April 16 2014 17:01:23 EST Ventricular Rate:  103 PR Interval:  163 QRS Duration: 95 QT Interval:  335 QTC Calculation: 438 R Axis:   -26 Text Interpretation:  Sinus tachycardia Multiple ventricular premature  complexes Probable LVH with secondary repol abnrm Baseline wander in  lead(s) V5 No significant change since last tracing Confirmed by [employer]   MD, Jenny Reichmann ([id number]) on 04/16/2014 5:08:35 PM      MDM   Final diagnoses:  Fall   80 year old female presents following mechanical fall. Had a mild headache which is now resolved. Primary complaint is of mid thoracic back pain. Chest x-ray shows no displaced rib fractures, no pneumothorax. She does have to acute thoracic spinal fractures. Also 2 small subdural hematomas noted on head CT, however she has a normal neurologic exam at this point. Discussed patient with on-call neurosurgery who will see her in the morning. We will consult trauma  surgery for admission.  Spoke to on-call trauma surgeon who declined admission, we'll consult hospitalist.  I spoke with the on-call hospitalist for admission, initially patient was accepted for admission but after reviewing the images, hospitalist reported they did not feel comfortable taking this patient on their service. We'll plan to now call neurosurgery again.  Spoke with neurosurgery, who again recommends admission to the hospitalist, he will see the patient in the morning, asked me to tell the hospitalist to contact him with any questions.    Leata Mouse, MD 04/16/14 [phone]

## 2014-04-16 NOTE — ED Notes (Signed)
The pty reports that her pain is better

## 2014-04-16 NOTE — ED Notes (Signed)
The pt walks with a walker and she lost her balance while walking this am.  She fell backwards.  Since then she has had pain between both her shoulders and pain in her entire spine.  She  Has pain niw that the pt reports is coming around to her anterior chest

## 2014-04-17 ENCOUNTER — Inpatient Hospital Stay (HOSPITAL_COMMUNITY): Payer: Medicare Other

## 2014-04-17 ENCOUNTER — Encounter (HOSPITAL_COMMUNITY): Payer: Self-pay | Admitting: *Deleted

## 2014-04-17 DIAGNOSIS — E1165 Type 2 diabetes mellitus with hyperglycemia: Secondary | ICD-10-CM

## 2014-04-17 DIAGNOSIS — K219 Gastro-esophageal reflux disease without esophagitis: Secondary | ICD-10-CM | POA: Diagnosis present

## 2014-04-17 DIAGNOSIS — Z7982 Long term (current) use of aspirin: Secondary | ICD-10-CM | POA: Diagnosis not present

## 2014-04-17 DIAGNOSIS — S2231XA Fracture of one rib, right side, initial encounter for closed fracture: Secondary | ICD-10-CM | POA: Diagnosis present

## 2014-04-17 DIAGNOSIS — Z515 Encounter for palliative care: Secondary | ICD-10-CM | POA: Diagnosis not present

## 2014-04-17 DIAGNOSIS — Z66 Do not resuscitate: Secondary | ICD-10-CM | POA: Diagnosis not present

## 2014-04-17 DIAGNOSIS — I739 Peripheral vascular disease, unspecified: Secondary | ICD-10-CM | POA: Diagnosis present

## 2014-04-17 DIAGNOSIS — Z6825 Body mass index (BMI) 25.0-25.9, adult: Secondary | ICD-10-CM | POA: Diagnosis not present

## 2014-04-17 DIAGNOSIS — I639 Cerebral infarction, unspecified: Secondary | ICD-10-CM | POA: Diagnosis present

## 2014-04-17 DIAGNOSIS — N390 Urinary tract infection, site not specified: Secondary | ICD-10-CM | POA: Diagnosis present

## 2014-04-17 DIAGNOSIS — Z96643 Presence of artificial hip joint, bilateral: Secondary | ICD-10-CM | POA: Diagnosis present

## 2014-04-17 DIAGNOSIS — M4854XA Collapsed vertebra, not elsewhere classified, thoracic region, initial encounter for fracture: Secondary | ICD-10-CM | POA: Diagnosis present

## 2014-04-17 DIAGNOSIS — S065X0A Traumatic subdural hemorrhage without loss of consciousness, initial encounter: Secondary | ICD-10-CM | POA: Diagnosis present

## 2014-04-17 DIAGNOSIS — E46 Unspecified protein-calorie malnutrition: Secondary | ICD-10-CM | POA: Diagnosis present

## 2014-04-17 DIAGNOSIS — I6200 Nontraumatic subdural hemorrhage, unspecified: Secondary | ICD-10-CM | POA: Diagnosis present

## 2014-04-17 DIAGNOSIS — E871 Hypo-osmolality and hyponatremia: Secondary | ICD-10-CM

## 2014-04-17 DIAGNOSIS — R4020 Unspecified coma: Secondary | ICD-10-CM | POA: Diagnosis not present

## 2014-04-17 DIAGNOSIS — R64 Cachexia: Secondary | ICD-10-CM | POA: Diagnosis present

## 2014-04-17 DIAGNOSIS — J9601 Acute respiratory failure with hypoxia: Secondary | ICD-10-CM | POA: Diagnosis not present

## 2014-04-17 DIAGNOSIS — Z9181 History of falling: Secondary | ICD-10-CM | POA: Diagnosis not present

## 2014-04-17 DIAGNOSIS — R34 Anuria and oliguria: Secondary | ICD-10-CM | POA: Diagnosis not present

## 2014-04-17 DIAGNOSIS — G40901 Epilepsy, unspecified, not intractable, with status epilepticus: Secondary | ICD-10-CM | POA: Diagnosis present

## 2014-04-17 DIAGNOSIS — W1830XA Fall on same level, unspecified, initial encounter: Secondary | ICD-10-CM | POA: Diagnosis present

## 2014-04-17 DIAGNOSIS — M546 Pain in thoracic spine: Secondary | ICD-10-CM | POA: Diagnosis present

## 2014-04-17 DIAGNOSIS — G9340 Encephalopathy, unspecified: Secondary | ICD-10-CM | POA: Diagnosis present

## 2014-04-17 DIAGNOSIS — Z22330 Carrier of Group B streptococcus: Secondary | ICD-10-CM | POA: Diagnosis not present

## 2014-04-17 DIAGNOSIS — D649 Anemia, unspecified: Secondary | ICD-10-CM | POA: Diagnosis present

## 2014-04-17 DIAGNOSIS — Z853 Personal history of malignant neoplasm of breast: Secondary | ICD-10-CM | POA: Diagnosis not present

## 2014-04-17 DIAGNOSIS — I10 Essential (primary) hypertension: Secondary | ICD-10-CM | POA: Diagnosis present

## 2014-04-17 DIAGNOSIS — S22000A Wedge compression fracture of unspecified thoracic vertebra, initial encounter for closed fracture: Secondary | ICD-10-CM | POA: Insufficient documentation

## 2014-04-17 LAB — COMPREHENSIVE METABOLIC PANEL
ALBUMIN: 3.4 g/dL — AB (ref 3.5–5.2)
ALK PHOS: 55 U/L (ref 39–117)
ALT: 22 U/L (ref 0–35)
ALT: 19 U/L (ref 0–35)
ANION GAP: 15 (ref 5–15)
AST: 26 U/L (ref 0–37)
AST: 20 U/L (ref 0–37)
Albumin: 3.5 g/dL (ref 3.5–5.2)
Alkaline Phosphatase: 54 U/L (ref 39–117)
Anion gap: 16 — ABNORMAL HIGH (ref 5–15)
BUN: 12 mg/dL (ref 6–23)
BUN: 11 mg/dL (ref 6–23)
CHLORIDE: 96 meq/L (ref 96–112)
CHLORIDE: 97 meq/L (ref 96–112)
CO2: 23 mEq/L (ref 19–32)
CO2: 22 mEq/L (ref 19–32)
CREATININE: 0.78 mg/dL (ref 0.50–1.10)
Calcium: 8.9 mg/dL (ref 8.4–10.5)
Calcium: 8.6 mg/dL (ref 8.4–10.5)
Creatinine, Ser: 0.65 mg/dL (ref 0.50–1.10)
GFR calc Af Amer: 88 mL/min — ABNORMAL LOW (ref >90)
GFR calc Af Amer: >90 mL/min (ref >90)
GFR calc non Af Amer: 76 mL/min — ABNORMAL LOW (ref >90)
GFR calc non Af Amer: 80 mL/min — ABNORMAL LOW (ref >90)
GLUCOSE: 268 mg/dL — AB (ref 70–99)
Glucose, Bld: 155 mg/dL — ABNORMAL HIGH (ref 70–99)
POTASSIUM: 4.2 meq/L (ref 3.7–5.3)
POTASSIUM: 4 meq/L (ref 3.7–5.3)
Sodium: 134 mEq/L — ABNORMAL LOW (ref 137–147)
Sodium: 135 mEq/L — ABNORMAL LOW (ref 137–147)
Total Bilirubin: 0.3 mg/dL (ref 0.3–1.2)
Total Bilirubin: 0.6 mg/dL (ref 0.3–1.2)
Total Protein: 6.6 g/dL (ref 6.0–8.3)
Total Protein: 6.5 g/dL (ref 6.0–8.3)

## 2014-04-17 LAB — MRSA PCR SCREENING: MRSA BY PCR: NEGATIVE

## 2014-04-17 LAB — CBC
HEMATOCRIT: 34.8 % — AB (ref 36.0–46.0)
Hemoglobin: 11.9 g/dL — ABNORMAL LOW (ref 12.0–15.0)
MCH: 33.7 pg (ref 26.0–34.0)
MCHC: 34.2 g/dL (ref 30.0–36.0)
MCV: 98.6 fL (ref 78.0–100.0)
Platelets: 176 10*3/uL (ref 150–400)
RBC: 3.53 MIL/uL — ABNORMAL LOW (ref 3.87–5.11)
RDW: 13.5 % (ref 11.5–15.5)
WBC: 7.7 10*3/uL (ref 4.0–10.5)

## 2014-04-17 LAB — URINALYSIS, ROUTINE W REFLEX MICROSCOPIC
Bilirubin Urine: NEGATIVE
HGB URINE DIPSTICK: NEGATIVE
KETONES UR: 15 mg/dL — AB
Leukocytes, UA: NEGATIVE
Nitrite: NEGATIVE
PROTEIN: NEGATIVE mg/dL
Specific Gravity, Urine: 1.028 (ref 1.005–1.030)
Urobilinogen, UA: 1 mg/dL (ref 0.0–1.0)
pH: 5.5 (ref 5.0–8.0)

## 2014-04-17 LAB — GLUCOSE, CAPILLARY
GLUCOSE-CAPILLARY: 254 mg/dL — AB (ref 70–99)
Glucose-Capillary: 193 mg/dL — ABNORMAL HIGH (ref 70–99)

## 2014-04-17 LAB — CBG MONITORING, ED: Glucose-Capillary: 136 mg/dL — ABNORMAL HIGH (ref 70–99)

## 2014-04-17 LAB — CBC WITH DIFFERENTIAL/PLATELET
BASOS PCT: 0 % (ref 0–1)
Basophils Absolute: 0 10*3/uL (ref 0.0–0.1)
EOS ABS: 0.1 10*3/uL (ref 0.0–0.7)
Eosinophils Relative: 1 % (ref 0–5)
HEMATOCRIT: 34.7 % — AB (ref 36.0–46.0)
Hemoglobin: 11.8 g/dL — ABNORMAL LOW (ref 12.0–15.0)
LYMPHS ABS: 1.4 10*3/uL (ref 0.7–4.0)
Lymphocytes Relative: 16 % (ref 12–46)
MCH: 32.9 pg (ref 26.0–34.0)
MCHC: 34 g/dL (ref 30.0–36.0)
MCV: 96.7 fL (ref 78.0–100.0)
MONO ABS: 0.9 10*3/uL (ref 0.1–1.0)
MONOS PCT: 10 % (ref 3–12)
NEUTROS ABS: 6.4 10*3/uL (ref 1.7–7.7)
NEUTROS PCT: 74 % (ref 43–77)
Platelets: 200 10*3/uL (ref 150–400)
RBC: 3.59 MIL/uL — AB (ref 3.87–5.11)
RDW: 13.5 % (ref 11.5–15.5)
WBC: 8.7 10*3/uL (ref 4.0–10.5)

## 2014-04-17 LAB — HEMOGLOBIN A1C
HEMOGLOBIN A1C: 6.6 % — AB (ref <5.7)
MEAN PLASMA GLUCOSE: 143 mg/dL — AB (ref <117)

## 2014-04-17 LAB — PHOSPHORUS: PHOSPHORUS: 3 mg/dL (ref 2.3–4.6)

## 2014-04-17 LAB — TROPONIN I: Troponin I: <0.3 ng/mL (ref <0.30)

## 2014-04-17 LAB — MAGNESIUM: MAGNESIUM: 1.1 mg/dL — AB (ref 1.5–2.5)

## 2014-04-17 LAB — URINE MICROSCOPIC-ADD ON

## 2014-04-17 LAB — APTT: aPTT: 28 seconds (ref 24–37)

## 2014-04-17 LAB — PROTIME-INR
INR: 1.16 (ref 0.00–1.49)
PROTHROMBIN TIME: 15 s (ref 11.6–15.2)

## 2014-04-17 LAB — TSH: TSH: 1.39 u[IU]/mL (ref 0.350–4.500)

## 2014-04-17 MED ORDER — HYDROCODONE-ACETAMINOPHEN 5-325 MG PO TABS
1.0000 | ORAL_TABLET | Freq: Four times a day (QID) | ORAL | Status: DC | PRN
  Administered 2014-04-17 – 2014-04-19 (×5): 1 via ORAL
  Filled 2014-04-17 (×6): qty 1

## 2014-04-17 MED ORDER — DOCUSATE SODIUM 100 MG PO CAPS: 100.0000 mg | ORAL_CAPSULE | Freq: Two times a day (BID) | ORAL | Status: DC

## 2014-04-17 MED ORDER — ACETAMINOPHEN 650 MG RE SUPP
650.0000 mg | Freq: Four times a day (QID) | RECTAL | Status: DC | PRN
Start: 2014-04-17 — End: 2014-04-24

## 2014-04-17 MED ORDER — CALCIUM CARBONATE ANTACID 500 MG PO CHEW
1.5000 | CHEWABLE_TABLET | Freq: Two times a day (BID) | ORAL | Status: DC
  Administered 2014-04-17 – 2014-04-18 (×2): 300 mg via ORAL
  Filled 2014-04-17 (×6): qty 1.5

## 2014-04-17 MED ORDER — VITAMIN D3 25 MCG (1000 UNIT) PO TABS
1000.0000 [IU] | ORAL_TABLET | Freq: Every day | ORAL | Status: DC
  Administered 2014-04-17: 1000 [IU] via ORAL
  Filled 2014-04-17 (×3): qty 1

## 2014-04-17 MED ORDER — DOCUSATE SODIUM 100 MG PO CAPS
100.0000 mg | ORAL_CAPSULE | Freq: Two times a day (BID) | ORAL | Status: DC
  Administered 2014-04-17 – 2014-04-19 (×3): 100 mg via ORAL
  Filled 2014-04-17 (×3): qty 1

## 2014-04-17 MED ORDER — VITAMIN B-12 1000 MCG PO TABS
1000.0000 ug | ORAL_TABLET | ORAL | Status: DC
  Administered 2014-04-18: 1000 ug via ORAL
  Filled 2014-04-17: qty 1

## 2014-04-17 MED ORDER — ACETAMINOPHEN 325 MG PO TABS
650.0000 mg | ORAL_TABLET | Freq: Four times a day (QID) | ORAL | Status: DC | PRN
Start: 2014-04-17 — End: 2014-04-19

## 2014-04-17 MED ORDER — ONDANSETRON HCL 4 MG PO TABS: 4.0000 mg | ORAL_TABLET | Freq: Four times a day (QID) | ORAL | Status: DC | PRN

## 2014-04-17 MED ORDER — HYDROCODONE-ACETAMINOPHEN 5-325 MG PO TABS: 1.0000 | ORAL_TABLET | ORAL | Status: DC | PRN

## 2014-04-17 MED ORDER — AMLODIPINE BESYLATE 2.5 MG PO TABS
2.5000 mg | ORAL_TABLET | Freq: Every day | ORAL | Status: DC
  Administered 2014-04-17: 2.5 mg via ORAL
  Filled 2014-04-17: qty 1

## 2014-04-17 MED ORDER — DEXTROSE 5 % IV SOLN
1.0000 g | INTRAVENOUS | Status: DC
  Administered 2014-04-17 – 2014-04-22 (×6): 1 g via INTRAVENOUS
  Filled 2014-04-17 (×6): qty 10

## 2014-04-17 MED ORDER — POLYETHYLENE GLYCOL 3350 17 GM/SCOOP PO POWD
17.0000 g | Freq: Every day | ORAL | Status: DC
  Administered 2014-04-18: 17 g via ORAL
  Filled 2014-04-17 (×2): qty 255

## 2014-04-17 MED ORDER — CALCIUM CARBONATE ANTACID 750 MG PO CHEW: 1.0000 | CHEWABLE_TABLET | Freq: Two times a day (BID) | ORAL | Status: DC

## 2014-04-17 MED ORDER — SODIUM CHLORIDE 0.9 % IJ SOLN
3.0000 mL | Freq: Two times a day (BID) | INTRAMUSCULAR | Status: DC
  Administered 2014-04-17 – 2014-04-22 (×11): 3 mL via INTRAVENOUS
  Administered 2014-04-22: 6 mL via INTRAVENOUS
  Administered 2014-04-23 – 2014-05-05 (×18): 3 mL via INTRAVENOUS
  Filled 2014-04-17: qty 3

## 2014-04-17 MED ORDER — AMLODIPINE BESYLATE 5 MG PO TABS
5.0000 mg | ORAL_TABLET | Freq: Every day | ORAL | Status: DC
  Administered 2014-04-18: 5 mg via ORAL
  Filled 2014-04-17: qty 1

## 2014-04-17 MED ORDER — MORPHINE SULFATE 2 MG/ML IJ SOLN: 1.0000 mg | INTRAMUSCULAR | Status: DC | PRN

## 2014-04-17 MED ORDER — ONDANSETRON HCL 4 MG/2ML IJ SOLN
4.0000 mg | Freq: Four times a day (QID) | INTRAMUSCULAR | Status: DC | PRN
  Administered 2014-04-18: 4 mg via INTRAVENOUS
  Filled 2014-04-17: qty 2

## 2014-04-17 MED ORDER — RAMIPRIL 10 MG PO CAPS
10.0000 mg | ORAL_CAPSULE | Freq: Every day | ORAL | Status: DC
  Administered 2014-04-18 – 2014-04-19 (×2): 10 mg via ORAL
  Filled 2014-04-17 (×3): qty 1

## 2014-04-17 NOTE — ED Notes (Signed)
Pt dozed while talking

## 2014-04-17 NOTE — ED Notes (Signed)
There are no stepdown beds available .  The pt will need to spend the night in the ed.   A regular hospital bed has been requested for the pts comfort

## 2014-04-17 NOTE — ED Notes (Signed)
Pt moved to a regular hosp bed for comfort.  More blood drawn.  Pt remains alert moves all 4 extremities.  Voided once.  Urine sent to lab

## 2014-04-17 NOTE — ED Notes (Signed)
Checked patient blood sugar it was 136 notified RN Abigail Butts of blood sugar

## 2014-04-17 NOTE — ED Notes (Signed)
Both pupils 2/0  Equal and react

## 2014-04-17 NOTE — ED Notes (Signed)
Orders from admitting changed from neuro checks  Every 2 hours to every 4 hours

## 2014-04-17 NOTE — ED Notes (Signed)
The pt was given  Food after she  Passed her swallow screen   Order per edp

## 2014-04-17 NOTE — Progress Notes (Signed)
[facility] TEAM 1 - Stepdown/ICU TEAM Progress Note  Diane Stevenson KDT:[id number] DOB: August 29, 1929 DOA: 04/16/2014 PCP: Irven Shelling, MD  Admit HPI / Brief Narrative: 80 y.o. female w/ a history of Breast cancer (08/04/1995); Hypertension; Diabetes mellitus; GERD; and DJD who presented after a loss of balance and a fall backwards, during which she hit the back ofher head on the floor and the wall. Patient did not loose consciousness. Patient was brought to [facility] ER and CT showed acute subdural hematoma extending along the entirety of the right interhemispheric fissure and right tentorium as well as an acute compression fracture of the T1 vertebral body with no retropulsion. Also noted was a shallow subdural hematoma along the right frontal lobe. The case was discussed at length with Dr. Ellene Route with Neurosurgery who believed the patient would not require operative intervention.  HPI/Subjective: Pt seen for a f/u visit.  Assessment/Plan:  Interhemispheric subdural hematoma Stable on f/u CT  Acute T1 Compression fx  UTI  HTN  Code Status: FULL Family Communication: no family present at time of exam Disposition Plan: SDU  Consultants: NS  Procedures: none  Antibiotics: Rocephin 11/23 >  DVT prophylaxis: SCDs only   Objective: Blood pressure 168/84, pulse 93, temperature 98.1 F (36.7 C), temperature source Oral, resp. rate 20, height 5\' 5"  (1.651 m), weight 66.2 kg (145 lb 15.1 oz), SpO2 95 %.  Intake/Output Summary (Last 24 hours) at 04/17/14 1356 Last data filed at 04/17/14 0720  Gross per 24 hour  Intake    100 ml  Output    500 ml  Net   -400 ml   Exam: F/U exam completed  Data Reviewed: Basic Metabolic Panel:  Recent Labs Lab 04/17/14 0155 04/17/14 1036  NA 134* 135*  K 4.2 4.0  CL 96 97  CO2 23 22  GLUCOSE 268* 155*  BUN 12 11  CREATININE 0.78 0.65  CALCIUM 8.9 8.6  MG  --  1.1*  PHOS  --  3.0    Liver Function Tests:  Recent Labs Lab  04/17/14 0155 04/17/14 1036  AST 26 20  ALT 22 19  ALKPHOS 54 55  BILITOT 0.3 0.6  PROT 6.6 6.5  ALBUMIN 3.5 3.4*    Coags:  Recent Labs Lab 04/17/14 0155  INR 1.16    Recent Labs Lab 04/17/14 0155  APTT 28    CBC:  Recent Labs Lab 04/17/14 0155 04/17/14 1036  WBC 8.7 7.7  NEUTROABS 6.4  --   HGB 11.8* 11.9*  HCT 34.7* 34.8*  MCV 96.7 98.6  PLT 200 176    Cardiac Enzymes:  Recent Labs Lab 04/17/14 0155 04/17/14 0810  TROPONINI <0.30 <0.30   CBG:  Recent Labs Lab 04/17/14 0905  GLUCAP 136*    Recent Results (from the past 240 hour(s))  MRSA PCR Screening     Status: None   Collection Time: 04/17/14 12:06 PM  Result Value Ref Range Status   MRSA by PCR NEGATIVE NEGATIVE Final    Comment:        The GeneXpert MRSA Assay (FDA approved for NASAL specimens only), is one component of a comprehensive MRSA colonization surveillance program. It is not intended to diagnose MRSA infection nor to guide or monitor treatment for MRSA infections.      Studies:  Recent x-ray studies have been reviewed in detail by the Attending Physician  Scheduled Meds:  Scheduled Meds: . amLODipine  2.5 mg Oral Daily  . cefTRIAXone (ROCEPHIN)  IV  1 g Intravenous Q24H  . docusate sodium  100 mg Oral BID  . sodium chloride  3 mL Intravenous Q12H    Time spent on care of this patient: 25+ mins   [name] T , MD   [facility] Office  [phone] Pager - Text Page per Shea Evans as per below:  On-Call/Text Page:      [email or web address]      password [email or web address]  If 7PM-7AM, please contact night-coverage [email or web address] Password [email or web address] 04/17/2014, 1:56 PM   LOS: 1 day

## 2014-04-17 NOTE — ED Notes (Signed)
Attempted report 

## 2014-04-17 NOTE — ED Notes (Signed)
Pt not going to 2H,  Carb modified diet placed for pt

## 2014-04-17 NOTE — ED Notes (Signed)
Pt has no pain unless she moves no headache.  Both pupils equal and react  Pupils 2.0 both eyes

## 2014-04-17 NOTE — Progress Notes (Signed)
Patient ID: Diane Stevenson, female   DOB: [birth year], 80 y.o.   MRN: [id number] Patient seen early this morning. Neurologically she appears intact without evidence of any weakness in her upper or lower extremities. Cranial nerve examination is also intact with pupils being brisk extraocular movements being full and face symmetric. Tongue and uvula were in the midline. Cerebellar testing was not formally completed.  New CT of the head from this morning demonstrates that the subdural appears stable. There's been no further enlargement. A CT scan of the thoracic spine was ordered as the patient has been having mid and lower thoracic pain. The CT of the thoracic spine demonstrates what appears to be T1 T6 and T10 fractures. All of which may be acute or subacute. Bilateral lamina fractures may be present at T10, Discussed with Dr. Lars Pinks . MRI would be helpful to delimit extent of thes mostly osteoporotic fractures.

## 2014-04-17 NOTE — ED Notes (Signed)
The triponin was not resulted at 0200 but the blood is still in the lab and lab will run and result it from that blood,.  The next triponin is due at 0800am

## 2014-04-17 NOTE — Progress Notes (Signed)
Utilization Review Completed.Diane Stevenson T11/23/2015  

## 2014-04-17 NOTE — ED Notes (Signed)
The pts family went home

## 2014-04-17 NOTE — ED Notes (Signed)
Pt awake pulling at  Her monitor wires yet she is oriented.. Skin warm and dry.

## 2014-04-17 NOTE — ED Notes (Signed)
Admitting MD paged re: NPO status per family request

## 2014-04-18 ENCOUNTER — Inpatient Hospital Stay (HOSPITAL_COMMUNITY): Payer: Medicare Other

## 2014-04-18 ENCOUNTER — Encounter (HOSPITAL_COMMUNITY): Payer: Self-pay | Admitting: Radiology

## 2014-04-18 DIAGNOSIS — S22010B Wedge compression fracture of first thoracic vertebra, initial encounter for open fracture: Secondary | ICD-10-CM

## 2014-04-18 DIAGNOSIS — E871 Hypo-osmolality and hyponatremia: Secondary | ICD-10-CM | POA: Diagnosis present

## 2014-04-18 DIAGNOSIS — R4020 Unspecified coma: Secondary | ICD-10-CM | POA: Diagnosis not present

## 2014-04-18 DIAGNOSIS — S2231XA Fracture of one rib, right side, initial encounter for closed fracture: Secondary | ICD-10-CM | POA: Diagnosis present

## 2014-04-18 DIAGNOSIS — S22010A Wedge compression fracture of first thoracic vertebra, initial encounter for closed fracture: Secondary | ICD-10-CM | POA: Diagnosis present

## 2014-04-18 DIAGNOSIS — R296 Repeated falls: Secondary | ICD-10-CM | POA: Diagnosis present

## 2014-04-18 DIAGNOSIS — I10 Essential (primary) hypertension: Secondary | ICD-10-CM | POA: Diagnosis present

## 2014-04-18 DIAGNOSIS — N390 Urinary tract infection, site not specified: Secondary | ICD-10-CM

## 2014-04-18 DIAGNOSIS — E119 Type 2 diabetes mellitus without complications: Secondary | ICD-10-CM | POA: Diagnosis present

## 2014-04-18 LAB — BASIC METABOLIC PANEL
ANION GAP: 14 (ref 5–15)
BUN: 11 mg/dL (ref 6–23)
CHLORIDE: 93 meq/L — AB (ref 96–112)
CO2: 22 meq/L (ref 19–32)
CREATININE: 0.66 mg/dL (ref 0.50–1.10)
Calcium: 8.4 mg/dL (ref 8.4–10.5)
GFR calc Af Amer: >90 mL/min (ref >90)
GFR calc non Af Amer: 80 mL/min — ABNORMAL LOW (ref >90)
GLUCOSE: 169 mg/dL — AB (ref 70–99)
Potassium: 4 mEq/L (ref 3.7–5.3)
Sodium: 129 mEq/L — ABNORMAL LOW (ref 137–147)

## 2014-04-18 LAB — URINE CULTURE: Colony Count: 75000

## 2014-04-18 LAB — GLUCOSE, CAPILLARY
GLUCOSE-CAPILLARY: 227 mg/dL — AB (ref 70–99)
GLUCOSE-CAPILLARY: 330 mg/dL — AB (ref 70–99)
Glucose-Capillary: 271 mg/dL — ABNORMAL HIGH (ref 70–99)
Glucose-Capillary: 220 mg/dL — ABNORMAL HIGH (ref 70–99)

## 2014-04-18 MED ORDER — HYDRALAZINE HCL 20 MG/ML IJ SOLN: 5.0000 mg | INTRAMUSCULAR | Status: DC | PRN

## 2014-04-18 MED ORDER — MORPHINE SULFATE 2 MG/ML IJ SOLN: 1.0000 mg | INTRAMUSCULAR | Status: DC | PRN

## 2014-04-18 MED ORDER — INSULIN ASPART 100 UNIT/ML ~~LOC~~ SOLN
0.0000 [IU] | SUBCUTANEOUS | Status: DC
  Administered 2014-04-18 – 2014-04-19 (×6): 5 [IU] via SUBCUTANEOUS
  Administered 2014-04-20: 3 [IU] via SUBCUTANEOUS
  Administered 2014-04-20: 5 [IU] via SUBCUTANEOUS

## 2014-04-18 MED ORDER — AMLODIPINE BESYLATE 10 MG PO TABS
10.0000 mg | ORAL_TABLET | Freq: Every day | ORAL | Status: DC
  Administered 2014-04-19: 10 mg via ORAL
  Filled 2014-04-18: qty 1

## 2014-04-18 MED ORDER — AMLODIPINE BESYLATE 5 MG PO TABS
5.0000 mg | ORAL_TABLET | Freq: Once | ORAL | Status: DC
  Filled 2014-04-18: qty 1

## 2014-04-18 MED ORDER — AMLODIPINE BESYLATE 5 MG PO TABS
5.0000 mg | ORAL_TABLET | Freq: Every day | ORAL | Status: DC
Start: 2014-04-18 — End: 2014-04-18

## 2014-04-18 NOTE — Plan of Care (Signed)
Problem: Phase I Progression Outcomes Goal: Pain controlled with appropriate interventions Outcome: Progressing     

## 2014-04-18 NOTE — Progress Notes (Signed)
Patient ID: Diane Stevenson, female   DOB: [birth year], 80 y.o.   MRN: [id number] Patient is more lethargic today. Seems a bit confused also. [place] may be related to pain medication or it may be related to her subdural hematoma. I discussed the situation with the patient's daughter. I also noted that I would like to obtain an MRI of the thoracic spine because of the T10 fracture. CT scan of the brain will be ordered. I discussed prognosis with this process. Patient already has been somewhat dependent for the past 3 years. She may ultimately need a skilled nursing facility but I don't believe that she'll be ready to go home anytime 7.

## 2014-04-18 NOTE — Progress Notes (Signed)
Shift event: RN paged earlier stating his assessment of pt's mental status was different from what was reported by prior shift RN. Per RN, pt more lethargic and confused. Slow to follow commands, if does so at all. [facility] is following pt for SDH and had ordered a r/p CT scan of head for tonight. Just so happens, that at this time, CT tech here to take pt to test. CT showed increase in SDH and Dr. Vertell Limber of [facility] came to bedside to evaluate pt. Pt was more awake and said her name. See [facility] note. Per [facility], no new treatment or surgery warranted at this time. They will continue to follow.  Clance Boll, NP [facility]

## 2014-04-18 NOTE — Progress Notes (Signed)
Pt responds to touch, following commands noted to be gazing to right, incontinent of urine while cleaning up patient Pt with vomiting bile colored emesis notified NP CT scan ordered prior to present events scan changed to stat will continue to monitor closely.

## 2014-04-18 NOTE — Progress Notes (Signed)
Subjective: Patient reports moaning, but says "yes" and "no" and "Velvie."  Objective: Vital signs in last 24 hours: Temp:  [97.2 F (36.2 C)-98.5 F (36.9 C)] 97.2 F (36.2 C) (11/24 1900) Pulse Rate:  [80-104] 80 (11/24 1900) Resp:  [14-19] 14 (11/24 1900) BP: (143-188)/(61-86) 143/61 mmHg (11/24 1900) SpO2:  [96 %-97 %] 97 % (11/24 1900)  Intake/Output from previous day: 11/23 0701 - 11/24 0700 In: 243 [P.O.:240; I.V.:3] Out: 600 [Urine:600] Intake/Output this shift:    Physical Exam: Opens eyes with stimulation.  Will state name and move all extremities.  Not following commands.  Lab Results:  Recent Labs  04/17/14 0155 04/17/14 1036  WBC 8.7 7.7  HGB 11.8* 11.9*  HCT 34.7* 34.8*  PLT 200 176   BMET  Recent Labs  04/17/14 1036 04/18/14 0024  NA 135* 129*  K 4.0 4.0  CL 97 93*  CO2 22 22  GLUCOSE 155* 169*  BUN 11 11  CREATININE 0.65 0.66  CALCIUM 8.6 8.4    Studies/Results: Ct Head Wo Contrast  04/18/2014   CLINICAL DATA:  Known subdural hematoma, recent change in mental status  EXAM: CT HEAD WITHOUT CONTRAST  TECHNIQUE: Contiguous axial images were obtained from the base of the skull through the vertex without intravenous contrast.  COMPARISON:  04/17/2014  FINDINGS: There has been an increase in the degree of subdural hematoma along the falx on the right. It now measures almost 17 mm in thickness. Very minimal midline shift from right to left is noted. Additionally, there has been increase in the degree of subdural hematoma along the right frontal, parietal and temporal regions now measuring approximately 3 mm. No acute infarct is noted.  IMPRESSION: Increasing subdural hematoma along the falx cerebri as well as in the right frontal, parietal and temporal regions. Minimal midline shift from right to left is noted.  These results were called by telephone at the time of interpretation on 04/18/2014 at 9:48 pm to Truman Hayward, the patients nurse who verbally  acknowledged these results.   Electronically Signed   By: Inez Catalina M.D.   On: 04/18/2014 21:48   Ct Head Wo Contrast  04/17/2014   ADDENDUM REPORT: 04/17/2014 16:54  ADDENDUM: This addendum is for the interpretation of the thoracic spine CT at 0934 hr, discovered to be missing report.  Comparison: [facility] thoracic spine radiographs 02/24/2013. Lumbar MRI 01/19/2013. Cervical spine CT 04/16/2014.  FINDINGS:  Osteopenia. A chronic severe T12 compression fracture re- identified. That level and the compressed T11 level are now status post augmentation as seen in October.  Moderate compression of the T1 vertebral body with anterior wedging appears acute and stable since yesterday.  Moderate T6 compression fracture with acute fracture plane evident anteriorly (series 3, image 66). Minimal retropulsion of the posterior inferior endplate. Superimposed posterior element hypertrophy at that level. AP thecal sac maintained at 11 mm.  At T10 there is an inferior endplate fracture with moderate loss of vertebral body height. There is associated vacuum phenomena. There was some loss of height at this level on 02/24/2013. There is sclerosis developing along the inferior endplate.  However, sagittal images today suggest the possibility of nondisplaced bilateral T10 pars fractures, more suspicious on the left (series 6, image 26). On axial images there seems to be non displaced discontinuity of the right pars on series 3, image 111.  There is multifactorial T10-T11 spinal stenosis with bulky posterior element hypertrophy and ligament flavum calcification. This appears exacerbated by T10-T11 disc osteophyte  complex.  There is lower thoracic kyphosis exacerbated by the T10 findings.  There is also evidence of a nondisplaced right eleventh rib costovertebral fracture (series 3, image 121). L1 appears intact.  No other acute rib fracture identified.  Prominent posterior disc protrusions at T7-T8 and T8-T9.  Dependent pulmonary atelectasis. Chronic appearing distal left clavicle fracture. Calcified atherosclerosis of the aorta and coronary arteries. Negative visualized non contrast upper abdominal viscera.  CONCLUSION:  1. Acute T1 and T6 compression fractures. No significant retropulsion of bone. 2. Probably subacute T10 compression fracture, however, suggestion of acute nondisplaced bilateral T10 pars fractures. 3. Multifactorial T10-T11 spinal stenosis. 4. Follow-up thoracic spine MRI may be valuable. 5. Nondisplaced right eleventh rib fracture. 6. Chronic T11 and T12 compression fracture status post augmentation. Preliminary report of this exam discussed by telephone with Dr. Kristeen Miss on 04/17/2014 at 1627 hrs.   Electronically Signed   By: Lars Pinks M.D.   On: 04/17/2014 16:54   04/17/2014   CLINICAL DATA:  Subdural hematoma from recent fall  EXAM: CT HEAD WITHOUT CONTRAST  TECHNIQUE: Contiguous axial images were obtained from the base of the skull through the vertex without intravenous contrast.  COMPARISON:  April 16, 2014  FINDINGS: Moderate diffuse atrophy is stable. The previously noted parafalcine subdural hematoma extending into the superior medial right tentorium is again noted. This hematoma has a maximum thickness in the anterior parafalcine region of 8 mm, stable. No extension of hematoma is appreciable. The minimal right frontal subdural hematoma seen on the study 1 day prior may be marginally smaller at this time. It is best seen currently on axial slice 14 series 3 with a maximum thickness of 2 mm. There is no mass effect or edema from this minimal right subdural hematoma. No new subdural or epidural fluid collections are identified. There is no midline shift. There is no intra-axial hemorrhage or mass. There is patchy small vessel disease in the centra semiovale bilaterally, stable. No new gray-white compartment lesions. The bony calvarium appears intact. The mastoid air cells are clear.  There is mild ethmoid sinus disease bilaterally. There is minimal mucosal thickening in the left sphenoid sinus.  IMPRESSION: Stable subdural hematomas without significant mass effect or edema. No midline shift. No intra-axial hemorrhage. No new extra-axial fluid. Atrophy with small vessel disease is stable. No new lesion.  Electronically Signed: By: Lowella Grip M.D. On: 04/17/2014 09:54   Ct Thoracic Spine Wo Contrast  04/17/2014   ADDENDUM REPORT: 04/17/2014 16:54  ADDENDUM: This addendum is for the interpretation of the thoracic spine CT at 0934 hr, discovered to be missing report.  Comparison: [facility] thoracic spine radiographs 02/24/2013. Lumbar MRI 01/19/2013. Cervical spine CT 04/16/2014.  FINDINGS:  Osteopenia. A chronic severe T12 compression fracture re- identified. That level and the compressed T11 level are now status post augmentation as seen in October.  Moderate compression of the T1 vertebral body with anterior wedging appears acute and stable since yesterday.  Moderate T6 compression fracture with acute fracture plane evident anteriorly (series 3, image 66). Minimal retropulsion of the posterior inferior endplate. Superimposed posterior element hypertrophy at that level. AP thecal sac maintained at 11 mm.  At T10 there is an inferior endplate fracture with moderate loss of vertebral body height. There is associated vacuum phenomena. There was some loss of height at this level on 02/24/2013. There is sclerosis developing along the inferior endplate.  However, sagittal images today suggest the possibility of nondisplaced bilateral T10 pars fractures, more  suspicious on the left (series 6, image 26). On axial images there seems to be non displaced discontinuity of the right pars on series 3, image 111.  There is multifactorial T10-T11 spinal stenosis with bulky posterior element hypertrophy and ligament flavum calcification. This appears exacerbated by T10-T11 disc osteophyte  complex.  There is lower thoracic kyphosis exacerbated by the T10 findings.  There is also evidence of a nondisplaced right eleventh rib costovertebral fracture (series 3, image 121). L1 appears intact.  No other acute rib fracture identified.  Prominent posterior disc protrusions at T7-T8 and T8-T9. Dependent pulmonary atelectasis. Chronic appearing distal left clavicle fracture. Calcified atherosclerosis of the aorta and coronary arteries. Negative visualized non contrast upper abdominal viscera.  CONCLUSION:  1. Acute T1 and T6 compression fractures. No significant retropulsion of bone. 2. Probably subacute T10 compression fracture, however, suggestion of acute nondisplaced bilateral T10 pars fractures. 3. Multifactorial T10-T11 spinal stenosis. 4. Follow-up thoracic spine MRI may be valuable. 5. Nondisplaced right eleventh rib fracture. 6. Chronic T11 and T12 compression fracture status post augmentation. Preliminary report of this exam discussed by telephone with Dr. Kristeen Miss on 04/17/2014 at 1627 hrs.   Electronically Signed   By: Lars Pinks M.D.   On: 04/17/2014 16:54   04/17/2014   CLINICAL DATA:  Subdural hematoma from recent fall  EXAM: CT HEAD WITHOUT CONTRAST  TECHNIQUE: Contiguous axial images were obtained from the base of the skull through the vertex without intravenous contrast.  COMPARISON:  April 16, 2014  FINDINGS: Moderate diffuse atrophy is stable. The previously noted parafalcine subdural hematoma extending into the superior medial right tentorium is again noted. This hematoma has a maximum thickness in the anterior parafalcine region of 8 mm, stable. No extension of hematoma is appreciable. The minimal right frontal subdural hematoma seen on the study 1 day prior may be marginally smaller at this time. It is best seen currently on axial slice 14 series 3 with a maximum thickness of 2 mm. There is no mass effect or edema from this minimal right subdural hematoma. No new subdural or  epidural fluid collections are identified. There is no midline shift. There is no intra-axial hemorrhage or mass. There is patchy small vessel disease in the centra semiovale bilaterally, stable. No new gray-white compartment lesions. The bony calvarium appears intact. The mastoid air cells are clear. There is mild ethmoid sinus disease bilaterally. There is minimal mucosal thickening in the left sphenoid sinus.  IMPRESSION: Stable subdural hematomas without significant mass effect or edema. No midline shift. No intra-axial hemorrhage. No new extra-axial fluid. Atrophy with small vessel disease is stable. No new lesion.  Electronically Signed: By: Lowella Grip M.D. On: 04/17/2014 09:54   Mr Thoracic Spine Wo Contrast  04/18/2014   CLINICAL DATA:  Upper back pain secondary to a fall two days ago.  EXAM: MRI THORACIC SPINE WITHOUT CONTRAST  TECHNIQUE: Multiplanar, multisequence MR imaging of the thoracic spine was performed. No intravenous contrast was administered.  COMPARISON:  CT scan of the thoracic spine dated 04/17/2014  FINDINGS: There are acute slight compression fractures of T1 and T6. There is no protrusion of bone or disc material into the spinal canal. There is no epidural hemorrhage. There is a injury to the intraspinous ligaments between the spinous processes of C7 and T1 and T1 and T2 as well as a between the spinous processes of T5 and T6 and T6 and T7.  There is suggestion of a tiny focal area of myelopathy of  the anterior aspect of the spinal cord at T3 and there is a central syrinx extending from T6 through T7-8. No visible mass. Facet joints are normal. No foraminal or spinal stenosis.  There is an old compression fracture of the inferior endplate of M03. There is moderate thoracic spinal stenosis at T10-11 due to hypertrophy of the ligamentum flavum a. The spinal cord appears slightly compressed but there is no myelopathy. Tip of the conus is below the superior aspect of L2. There are old  compression fractures of T11 and T12 which have been treated with vertebroplasty.  Incidental note is made of a prominent fluid collection seen in the right axilla, probably a large ganglion cyst originating from right glenohumeral joint. This is incompletely visualized and appears to contain loose body.  IMPRESSION: 1. Acute benign appearing mild compression fractures of T1 and T6. No neural impingement at either level. 2. Old compression fractures of T10, T11, and T12. 3. Moderate thoracic spinal stenosis at T10-11 with slight compression of the spinal cord without myelopathy. 4. 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 5. Tiny area of myelopathy in the anterior aspect of the spinal cord at T3.   Electronically Signed   By: Rozetta Nunnery M.D.   On: 04/18/2014 16:14    Assessment/Plan: Falcine SDH is larger.  No surgery at present.  Will continue to follow patient.    LOS: 2 days    Peggyann Shoals, MD 04/18/2014, 10:13 PM

## 2014-04-18 NOTE — Progress Notes (Signed)
[facility] TEAM 1 - Stepdown/ICU TEAM Progress Note  Diane Stevenson HUT:[id number] DOB: 1929-08-28 DOA: 04/16/2014 PCP: Irven Shelling, MD  Admit HPI / Brief Narrative: 80 y.o. WF PMHx Breast cancer (08/04/1995); Hypertension; Diabetes mellitus; GERD; DJD, frequent falls Presented after a loss of balance and a fall backwards, during which she hit the back ofher head on the floor and the wall. Patient did not loose consciousness. Patient was brought to [facility] ER and CT showed acute subdural hematoma extending along the entirety of the right interhemispheric fissure and right tentorium as well as an acute compression fracture of the T1 vertebral body with no retropulsion. Also noted was a shallow subdural hematoma along the right frontal lobe. The case was discussed at length with Dr. Ellene Route with Neurosurgery who believed the patient would not require operative intervention.  HPI/Subjective: 11/24 A/O 2 (does not know when/why), follows commands, complains of left-sided headache. States negative vision change. Some confusion, lethargy today (per daughter significant change from yesterday)  Assessment/Plan: Interhemispheric subdural hematoma -Stable on f/u CT -Follow-up head CT ordered by neurosurgery.   Acute encephalopathy -Possibly multifactorial to include extension of subdural hematoma, pain medication  Acute T1 Compression fx -Medical management; and control -PT/OT consult  Nondisplaced right 11th rib fracture -Medical management  Multiple subacute fractures consistent with frequent falls -Dependent upon PT/OT evaluation patient may require SNF or assisted living  UTI (group B strep) -Continue ceftriaxone,    HTN -Increase amlodipine to 10 mg daily -Continue ramipril 10 mg daily  Diabetes controlled -11/23 hemoglobin A1c= 6.6 -Current CBG running high,Start moderate SSI -Obtain lipid panel  Hyponatremia -Most likely secondary to SDH (SIADH?)  -Obtain urine  osmolality, urine sodium, serum uric acid, serum osmolality. -Fluid restriction 1000 ml/day ,     Code Status: FULL Family Communication: Daughter present at time of exam Disposition Plan: Per neurosurgery   Consultants: Dr. Kristeen Miss (neurosurgery)    Procedure/Significant Events: 11/22 PCXR;. Asymmetric degenerative left glenohumeral arthropathy is chronic -Chronic deformity of the left distal clavicle related to the fracture earlier this year. -Lower thoracic vertebral augmentation at T11.- Inferior endplate compression fracture at T10 11/22 pelvis x-ray; Callus formation compatible with healed fractures of the right pubic rami and right sacrum. -osteoporosis. 11/22 T-spine x-ray; Mild compression deformities at T6 and T11; new?  11/22 CT C-spine without contrast/head CT; Acute subdural hematoma extending along right interhemispheric fissure and right tentorium. - Shallow subdural hematoma along the right frontal lobe. - No intraventricular hemorrhage, hydrocephalus, or mass effect. - Acute compression fracture the T1 vertebral body with no retropulsion. ~ 40% loss of vertebral body height anteriorly. 11/23 CT T-spine without contrast;Acute T1 and T6 compression fractures.  -Probably subacute T10 compression fracture; suggestion acute nondisplaced bilateral T10 pars fractures.- Multifactorial T10-T11 spinal stenosis.- Nondisplaced right eleventh rib fracture. -Chronic T11 and T12 compression fracture status post augmentation.     Culture 11/23 urine positive group B strep (strep Agalactiae)    Antibiotics: Ceftriaxone 11/23 >>   DVT prophylaxis: SCD  Devices   LINES / TUBES:      Continuous Infusions:   Objective: VITAL SIGNS: Temp: 98.3 F (36.8 C) (11/24 1500) Temp Source: Axillary (11/24 1500) BP: 148/79 mmHg (11/24 1614) Pulse Rate: 90 (11/24 1500) SPO2; FIO2:   Intake/Output Summary (Last 24 hours) at 04/18/14 1911 Last data filed at  04/18/14 0653  Gross per 24 hour  Intake    243 ml  Output    400 ml  Net   -157  ml     Exam: General:A/O 2 (does not know when/why), follows commands, NAD,  No acute respiratory distress Lungs: Clear to auscultation bilaterally without wheezes or crackles Cardiovascular: Regular rate and rhythm without murmur gallop or rub normal S1 and S2 Abdomen: Nontender, nondistended, soft, bowel sounds positive, no rebound, no ascites, no appreciable mass Extremities: No significant cyanosis, clubbing, or edema bilateral lower extremities Musculoskeletal; pain to palpation C6-T1 Neurologic; cranial nerves II through XII intact, tongue/uvula midline, pupils equal round reactive to light and accommodation, patient moves all extremities to commands (bilateral lower extremity strength 4/5), sensation intact throughout  Data Reviewed: Basic Metabolic Panel:  Recent Labs Lab 04/17/14 0155 04/17/14 1036 04/18/14 0024  NA 134* 135* 129*  K 4.2 4.0 4.0  CL 96 97 93*  CO2 23 22 22   GLUCOSE 268* 155* 169*  BUN 12 11 11   CREATININE 0.78 0.65 0.66  CALCIUM 8.9 8.6 8.4  MG  --  1.1*  --   PHOS  --  3.0  --    Liver Function Tests:  Recent Labs Lab 04/17/14 0155 04/17/14 1036  AST 26 20  ALT 22 19  ALKPHOS 54 55  BILITOT 0.3 0.6  PROT 6.6 6.5  ALBUMIN 3.5 3.4*   No results for input(s): LIPASE, AMYLASE in the last 168 hours. No results for input(s): AMMONIA in the last 168 hours. CBC:  Recent Labs Lab 04/17/14 0155 04/17/14 1036  WBC 8.7 7.7  NEUTROABS 6.4  --   HGB 11.8* 11.9*  HCT 34.7* 34.8*  MCV 96.7 98.6  PLT 200 176   Cardiac Enzymes:  Recent Labs Lab 04/17/14 0155 04/17/14 0810 04/17/14 1430  TROPONINI <0.30 <0.30 <0.30   BNP (last 3 results) No results for input(s): PROBNP in the last 8760 hours. CBG:  Recent Labs Lab 04/17/14 1637 04/17/14 2137 04/18/14 0745 04/18/14 1107 04/18/14 1616  GLUCAP 193* 254* 227* 330* 271*    Recent Results (from the  past 240 hour(s))  Culture, Urine     Status: None   Collection Time: 04/17/14  2:26 AM  Result Value Ref Range Status   Specimen Description URINE, CLEAN CATCH  Final   Special Requests NONE  Final   Culture  Setup Time   Final    04/17/2014 10:00 Performed at [place]   Final    75,000 COLONIES/ML Performed at [facility]    Culture   Final    GROUP B STREP(S.AGALACTIAE)ISOLATED Note: TESTING AGAINST S. AGALACTIAE NOT ROUTINELY PERFORMED DUE TO PREDICTABILITY OF AMP/PEN/VAN SUSCEPTIBILITY. Performed at [facility]    Report Status 04/18/2014 FINAL  Final  MRSA PCR Screening     Status: None   Collection Time: 04/17/14 12:06 PM  Result Value Ref Range Status   MRSA by PCR NEGATIVE NEGATIVE Final    Comment:        The GeneXpert MRSA Assay (FDA approved for NASAL specimens only), is one component of a comprehensive MRSA colonization surveillance program. It is not intended to diagnose MRSA infection nor to guide or monitor treatment for MRSA infections.      Studies:  Recent x-ray studies have been reviewed in detail by the Attending Physician  Scheduled Meds:  Scheduled Meds: . amLODipine  5 mg Oral Daily  . calcium carbonate  1.5 tablet Oral BID  . cefTRIAXone (ROCEPHIN)  IV  1 g Intravenous Q24H  . cholecalciferol  1,000 Units Oral QHS  . docusate sodium  100 mg Oral BID  . polyethylene glycol powder  17 g Oral Daily  . ramipril  10 mg Oral Daily  . sodium chloride  3 mL Intravenous Q12H  . vitamin B-12  1,000 mcg Oral QODAY    Time spent on care of this patient: 40 mins   Allie Bossier , MD   [facility] Office  [phone] Pager - [phone]  On-Call/Text Page:      [email or web address]      password [email or web address]  If 7PM-7AM, please contact night-coverage [email or web address] Password [email or web address] 04/18/2014, 7:11 PM   LOS: 2 days

## 2014-04-18 NOTE — Progress Notes (Signed)
Inpatient Diabetes Program Recommendations  AACE/ADA: New Consensus Statement on Inpatient Glycemic Control (2013)  Target Ranges:  Prepandial:   less than 140 mg/dL      Peak postprandial:   less than 180 mg/dL (1-2 hours)      Critically ill patients:  140 - 180 mg/dL   Pt noted with history of DM 2, on metformin 1000 mg bid.at home. While here, please consider addition of sensitive correction tidwc per below.  Inpatient Diabetes Program Recommendations Correction (SSI): Please consider adding a sensitive correction scale tidwc, as glucoe running primarily in 200's.   Thank you, Rosita Kea, RN, CNS, Diabetes Coordinator [phone])

## 2014-04-19 ENCOUNTER — Encounter (HOSPITAL_COMMUNITY): Payer: Self-pay | Admitting: *Deleted

## 2014-04-19 ENCOUNTER — Inpatient Hospital Stay (HOSPITAL_COMMUNITY): Payer: Medicare Other

## 2014-04-19 LAB — GLUCOSE, CAPILLARY
Glucose-Capillary: 209 mg/dL — ABNORMAL HIGH (ref 70–99)
Glucose-Capillary: 210 mg/dL — ABNORMAL HIGH (ref 70–99)
Glucose-Capillary: 213 mg/dL — ABNORMAL HIGH (ref 70–99)
Glucose-Capillary: 246 mg/dL — ABNORMAL HIGH (ref 70–99)
Glucose-Capillary: 247 mg/dL — ABNORMAL HIGH (ref 70–99)
Glucose-Capillary: 250 mg/dL — ABNORMAL HIGH (ref 70–99)

## 2014-04-19 LAB — CBC WITH DIFFERENTIAL/PLATELET
Basophils Absolute: 0 10*3/uL (ref 0.0–0.1)
Basophils Relative: 0 % (ref 0–1)
Eosinophils Absolute: 0 10*3/uL (ref 0.0–0.7)
Eosinophils Relative: 0 % (ref 0–5)
HCT: 35.1 % — ABNORMAL LOW (ref 36.0–46.0)
Hemoglobin: 12 g/dL (ref 12.0–15.0)
Lymphocytes Relative: 8 % — ABNORMAL LOW (ref 12–46)
Lymphs Abs: 0.9 10*3/uL (ref 0.7–4.0)
MCH: 32.8 pg (ref 26.0–34.0)
MCHC: 34.2 g/dL (ref 30.0–36.0)
MCV: 95.9 fL (ref 78.0–100.0)
Monocytes Absolute: 0.8 10*3/uL (ref 0.1–1.0)
Monocytes Relative: 7 % (ref 3–12)
NEUTROS PCT: 85 % — AB (ref 43–77)
Neutro Abs: 10.1 10*3/uL — ABNORMAL HIGH (ref 1.7–7.7)
PLATELETS: 226 10*3/uL (ref 150–400)
RBC: 3.66 MIL/uL — AB (ref 3.87–5.11)
RDW: 13.3 % (ref 11.5–15.5)
WBC: 11.9 10*3/uL — ABNORMAL HIGH (ref 4.0–10.5)

## 2014-04-19 LAB — BASIC METABOLIC PANEL
Anion gap: 17 — ABNORMAL HIGH (ref 5–15)
BUN: 18 mg/dL (ref 6–23)
CHLORIDE: 88 meq/L — AB (ref 96–112)
CO2: 21 mEq/L (ref 19–32)
Calcium: 8.9 mg/dL (ref 8.4–10.5)
Creatinine, Ser: 0.64 mg/dL (ref 0.50–1.10)
GFR calc Af Amer: >90 mL/min (ref >90)
GFR calc non Af Amer: 81 mL/min — ABNORMAL LOW (ref >90)
GLUCOSE: 210 mg/dL — AB (ref 70–99)
POTASSIUM: 4.1 meq/L (ref 3.7–5.3)
Sodium: 126 mEq/L — ABNORMAL LOW (ref 137–147)

## 2014-04-19 LAB — COMPREHENSIVE METABOLIC PANEL
ALK PHOS: 61 U/L (ref 39–117)
ALT: 17 U/L (ref 0–35)
AST: 15 U/L (ref 0–37)
Albumin: 3.4 g/dL — ABNORMAL LOW (ref 3.5–5.2)
Anion gap: 15 (ref 5–15)
BILIRUBIN TOTAL: 0.6 mg/dL (ref 0.3–1.2)
BUN: 13 mg/dL (ref 6–23)
CHLORIDE: 91 meq/L — AB (ref 96–112)
CO2: 22 meq/L (ref 19–32)
Calcium: 8.8 mg/dL (ref 8.4–10.5)
Creatinine, Ser: 0.62 mg/dL (ref 0.50–1.10)
GFR, EST NON AFRICAN AMERICAN: 82 mL/min — AB (ref >90)
GLUCOSE: 196 mg/dL — AB (ref 70–99)
Potassium: 4.1 mEq/L (ref 3.7–5.3)
SODIUM: 128 meq/L — AB (ref 137–147)
Total Protein: 6.9 g/dL (ref 6.0–8.3)

## 2014-04-19 LAB — MAGNESIUM: MAGNESIUM: 1.4 mg/dL — AB (ref 1.5–2.5)

## 2014-04-19 LAB — URIC ACID: Uric Acid, Serum: 3.1 mg/dL (ref 2.4–7.0)

## 2014-04-19 LAB — LIPID PANEL
CHOL/HDL RATIO: 3.2 ratio
CHOLESTEROL: 122 mg/dL (ref 0–200)
HDL: 38 mg/dL — AB (ref >39)
LDL CALC: 68 mg/dL (ref 0–99)
TRIGLYCERIDES: 82 mg/dL (ref <150)
VLDL: 16 mg/dL (ref 0–40)

## 2014-04-19 LAB — OSMOLALITY: Osmolality: 278 mOsm/kg (ref 275–300)

## 2014-04-19 MED ORDER — LABETALOL HCL 5 MG/ML IV SOLN: 5.0000 mg | INTRAVENOUS | Status: DC

## 2014-04-19 MED ORDER — MORPHINE SULFATE 2 MG/ML IJ SOLN: 2.0000 mg | INTRAMUSCULAR | Status: DC | PRN

## 2014-04-19 MED ORDER — POLYETHYLENE GLYCOL 3350 17 G PO PACK
17.0000 g | PACK | Freq: Every day | ORAL | Status: DC
  Administered 2014-04-19: 17 g via ORAL
  Filled 2014-04-19: qty 1

## 2014-04-19 MED ORDER — HYDRALAZINE HCL 20 MG/ML IJ SOLN: 10.0000 mg | INTRAMUSCULAR | Status: DC | PRN

## 2014-04-19 MED ORDER — LABETALOL HCL 5 MG/ML IV SOLN
5.0000 mg | INTRAVENOUS | Status: DC
Start: 2014-04-19 — End: 2014-04-20

## 2014-04-19 NOTE — Progress Notes (Signed)
Patient ID: Diane Stevenson, female   DOB: [birth year], 80 y.o.   MRN: [id number] Vital signs are stable. Patient has increasing lethargy. CT scan done last night demonstrates increase in size of interhemispheric subdural hematoma now 16 mm. Convexity subdural is also slightly larger. There is slight right to left shift.  Though the subdural is slightly larger is still out of the realm of surgical intervention. I discussed the situation with the patient's husband. Note that she is more lethargic, she is complaining also of some neck pain. Nonetheless surgical intervention is not likely to yield any clinical improvement and could lead to further neurologic deterioration. My advice is to continue to treat this conservatively.  The MRI of the thoracic spine was also reviewed with a patient's husband. I note that plain x-rays and CT scanning suggested 3 fractures T1 T6 and T10 above her kyphoplasty's at T11-T12. Is unsure whether these were acute subacute or chronic healed fractures. As it turns out by the MRI the T10 fracture is old and healed. Is no evidence that this is unstable. There is an area of stenosis at T10-T11. It is not critical. The T6 and T1 fractures are acute. These are minimal fractures and should be treated conservatively. I do not believe any special bracing is required. Mobilization is certainly permitted.  The patient will be followed by the on-call neurosurgeons in my absence over this long weekend. Her clinical course at this time will be uncertain however I believe that conservative management of this interhemispheric subdural is still patient's best interest lest her condition deteriorate further.

## 2014-04-19 NOTE — Progress Notes (Signed)
Medicare Important Message given? YES  (If response is "NO", the following Medicare IM given date fields will be blank)  Date Medicare IM given: 04/19/14 Medicare IM given by:  Edwina Grossberg  

## 2014-04-19 NOTE — Progress Notes (Addendum)
[place] TEAM 1 - Stepdown/ICU TEAM Progress Note  NOHELANI BENNING AJO:[id number] DOB: 08-23-1929 DOA: 04/16/2014 PCP: Irven Shelling, MD  Admit HPI / Brief Narrative: 80 yo female w/ a history of breast cancer (08/04/1995); hypertension; DM; GERD; and DJD who presented after a loss of balance and a fall backwards, during which she hit the back of her head on the floor and the wall. Patient did not loose consciousness. Patient was brought to [facility] ER and CT showed acute subdural hematoma extending along the entirety of the right interhemispheric fissure and right tentorium as well as an acute compression fracture of the T1 vertebral body with no retropulsion. Also noted was a shallow subdural hematoma along the right frontal lobe. The case was discussed at length with Dr. Ellene Route with Neurosurgery who believed the patient would not require operative intervention.  HPI/Subjective: Pt experienced an acute change in her mental status late last night, prompting a repeat CT head which noted expanding SDH.  Th/o the day she has been quite lethargic, but has responded to short questions or stimulation.  The RN has contacted me now at 5:50PM noting that her MS has again declined, and that she is no longer following commands.  I have requested another STAT CT head.   Assessment/Plan:  Interhemispheric subdural hematoma Stable on f/u CT initially, but repeat CT 11/24 PM noted progression of falcine SDH with slight R>L shift, coincident w/ MS changes - see note per Dr. Ellene Route - in short surgery not indicated at this time - NS continues to follow w/ [place]   Acute T1 Compression fx Medical management - PT/OT consult  Nondisplaced right 11th rib fracture Medical management only   Multiple subacute spinal fractures consistent with frequent falls (T1 T6 and T10) Dependent upon PT/OT evaluation patient may require SNF or assisted living  Group B Strep UTI Cont abx tx for prolonged tx course   HTN BP has  been elevated today, likely due to pain (has been c/o HA) - adjsut tx and follow, but avoid overaggressive correction at this time   Diabetes A1c 6.6 - CBG uncontrolled - adjsut tx and follow trend   Hyponatremia Review of old records reveals this is a chronic problem, w/ baseline Na ~130 - current trend is downward - this is problematic in the setting of ICH - urine studies ordered but not collected - cont to follow trend - recheck Na in setting of declining mental status   Code Status: FULL Family Communication: spoke w/ daughter at bedside at length  Disposition Plan: SDU  Consultants: NS  Procedures: none  Antibiotics: Rocephin 11/23 >  DVT prophylaxis: SCDs only   Objective: Blood pressure 149/70, pulse 100, temperature 98.5 F (36.9 C), temperature source Oral, resp. rate 20, height 5\' 5"  (1.651 m), weight 66.2 kg (145 lb 15.1 oz), SpO2 96 %.  Intake/Output Summary (Last 24 hours) at 04/19/14 1131 Last data filed at 04/19/14 0800  Gross per 24 hour  Intake      0 ml  Output      4 ml  Net     -4 ml   Exam: General: No acute respiratory distress Lungs: Clear to auscultation bilaterally without wheezes or crackles Cardiovascular: Regular rate and rhythm without murmur gallop or rub normal S1 and S2 Abdomen: Nontender, nondistended, soft, bowel sounds positive, no rebound, no ascites, no appreciable mass Extremities: No significant cyanosis, clubbing, or edema bilateral lower extremities Neuro:  Able to answer simple questions - keeps eyes  closed - moves all 4 ext - no facial assymetry   Data Reviewed: Basic Metabolic Panel:  Recent Labs Lab 04/17/14 0155 04/17/14 1036 04/18/14 0024 04/19/14 0321  NA 134* 135* 129* 128*  K 4.2 4.0 4.0 4.1  CL 96 97 93* 91*  CO2 23 22 22 22   GLUCOSE 268* 155* 169* 196*  BUN 12 11 11 13   CREATININE 0.78 0.65 0.66 0.62  CALCIUM 8.9 8.6 8.4 8.8  MG  --  1.1*  --  1.4*  PHOS  --  3.0  --   --     Liver Function  Tests:  Recent Labs Lab 04/17/14 0155 04/17/14 1036 04/19/14 0321  AST 26 20 15   ALT 22 19 17   ALKPHOS 54 55 61  BILITOT 0.3 0.6 0.6  PROT 6.6 6.5 6.9  ALBUMIN 3.5 3.4* 3.4*    Coags:  Recent Labs Lab 04/17/14 0155  INR 1.16    Recent Labs Lab 04/17/14 0155  APTT 28    CBC:  Recent Labs Lab 04/17/14 0155 04/17/14 1036 04/19/14 0321  WBC 8.7 7.7 11.9*  NEUTROABS 6.4  --  10.1*  HGB 11.8* 11.9* 12.0  HCT 34.7* 34.8* 35.1*  MCV 96.7 98.6 95.9  PLT 200 176 226    Cardiac Enzymes:  Recent Labs Lab 04/17/14 0155 04/17/14 0810 04/17/14 1430  TROPONINI <0.30 <0.30 <0.30   CBG:  Recent Labs Lab 04/18/14 1616 04/18/14 2053 04/18/14 2350 04/19/14 0332 04/19/14 0747  GLUCAP 271* 220* 250* 210* 247*    Recent Results (from the past 240 hour(s))  Culture, Urine     Status: None   Collection Time: 04/17/14  2:26 AM  Result Value Ref Range Status   Specimen Description URINE, CLEAN CATCH  Final   Special Requests NONE  Final   Culture  Setup Time   Final    04/17/2014 10:00 Performed at [facility]   Final    75,000 COLONIES/ML Performed at [facility]    Culture   Final    GROUP B STREP(S.AGALACTIAE)ISOLATED Note: TESTING AGAINST S. AGALACTIAE NOT ROUTINELY PERFORMED DUE TO PREDICTABILITY OF AMP/PEN/VAN SUSCEPTIBILITY. Performed at [facility]    Report Status 04/18/2014 FINAL  Final  MRSA PCR Screening     Status: None   Collection Time: 04/17/14 12:06 PM  Result Value Ref Range Status   MRSA by PCR NEGATIVE NEGATIVE Final    Comment:        The GeneXpert MRSA Assay (FDA approved for NASAL specimens only), is one component of a comprehensive MRSA colonization surveillance program. It is not intended to diagnose MRSA infection nor to guide or monitor treatment for MRSA infections.      Studies:  Recent x-ray studies have been reviewed in detail by the Attending Physician  Scheduled  Meds:  Scheduled Meds: . amLODipine  10 mg Oral Daily  . amLODipine  5 mg Oral Once  . calcium carbonate  1.5 tablet Oral BID  . cefTRIAXone (ROCEPHIN)  IV  1 g Intravenous Q24H  . cholecalciferol  1,000 Units Oral QHS  . docusate sodium  100 mg Oral BID  . insulin aspart  0-15 Units Subcutaneous 6 times per day  . polyethylene glycol  17 g Oral Daily  . ramipril  10 mg Oral Daily  . sodium chloride  3 mL Intravenous Q12H  . vitamin B-12  1,000 mcg Oral QODAY    Time spent on care of this  patient: 35 mins   Jaquesha Boroff T , MD   [facility] Office  [phone] Pager - Text Page per Shea Evans as per below:  On-Call/Text Page:      [email or web address]      password [email or web address]  If 7PM-7AM, please contact night-coverage [email or web address] Password [email or web address] 04/19/2014, 11:31 AM   LOS: 3 days

## 2014-04-19 NOTE — Progress Notes (Signed)
OT Cancellation Note  Patient Details Name: Diane Stevenson MRN: [id number] DOB: Sep 07, 1929   Cancelled Treatment:    Reason Eval/Treat Not Completed: Patient not medically ready (acute thoracic compression fxs.)  Peri Maris  Pager: [phone]  04/19/2014, 9:47 AM

## 2014-04-19 NOTE — Progress Notes (Signed)
Inpatient Diabetes Program Recommendations  AACE/ADA: New Consensus Statement on Inpatient Glycemic Control (2013)  Target Ranges:  Prepandial:   less than 140 mg/dL      Peak postprandial:   less than 180 mg/dL (1-2 hours)      Critically ill patients:  140 - 180 mg/dL   Results for TAMRA, KOOS (MRN [id number]) as of 04/19/2014 08:26  Ref. Range 04/18/2014 07:45 04/18/2014 11:07 04/18/2014 16:16 04/18/2014 20:53 04/18/2014 23:50 04/19/2014 03:32  Glucose-Capillary Latest Range: 70-99 mg/dL 227 (H) 330 (H) 271 (H) 220 (H) 250 (H) 210 (H)   Diabetes history: DM2 Outpatient Diabetes medications: Metformin 1000 mg BID Current orders for Inpatient glycemic control: Novolog 0-15 units Q4H  Inpatient Diabetes Program Recommendations Insulin - Basal: While inpatient, please consider ordering low dose basal insulin. Recommend starting with Levemir 10 units daily to start now.  Thanks, Barnie Alderman, RN, MSN, CCRN, CDE Diabetes Coordinator Inpatient Diabetes Program [phone] (Team Pager) [phone] ([facility] office) [phone] [facility] office)

## 2014-04-19 NOTE — Progress Notes (Signed)
Nutrition Brief Note  Chart reviewed due to low Braden score.   Wt Readings from Last 15 Encounters:  04/17/14 145 lb 15.1 oz (66.2 kg)  08/04/13 133 lb (60.328 kg)  07/22/13 137 lb 3.2 oz (62.234 kg)  06/15/13 137 lb (62.143 kg)  06/09/13 138 lb (62.596 kg)  01/26/13 147 lb 6 oz (66.849 kg)  08/13/11 149 lb 14.6 oz (68 kg)  07/23/11 153 lb 3.2 oz (69.491 kg)    Body mass index is 24.29 kg/(m^2). Patient meets criteria for normal weight based on current BMI.   Current diet order is CHO-modified, patient is consuming approximately 75% of meals at this time. Labs and medications reviewed. No weight loss PTA. No skin breakdown noted. PO intake is good.  No nutrition interventions warranted at this time. If nutrition issues arise, please consult RD.   Molli Barrows, RD, LDN, [place] Pager [phone] After Hours Pager [phone]

## 2014-04-19 NOTE — Progress Notes (Signed)
PT Cancellation Note  Patient Details Name: Diane Stevenson MRN: [id number] DOB: June 17, 1929   Cancelled Treatment:    Reason Eval/Treat Not Completed: Patient not medically ready.  Spoke with RN who indicates pt with change in neuro status and with acute thoracic compression fxs.  Will hold PT at this time and need clarification when pt becomes appropriate for mobility and PT.     Catarina Hartshorn, [place] 04/19/2014, 9:26 AM

## 2014-04-20 ENCOUNTER — Inpatient Hospital Stay (HOSPITAL_COMMUNITY): Payer: Medicare Other

## 2014-04-20 DIAGNOSIS — S22010K Wedge compression fracture of first thoracic vertebra, subsequent encounter for fracture with nonunion: Secondary | ICD-10-CM

## 2014-04-20 DIAGNOSIS — R569 Unspecified convulsions: Secondary | ICD-10-CM | POA: Diagnosis present

## 2014-04-20 DIAGNOSIS — S2231XK Fracture of one rib, right side, subsequent encounter for fracture with nonunion: Secondary | ICD-10-CM

## 2014-04-20 LAB — GLUCOSE, CAPILLARY
GLUCOSE-CAPILLARY: 150 mg/dL — AB (ref 70–99)
GLUCOSE-CAPILLARY: 160 mg/dL — AB (ref 70–99)
GLUCOSE-CAPILLARY: 196 mg/dL — AB (ref 70–99)
GLUCOSE-CAPILLARY: 204 mg/dL — AB (ref 70–99)
Glucose-Capillary: 188 mg/dL — ABNORMAL HIGH (ref 70–99)
Glucose-Capillary: 159 mg/dL — ABNORMAL HIGH (ref 70–99)

## 2014-04-20 LAB — SODIUM, URINE, RANDOM: Sodium, Ur: 88 mEq/L

## 2014-04-20 LAB — COMPREHENSIVE METABOLIC PANEL
ALBUMIN: 3.2 g/dL — AB (ref 3.5–5.2)
ALK PHOS: 60 U/L (ref 39–117)
ALT: 15 U/L (ref 0–35)
AST: 17 U/L (ref 0–37)
Anion gap: 16 — ABNORMAL HIGH (ref 5–15)
BUN: 19 mg/dL (ref 6–23)
CHLORIDE: 93 meq/L — AB (ref 96–112)
CO2: 21 mEq/L (ref 19–32)
Calcium: 8.9 mg/dL (ref 8.4–10.5)
Creatinine, Ser: 0.62 mg/dL (ref 0.50–1.10)
GFR calc non Af Amer: 82 mL/min — ABNORMAL LOW (ref >90)
GLUCOSE: 212 mg/dL — AB (ref 70–99)
POTASSIUM: 4.2 meq/L (ref 3.7–5.3)
Sodium: 130 mEq/L — ABNORMAL LOW (ref 137–147)
Total Bilirubin: 0.5 mg/dL (ref 0.3–1.2)
Total Protein: 6.9 g/dL (ref 6.0–8.3)

## 2014-04-20 LAB — CBC
HEMATOCRIT: 33.7 % — AB (ref 36.0–46.0)
Hemoglobin: 11.5 g/dL — ABNORMAL LOW (ref 12.0–15.0)
MCH: 32.6 pg (ref 26.0–34.0)
MCHC: 34.1 g/dL (ref 30.0–36.0)
MCV: 95.5 fL (ref 78.0–100.0)
Platelets: 209 10*3/uL (ref 150–400)
RBC: 3.53 MIL/uL — ABNORMAL LOW (ref 3.87–5.11)
RDW: 13.5 % (ref 11.5–15.5)
WBC: 8 10*3/uL (ref 4.0–10.5)

## 2014-04-20 LAB — OSMOLALITY: OSMOLALITY: 284 mosm/kg (ref 275–300)

## 2014-04-20 LAB — URIC ACID: Uric Acid, Serum: 3.4 mg/dL (ref 2.4–7.0)

## 2014-04-20 MED ORDER — FOLIC ACID 5 MG/ML IJ SOLN
1.0000 mg | Freq: Every day | INTRAMUSCULAR | Status: DC
  Administered 2014-04-20 – 2014-04-30 (×11): 1 mg via INTRAVENOUS
  Filled 2014-04-20 (×12): qty 0.2

## 2014-04-20 MED ORDER — THIAMINE HCL 100 MG/ML IJ SOLN
100.0000 mg | Freq: Every day | INTRAMUSCULAR | Status: DC
Start: 2014-04-20 — End: 2014-05-01
  Administered 2014-04-20 – 2014-04-30 (×11): 100 mg via INTRAVENOUS
  Filled 2014-04-20 (×12): qty 1

## 2014-04-20 MED ORDER — LEVETIRACETAM IN NACL 1000 MG/100ML IV SOLN
1000.0000 mg | INTRAVENOUS | Status: AC
  Administered 2014-04-20: 1000 mg via INTRAVENOUS
  Filled 2014-04-20: qty 100

## 2014-04-20 MED ORDER — LEVETIRACETAM IN NACL 500 MG/100ML IV SOLN
500.0000 mg | Freq: Two times a day (BID) | INTRAVENOUS | Status: DC
  Administered 2014-04-21 (×2): 500 mg via INTRAVENOUS
  Filled 2014-04-20 (×3): qty 100

## 2014-04-20 MED ORDER — LEVETIRACETAM IN NACL 500 MG/100ML IV SOLN
500.0000 mg | Freq: Once | INTRAVENOUS | Status: AC
  Administered 2014-04-20: 500 mg via INTRAVENOUS
  Filled 2014-04-20: qty 100

## 2014-04-20 MED ORDER — SODIUM CHLORIDE 0.9 % IV SOLN
INTRAVENOUS | Status: DC
  Administered 2014-04-20 – 2014-04-21 (×2): via INTRAVENOUS
  Administered 2014-04-23: 1000 mL via INTRAVENOUS
  Administered 2014-04-23 – 2014-05-03 (×3): via INTRAVENOUS

## 2014-04-20 MED ORDER — LORAZEPAM 2 MG/ML IJ SOLN
1.0000 mg | INTRAMUSCULAR | Status: DC | PRN
  Administered 2014-04-20 – 2014-04-21 (×2): 1 mg via INTRAVENOUS
  Filled 2014-04-20 (×2): qty 1

## 2014-04-20 MED ORDER — INSULIN ASPART 100 UNIT/ML ~~LOC~~ SOLN
0.0000 [IU] | SUBCUTANEOUS | Status: DC
  Administered 2014-04-20: 3 [IU] via SUBCUTANEOUS
  Administered 2014-04-20 – 2014-04-21 (×2): 4 [IU] via SUBCUTANEOUS
  Administered 2014-04-21: 3 [IU] via SUBCUTANEOUS
  Administered 2014-04-21: 4 [IU] via SUBCUTANEOUS
  Administered 2014-04-22 (×2): 3 [IU] via SUBCUTANEOUS

## 2014-04-20 MED ORDER — LABETALOL HCL 5 MG/ML IV SOLN
5.0000 mg | INTRAVENOUS | Status: DC
  Administered 2014-04-22 – 2014-04-24 (×7): 5 mg via INTRAVENOUS
  Filled 2014-04-20 (×30): qty 4

## 2014-04-20 NOTE — Progress Notes (Signed)
Pt family stated pt has had 2 similar episodes lasting 2-5 mins starting after 10am. RN aware after 2 episode at the start of 3rd episode.

## 2014-04-20 NOTE — Progress Notes (Signed)
On assessment, Ms. Petrucci wiggled toes on both feet when her soles were tickled.  Moved her left arm across the bed while I was starting a second IV line and weakly squeezed my fingers on command with her right hand.  When asked how old she was, she whispered "89" several times.  Her pupils were assessed in a dimly lit room with a flashlight.  They were both briskly reactive to light. No additional seizure activity noted at this time.

## 2014-04-20 NOTE — Progress Notes (Signed)
Pt opens eyes to stimuli, some verbaliztion, moves extremities, follows commands. Following. Spoke with her daughter.

## 2014-04-20 NOTE — Progress Notes (Signed)
Diane Stevenson TEAM 1 - Stepdown/ICU TEAM Progress Note  Diane Stevenson ZOX:[id number] DOB: September 02, 1929 DOA: 04/16/2014 PCP: Diane Shelling, MD  Admit HPI / Brief Narrative: 80 y.o. WF PMHx Breast cancer (08/04/1995); Hypertension; Diabetes mellitus; GERD; DJD, frequent falls Presented after a loss of balance and a fall backwards, during which she hit the back ofher head on the floor and the wall. Patient did not loose consciousness. Patient was brought to [facility] ER and CT showed acute subdural hematoma extending along the entirety of the right interhemispheric fissure and right tentorium as well as an acute compression fracture of the T1 vertebral body with no retropulsion. Also noted was a shallow subdural hematoma along the right frontal lobe. The case was discussed at length with Dr. Ellene Route with Neurosurgery who believed the patient would not require operative intervention.  HPI/Subjective: 11/26 page by Diane Stevenson   the patient had become more obtunded overnight, and has had 2 witnessed seizures this a.m. upon arrival to bedside A/O 0 (patient just received Ativan). However daughter Diane Stevenson states that earlier this a.m. patient was appropriately communicative with her.    Assessment/Plan: Interhemispheric subdural hematoma -Stable on f/u CT -MRI brain pending.   Acute encephalopathy -Possibly multifactorial to include extension of subdural hematoma/CVA  Seizures -Patient has had 2 episodes of witnessed seizures by family and RN Diane Stevenson  -Seizure precautions -Keppra 1000 mg IV 1 -Ativan 1-2 mg IV q 2hr PRN seizure -Thiamine 811 mg daily -Folic acid 1 mg daily -Neurology consult   Acute T1 Compression fx -Medical management; and control -11/26 PT/OT consulted on 11/24 but unable to complete evaluation secondary to continued neurologic status.  Nondisplaced right 11th rib fracture -Medical management  Multiple subacute fractures consistent with frequent falls -Dependent upon  PT/OT evaluation patient may require SNF or assisted living  UTI (group B strep) -Continue ceftriaxone,    HTN -Continue labetalol IV 5 mg -Goal SBP 140- 160  Diabetes controlled -11/23 hemoglobin A1c= 6.6 -Current CBG running high, increased to resistant SSI -Obtain lipid panel  Hyponatremia -Most likely secondary to SDH (SIADH?)  -Awaiting collection of appropriate labs (Obtain urine osmolality, urine sodium, serum uric acid, serum osmolality). -Sodium at 130, will continue Fluid restriction 1000 ml/day in the face of altered mental status and new onset seizures ,     Code Status: FULL Family Communication: Daughter present at time of exam Disposition Plan: Per neurosurgery   Consultants: Dr. Kristeen Stevenson (neurosurgery) Dr. Dorian Stevenson (neurology)    Procedure/Significant Events: 11/22 PCXR;. Asymmetric degenerative left glenohumeral arthropathy is chronic -Chronic deformity of the left distal clavicle related to the fracture earlier this year. -Lower thoracic vertebral augmentation at T11.- Inferior endplate compression fracture at T10 11/22 pelvis x-ray; Callus formation compatible with healed fractures of the right pubic rami and right sacrum. -osteoporosis. 11/22 T-spine x-ray; Mild compression deformities at T6 and T11; new?  11/22 CT C-spine without contrast/head CT; Acute subdural hematoma extending along right interhemispheric fissure and right tentorium. - Shallow subdural hematoma along the right frontal lobe. - No intraventricular hemorrhage, hydrocephalus, or mass effect. - Acute compression fracture the T1 vertebral body with no retropulsion. ~ 40% loss of vertebral body height anteriorly. 11/23 CT T-spine without contrast;Acute T1 and T6 compression fractures.  -Probably subacute T10 compression fracture; suggestion acute nondisplaced bilateral T10 pars fractures.- Multifactorial T10-T11 spinal stenosis.- Nondisplaced right eleventh rib  fracture. -Chronic T11 and T12 compression fracture status post augmentation.     Culture 11/23 urine positive group B strep (  strep Agalactiae)    Antibiotics: Ceftriaxone 11/23 >>   DVT prophylaxis: SCD  Devices   LINES / TUBES:      Continuous Infusions:   Objective: VITAL SIGNS: Temp: 99.4 F (37.4 C) (11/26 1100) Temp Source: Oral (11/26 1100) BP: 144/69 mmHg (11/26 1300) Pulse Rate: 94 (11/26 1300) SPO2; FIO2:   Intake/Output Summary (Last 24 hours) at 04/20/14 1413 Last data filed at 04/19/14 2203  Gross per 24 hour  Intake      0 ml  Output      2 ml  Net     -2 ml     Exam: General:A/O  0 (just received Ativan), daughter verifies that earlier prior to seizure patient was interacting appropriately,NAD,  No acute respiratory distress Lungs: Clear to auscultation bilaterally without wheezes or crackles Cardiovascular: Regular rate and rhythm without murmur gallop or rub normal S1 and S2 Abdomen: Nontender, nondistended, soft, bowel sounds positive, no rebound, no ascites, no appreciable mass Extremities: No significant cyanosis, clubbing, or edema bilateral lower extremities Musculoskeletal; pain to palpation C6-T1 Neurologic; unable to evaluate secondary to medication   Data Reviewed: Basic Metabolic Panel:  Recent Labs Lab 04/17/14 1036 04/18/14 0024 04/19/14 0321 04/19/14 2010 04/20/14 0235  NA 135* 129* 128* 126* 130*  K 4.0 4.0 4.1 4.1 4.2  CL 97 93* 91* 88* 93*  CO2 22 22 22 21 21   GLUCOSE 155* 169* 196* 210* 212*  BUN 11 11 13 18 19   CREATININE 0.65 0.66 0.62 0.64 0.62  CALCIUM 8.6 8.4 8.8 8.9 8.9  MG 1.1*  --  1.4*  --   --   PHOS 3.0  --   --   --   --    Liver Function Tests:  Recent Labs Lab 04/17/14 0155 04/17/14 1036 04/19/14 0321 04/20/14 0235  AST 26 20 15 17   ALT 22 19 17 15   ALKPHOS 54 55 61 60  BILITOT 0.3 0.6 0.6 0.5  PROT 6.6 6.5 6.9 6.9  ALBUMIN 3.5 3.4* 3.4* 3.2*   No results for input(s): LIPASE,  AMYLASE in the last 168 hours. No results for input(s): AMMONIA in the last 168 hours. CBC:  Recent Labs Lab 04/17/14 0155 04/17/14 1036 04/19/14 0321 04/20/14 0235  WBC 8.7 7.7 11.9* 8.0  NEUTROABS 6.4  --  10.1*  --   HGB 11.8* 11.9* 12.0 11.5*  HCT 34.7* 34.8* 35.1* 33.7*  MCV 96.7 98.6 95.9 95.5  PLT 200 176 226 209   Cardiac Enzymes:  Recent Labs Lab 04/17/14 0155 04/17/14 0810 04/17/14 1430  TROPONINI <0.30 <0.30 <0.30   BNP (last 3 results) No results for input(s): PROBNP in the last 8760 hours. CBG:  Recent Labs Lab 04/19/14 1954 04/20/14 0005 04/20/14 0410 04/20/14 0803 04/20/14 1118  GLUCAP 213* 204* 188* 160* 196*    Recent Results (from the past 240 hour(s))  Culture, Urine     Status: None   Collection Time: 04/17/14  2:26 AM  Result Value Ref Range Status   Specimen Description URINE, CLEAN CATCH  Final   Special Requests NONE  Final   Culture  Setup Time   Final    04/17/2014 10:00 Performed at [place]   Final    75,000 COLONIES/ML Performed at [facility]    Culture   Final    GROUP B STREP(S.AGALACTIAE)ISOLATED Note: TESTING AGAINST S. AGALACTIAE NOT ROUTINELY PERFORMED DUE TO PREDICTABILITY OF AMP/PEN/VAN SUSCEPTIBILITY. Performed at [facility]  Report Status 04/18/2014 FINAL  Final  MRSA PCR Screening     Status: None   Collection Time: 04/17/14 12:06 PM  Result Value Ref Range Status   MRSA by PCR NEGATIVE NEGATIVE Final    Comment:        The GeneXpert MRSA Assay (FDA approved for NASAL specimens only), is one component of a comprehensive MRSA colonization surveillance program. It is not intended to diagnose MRSA infection nor to guide or monitor treatment for MRSA infections.      Studies:  Recent x-ray studies have been reviewed in detail by the Attending Physician  Scheduled Meds:  Scheduled Meds: . cefTRIAXone (ROCEPHIN)  IV  1 g Intravenous Q24H  . folic  acid  1 mg Intravenous Daily  . insulin aspart  0-20 Units Subcutaneous 6 times per day  . labetalol  5 mg Intravenous Q4H  . levETIRAcetam  1,000 mg Intravenous STAT  . [START ON 04/21/2014] levETIRAcetam  500 mg Intravenous Q12H  . sodium chloride  3 mL Intravenous Q12H  . thiamine IV  100 mg Intravenous Daily    Time spent on care of this patient: 40 mins   Allie Bossier , MD   [facility] Office  [phone] Pager - [phone]  On-Call/Text Page:      [email or web address]      password [email or web address]  If 7PM-7AM, please contact night-coverage [email or web address] Password [email or web address] 04/20/2014, 2:13 PM   LOS: 4 days

## 2014-04-20 NOTE — Consult Note (Signed)
NEURO HOSPITALIST CONSULT NOTE    Reason for Consult: SDH with persistent confusion and seizures this morning.  HPI:                                                                                                                                          Diane Stevenson is an 80 y.o. female with a past medical history significant for HTN, DM type II, bilateral breast cancer, DJD, admitted to [facility] due to right hemispheric and right tentorium post traumatic SDH after a fall who has been experiencing fluctuating mental state changes and new onset seizures today. Patient follow by neurosurgery and no aggressive neurosurgical intervention recommended. Nursing staff reports off and on change in mental status, and recurrent but brief episodes of uncontrollable jerkiness involving the left leg greater than the right leg but with preservation of consciousness. Of note, she received some narcotics yesterday. Patient's fluctuation on mental status prompted a repeat CT brain yesterday which showed unchanged SDH in the locations described above. At this moment, patient is awake, follows simple commands, doesn't engage in conversation, and is having bouts of short lasting clonic movements involving the left greater than the right leg..  Serologies showed hyponatremia that is thought to be chronic.  Past Medical History  Diagnosis Date  . Breast cancer 08/04/1995    left  . Hypertension   . Type 2 diabetes mellitus   . Dyspareunia   . Diabetes mellitus   . GERD (gastroesophageal reflux disease)   . DJD (degenerative joint disease)     back, R arm & wrist, hands     Past Surgical History  Procedure Laterality Date  . Appendectomy    . Breast surgery  1998     Left Breast  . Breast surgery  1993    Right Breast  . Breast surgery  1997    Left Breast  . Partial hip arthroplasty  1998    Left side  . Foot arthrodesis, modified mcbride      1984  . Bone spur      1984  .  Joint replacement      rt knee  . Total hip arthroplasty  08/20/2011    Procedure: TOTAL HIP ARTHROPLASTY;  Surgeon: Kerin Salen, MD;  Location: [place];  Service: Orthopedics;  Laterality: Right;  DEPUY/PINNACLE CUP,SUMMIT BASIC STEM  . Eye surgery      cataracts removed, both eyes   . Fracture surgery      /L wrist- plate- 2004  . Kyphoplasty N/A 01/27/2013    Procedure: THORACIC ELEVEN, THORACIC TWELVE KYPHOPLASTY;  Surgeon: Winfield Cunas, MD;  Location: [place] [facility];  Service: Neurosurgery;  Laterality: N/A;  T11 and T12 kyphoplasty  . Open reduction internal fixation (orif) distal radial  fracture Right 06/09/2013    Procedure: OPEN REDUCTION INTERNAL FIXATION (ORIF) DISTAL RADIAL FRACTURE;  Surgeon: Linna Hoff, MD;  Location: [place];  Service: Orthopedics;  Laterality: Right;    Family History  Problem Relation Age of Onset  . Heart disease Brother   . Heart failure Mother   . CAD Sister   . Bone cancer Sister     Family History: as above   Social History:  reports that she has never smoked. She has never used smokeless tobacco. She reports that she does not drink alcohol or use illicit drugs.  Allergies  Allergen Reactions  . Celebrex [Celecoxib] Hives and Swelling    Caused lips to swell.  . Sulfur Hives and Swelling    Caused lips to swell  . Ibuprofen Itching and Swelling  . Statins Other (See Comments)    cramps  . Nitrofuran Derivatives Rash    MEDICATIONS:                                                                                                                     Scheduled: . cefTRIAXone (ROCEPHIN)  IV  1 g Intravenous Q24H  . folic acid  1 mg Intravenous Daily  . insulin aspart  0-15 Units Subcutaneous 6 times per day  . labetalol  5 mg Intravenous Q4H  . levETIRAcetam  1,000 mg Intravenous STAT  . [START ON 04/21/2014] levETIRAcetam  500 mg Intravenous Q12H  . sodium chloride  3 mL Intravenous Q12H  . thiamine IV  100 mg Intravenous Daily      ROS:   unobtainable from patient due to mental status                                                                                                       History obtained from unobtainable from patient due to mental status    Physical exam: some distress due to left leg discomfort. Head: normocephalic. Neck: supple, no bruits, no JVD. Cardiac: no murmurs. Lungs: clear. Abdomen: soft, no tender, no mass. Extremities: no edema.  Blood pressure 144/69, pulse 94, temperature 99.4 F (37.4 C), temperature source Oral, resp. rate 18, height $RemoveBe'5\' 5"'gnWlTcuzU$  (1.651 m), weight 66.2 kg (145 lb 15.1 oz), SpO2 94 %.   Neurologic Examination:  Lab Results  Component Value Date/Time   CHOL 122 04/19/2014 03:21 AM    Results for orders placed or performed during the hospital encounter of 04/16/14 (from the past 48 hour(s))  Glucose, capillary     Status: Abnormal   Collection Time: 04/18/14  4:16 PM  Result Value Ref Range   Glucose-Capillary 271 (H) 70 - 99 mg/dL  Glucose, capillary     Status: Abnormal   Collection Time: 04/18/14  8:53 PM  Result Value Ref Range   Glucose-Capillary 220 (H) 70 - 99 mg/dL  Glucose, capillary     Status: Abnormal   Collection Time: 04/18/14 11:50 PM  Result Value Ref Range   Glucose-Capillary 250 (H) 70 - 99 mg/dL  Comprehensive metabolic panel     Status: Abnormal   Collection Time: 04/19/14  3:21 AM  Result Value Ref Range   Sodium 128 (L) 137 - 147 mEq/L   Potassium 4.1 3.7 - 5.3 mEq/L   Chloride 91 (L) 96 - 112 mEq/L   CO2 22 19 - 32 mEq/L   Glucose, Bld 196 (H) 70 - 99 mg/dL   BUN 13 6 - 23 mg/dL   Creatinine, Ser 0.62 0.50 - 1.10 mg/dL   Calcium 8.8 8.4 - 10.5 mg/dL   Total Protein 6.9 6.0 - 8.3 g/dL   Albumin 3.4 (L) 3.5 - 5.2 g/dL   AST 15 0 - 37 U/L   ALT 17 0 - 35 U/L   Alkaline Phosphatase 61 39 - 117 U/L   Total Bilirubin 0.6 0.3 -  1.2 mg/dL   GFR calc non Af Amer 82 (L) >90 mL/min   GFR calc Af Amer >90 >90 mL/min    Comment: (NOTE) The eGFR has been calculated using the CKD EPI equation. This calculation has not been validated in all clinical situations. eGFR's persistently <90 mL/min signify possible Chronic Kidney Disease.    Anion gap 15 5 - 15  CBC with Differential     Status: Abnormal   Collection Time: 04/19/14  3:21 AM  Result Value Ref Range   WBC 11.9 (H) 4.0 - 10.5 K/uL   RBC 3.66 (L) 3.87 - 5.11 MIL/uL   Hemoglobin 12.0 12.0 - 15.0 g/dL   HCT 35.1 (L) 36.0 - 46.0 %   MCV 95.9 78.0 - 100.0 fL   MCH 32.8 26.0 - 34.0 pg   MCHC 34.2 30.0 - 36.0 g/dL   RDW 13.3 11.5 - 15.5 %   Platelets 226 150 - 400 K/uL   Neutrophils Relative % 85 (H) 43 - 77 %   Neutro Abs 10.1 (H) 1.7 - 7.7 K/uL   Lymphocytes Relative 8 (L) 12 - 46 %   Lymphs Abs 0.9 0.7 - 4.0 K/uL   Monocytes Relative 7 3 - 12 %   Monocytes Absolute 0.8 0.1 - 1.0 K/uL   Eosinophils Relative 0 0 - 5 %   Eosinophils Absolute 0.0 0.0 - 0.7 K/uL   Basophils Relative 0 0 - 1 %   Basophils Absolute 0.0 0.0 - 0.1 K/uL  Magnesium     Status: Abnormal   Collection Time: 04/19/14  3:21 AM  Result Value Ref Range   Magnesium 1.4 (L) 1.5 - 2.5 mg/dL  Osmolality     Status: None   Collection Time: 04/19/14  3:21 AM  Result Value Ref Range   Osmolality 278 275 - 300 mOsm/kg    Comment: Performed at [facility]  Uric acid  Status: None   Collection Time: 04/19/14  3:21 AM  Result Value Ref Range   Uric Acid, Serum 3.1 2.4 - 7.0 mg/dL  Lipid panel     Status: Abnormal   Collection Time: 04/19/14  3:21 AM  Result Value Ref Range   Cholesterol 122 0 - 200 mg/dL   Triglycerides 82 <150 mg/dL   HDL 38 (L) >39 mg/dL   Total CHOL/HDL Ratio 3.2 RATIO   VLDL 16 0 - 40 mg/dL   LDL Cholesterol 68 0 - 99 mg/dL    Comment:        Total Cholesterol/HDL:CHD Risk Coronary Heart Disease Risk Table                     Men   Women  1/2 Average  Risk   3.4   3.3  Average Risk       5.0   4.4  2 X Average Risk   9.6   7.1  3 X Average Risk  23.4   11.0        Use the calculated Patient Ratio above and the CHD Risk Table to determine the patient's CHD Risk.        ATP III CLASSIFICATION (LDL):  <100     mg/dL   Optimal  100-129  mg/dL   Near or Above                    Optimal  130-159  mg/dL   Borderline  160-189  mg/dL   High  >190     mg/dL   Very High   Glucose, capillary     Status: Abnormal   Collection Time: 04/19/14  3:32 AM  Result Value Ref Range   Glucose-Capillary 210 (H) 70 - 99 mg/dL  Glucose, capillary     Status: Abnormal   Collection Time: 04/19/14  7:47 AM  Result Value Ref Range   Glucose-Capillary 247 (H) 70 - 99 mg/dL   Comment 1 Documented in Chart    Comment 2 Notify RN   Glucose, capillary     Status: Abnormal   Collection Time: 04/19/14 11:31 AM  Result Value Ref Range   Glucose-Capillary 246 (H) 70 - 99 mg/dL   Comment 1 Documented in Chart    Comment 2 Notify RN   Glucose, capillary     Status: Abnormal   Collection Time: 04/19/14  4:28 PM  Result Value Ref Range   Glucose-Capillary 209 (H) 70 - 99 mg/dL   Comment 1 Documented in Chart    Comment 2 Notify RN   Glucose, capillary     Status: Abnormal   Collection Time: 04/19/14  7:54 PM  Result Value Ref Range   Glucose-Capillary 213 (H) 70 - 99 mg/dL   Comment 1 Documented in Chart    Comment 2 Notify RN   Basic metabolic panel     Status: Abnormal   Collection Time: 04/19/14  8:10 PM  Result Value Ref Range   Sodium 126 (L) 137 - 147 mEq/L   Potassium 4.1 3.7 - 5.3 mEq/L   Chloride 88 (L) 96 - 112 mEq/L   CO2 21 19 - 32 mEq/L   Glucose, Bld 210 (H) 70 - 99 mg/dL   BUN 18 6 - 23 mg/dL   Creatinine, Ser 0.64 0.50 - 1.10 mg/dL   Calcium 8.9 8.4 - 10.5 mg/dL   GFR calc non Af Amer 81 (L) >90 mL/min   GFR  calc Af Amer >90 >90 mL/min    Comment: (NOTE) The eGFR has been calculated using the CKD EPI equation. This calculation has  not been validated in all clinical situations. eGFR's persistently <90 mL/min signify possible Chronic Kidney Disease.    Anion gap 17 (H) 5 - 15  Glucose, capillary     Status: Abnormal   Collection Time: 04/20/14 12:05 AM  Result Value Ref Range   Glucose-Capillary 204 (H) 70 - 99 mg/dL   Comment 1 Documented in Chart    Comment 2 Notify RN   Comprehensive metabolic panel     Status: Abnormal   Collection Time: 04/20/14  2:35 AM  Result Value Ref Range   Sodium 130 (L) 137 - 147 mEq/L   Potassium 4.2 3.7 - 5.3 mEq/L   Chloride 93 (L) 96 - 112 mEq/L   CO2 21 19 - 32 mEq/L   Glucose, Bld 212 (H) 70 - 99 mg/dL   BUN 19 6 - 23 mg/dL   Creatinine, Ser 0.62 0.50 - 1.10 mg/dL   Calcium 8.9 8.4 - 10.5 mg/dL   Total Protein 6.9 6.0 - 8.3 g/dL   Albumin 3.2 (L) 3.5 - 5.2 g/dL   AST 17 0 - 37 U/L   ALT 15 0 - 35 U/L   Alkaline Phosphatase 60 39 - 117 U/L   Total Bilirubin 0.5 0.3 - 1.2 mg/dL   GFR calc non Af Amer 82 (L) >90 mL/min   GFR calc Af Amer >90 >90 mL/min    Comment: (NOTE) The eGFR has been calculated using the CKD EPI equation. This calculation has not been validated in all clinical situations. eGFR's persistently <90 mL/min signify possible Chronic Kidney Disease.    Anion gap 16 (H) 5 - 15  CBC     Status: Abnormal   Collection Time: 04/20/14  2:35 AM  Result Value Ref Range   WBC 8.0 4.0 - 10.5 K/uL   RBC 3.53 (L) 3.87 - 5.11 MIL/uL   Hemoglobin 11.5 (L) 12.0 - 15.0 g/dL   HCT 33.7 (L) 36.0 - 46.0 %   MCV 95.5 78.0 - 100.0 fL   MCH 32.6 26.0 - 34.0 pg   MCHC 34.1 30.0 - 36.0 g/dL   RDW 13.5 11.5 - 15.5 %   Platelets 209 150 - 400 K/uL  Glucose, capillary     Status: Abnormal   Collection Time: 04/20/14  4:10 AM  Result Value Ref Range   Glucose-Capillary 188 (H) 70 - 99 mg/dL  Glucose, capillary     Status: Abnormal   Collection Time: 04/20/14  8:03 AM  Result Value Ref Range   Glucose-Capillary 160 (H) 70 - 99 mg/dL   Comment 1 Documented in Chart     Comment 2 Notify RN   Glucose, capillary     Status: Abnormal   Collection Time: 04/20/14 11:18 AM  Result Value Ref Range   Glucose-Capillary 196 (H) 70 - 99 mg/dL   Comment 1 Documented in Chart    Comment 2 Notify RN     Ct Head Wo Contrast  04/19/2014   CLINICAL DATA:  80 year old female with increased lethargy, not following commands. Known subdural hematoma. Initial encounter.  EXAM: CT HEAD WITHOUT CONTRAST  TECHNIQUE: Contiguous axial images were obtained from the base of the skull through the vertex without intravenous contrast.  COMPARISON:  04/18/2014 and earlier.  FINDINGS: Visualized paranasal sinuses and mastoids are clear. No acute orbit or scalp soft tissue finding identified. No  acute osseous abnormality identified.  Calcified atherosclerosis at the skull base.  More homogeneously hyperdense  Parafalcine and right tentorial subdural hematoma, stable in size and configuration. A smaller component of right peripheral subdural hemorrhage is stable to decreased (series 21 image at 13). Basilar cisterns remain patent and are stable. Stable ventricle size and configuration. No new or increased intracranial mass effect. Patchy cerebral white matter hypodensity is stable. No evidence of cortically based acute infarction identified. Stable intracranial vascular density. No intraventricular hemorrhage. No new intracranial hemorrhage identified.  IMPRESSION: 1. Stable since 04/18/2014 parafalcine and right hemispheric subdural hematoma. Stable associated mass effect including partial effacement of the right lateral ventricle. 2. No new intracranial abnormality.   Electronically Signed   By: Lars Pinks M.D.   On: 04/19/2014 18:48   Ct Head Wo Contrast  04/18/2014   CLINICAL DATA:  Known subdural hematoma, recent change in mental status  EXAM: CT HEAD WITHOUT CONTRAST  TECHNIQUE: Contiguous axial images were obtained from the base of the skull through the vertex without intravenous contrast.   COMPARISON:  04/17/2014  FINDINGS: There has been an increase in the degree of subdural hematoma along the falx on the right. It now measures almost 17 mm in thickness. Very minimal midline shift from right to left is noted. Additionally, there has been increase in the degree of subdural hematoma along the right frontal, parietal and temporal regions now measuring approximately 3 mm. No acute infarct is noted.  IMPRESSION: Increasing subdural hematoma along the falx cerebri as well as in the right frontal, parietal and temporal regions. Minimal midline shift from right to left is noted.  These results were called by telephone at the time of interpretation on 04/18/2014 at 9:48 pm to Truman Hayward, the patients nurse who verbally acknowledged these results.   Electronically Signed   By: Inez Catalina M.D.   On: 04/18/2014 21:48   Mr Thoracic Spine Wo Contrast  04/18/2014   CLINICAL DATA:  Upper back pain secondary to a fall two days ago.  EXAM: MRI THORACIC SPINE WITHOUT CONTRAST  TECHNIQUE: Multiplanar, multisequence MR imaging of the thoracic spine was performed. No intravenous contrast was administered.  COMPARISON:  CT scan of the thoracic spine dated 04/17/2014  FINDINGS: There are acute slight compression fractures of T1 and T6. There is no protrusion of bone or disc material into the spinal canal. There is no epidural hemorrhage. There is a injury to the intraspinous ligaments between the spinous processes of C7 and T1 and T1 and T2 as well as a between the spinous processes of T5 and T6 and T6 and T7.  There is suggestion of a tiny focal area of myelopathy of the anterior aspect of the spinal cord at T3 and there is a central syrinx extending from T6 through T7-8. No visible mass. Facet joints are normal. No foraminal or spinal stenosis.  There is an old compression fracture of the inferior endplate of T26. There is moderate thoracic spinal stenosis at T10-11 due to hypertrophy of the ligamentum flavum a. The spinal  cord appears slightly compressed but there is no myelopathy. Tip of the conus is below the superior aspect of L2. There are old compression fractures of T11 and T12 which have been treated with vertebroplasty.  Incidental note is made of a prominent fluid collection seen in the right axilla, probably a large ganglion cyst originating from right glenohumeral joint. This is incompletely visualized and appears to contain loose body.  IMPRESSION: 1. Acute benign  appearing mild compression fractures of T1 and T6. No neural impingement at either level. 2. Old compression fractures of T10, T11, and T12. 3. Moderate thoracic spinal stenosis at T10-11 with slight compression of the spinal cord without myelopathy. 4. 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 5. Tiny area of myelopathy in the anterior aspect of the spinal cord at T3.   Electronically Signed   By: Rozetta Nunnery M.D.   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 MRI brain has been ordered. Will load with 1 gram IV keppra and also give IV ativan 1 mg to try to abort seizures. May necessitate adding a second agent if no success with keppra.  Continue IV keppra 500 mg BID starting tomorrow.  Updated family who is at the bedside. Will follow up.  Dorian Pod, MD 04/20/2014, 1:37 PM  [facility]

## 2014-04-21 ENCOUNTER — Inpatient Hospital Stay (HOSPITAL_COMMUNITY): Payer: Medicare Other

## 2014-04-21 DIAGNOSIS — G40901 Epilepsy, unspecified, not intractable, with status epilepticus: Secondary | ICD-10-CM

## 2014-04-21 DIAGNOSIS — J9601 Acute respiratory failure with hypoxia: Secondary | ICD-10-CM

## 2014-04-21 DIAGNOSIS — S22059S Unspecified fracture of T5-T6 vertebra, sequela: Secondary | ICD-10-CM

## 2014-04-21 DIAGNOSIS — G40109 Localization-related (focal) (partial) symptomatic epilepsy and epileptic syndromes with simple partial seizures, not intractable, without status epilepticus: Secondary | ICD-10-CM | POA: Insufficient documentation

## 2014-04-21 DIAGNOSIS — W19XXXS Unspecified fall, sequela: Secondary | ICD-10-CM

## 2014-04-21 DIAGNOSIS — S22059A Unspecified fracture of T5-T6 vertebra, initial encounter for closed fracture: Secondary | ICD-10-CM | POA: Insufficient documentation

## 2014-04-21 DIAGNOSIS — E46 Unspecified protein-calorie malnutrition: Secondary | ICD-10-CM

## 2014-04-21 DIAGNOSIS — G9340 Encephalopathy, unspecified: Secondary | ICD-10-CM

## 2014-04-21 DIAGNOSIS — R64 Cachexia: Secondary | ICD-10-CM

## 2014-04-21 DIAGNOSIS — I369 Nonrheumatic tricuspid valve disorder, unspecified: Secondary | ICD-10-CM

## 2014-04-21 DIAGNOSIS — W19XXXA Unspecified fall, initial encounter: Secondary | ICD-10-CM | POA: Insufficient documentation

## 2014-04-21 DIAGNOSIS — J9600 Acute respiratory failure, unspecified whether with hypoxia or hypercapnia: Secondary | ICD-10-CM | POA: Insufficient documentation

## 2014-04-21 LAB — GLUCOSE, CAPILLARY
GLUCOSE-CAPILLARY: 131 mg/dL — AB (ref 70–99)
GLUCOSE-CAPILLARY: 163 mg/dL — AB (ref 70–99)
GLUCOSE-CAPILLARY: 172 mg/dL — AB (ref 70–99)
GLUCOSE-CAPILLARY: 181 mg/dL — AB (ref 70–99)
Glucose-Capillary: 132 mg/dL — ABNORMAL HIGH (ref 70–99)
Glucose-Capillary: 161 mg/dL — ABNORMAL HIGH (ref 70–99)
Glucose-Capillary: 175 mg/dL — ABNORMAL HIGH (ref 70–99)

## 2014-04-21 LAB — OSMOLALITY, URINE: Osmolality, Ur: 577 mOsm/kg (ref 390–1090)

## 2014-04-21 LAB — BASIC METABOLIC PANEL
ANION GAP: 16 — AB (ref 5–15)
BUN: 18 mg/dL (ref 6–23)
CO2: 20 mEq/L (ref 19–32)
CREATININE: 0.54 mg/dL (ref 0.50–1.10)
Calcium: 8.5 mg/dL (ref 8.4–10.5)
Chloride: 97 mEq/L (ref 96–112)
GFR calc non Af Amer: 86 mL/min — ABNORMAL LOW (ref >90)
Glucose, Bld: 187 mg/dL — ABNORMAL HIGH (ref 70–99)
Potassium: 4 mEq/L (ref 3.7–5.3)
Sodium: 133 mEq/L — ABNORMAL LOW (ref 137–147)

## 2014-04-21 LAB — POCT I-STAT 3, ART BLOOD GAS (G3+)
Acid-Base Excess: 1 mmol/L (ref 0.0–2.0)
BICARBONATE: 23.8 meq/L (ref 20.0–24.0)
O2 Saturation: 100 %
PCO2 ART: 33.2 mmHg — AB (ref 35.0–45.0)
PO2 ART: 202 mmHg — AB (ref 80.0–100.0)
Patient temperature: 99
TCO2: 25 mmol/L (ref 0–100)
pH, Arterial: 7.464 — ABNORMAL HIGH (ref 7.350–7.450)

## 2014-04-21 LAB — CBC
HCT: 32.8 % — ABNORMAL LOW (ref 36.0–46.0)
Hemoglobin: 11.2 g/dL — ABNORMAL LOW (ref 12.0–15.0)
MCH: 33.5 pg (ref 26.0–34.0)
MCHC: 34.1 g/dL (ref 30.0–36.0)
MCV: 98.2 fL (ref 78.0–100.0)
PLATELETS: 210 10*3/uL (ref 150–400)
RBC: 3.34 MIL/uL — ABNORMAL LOW (ref 3.87–5.11)
RDW: 13.4 % (ref 11.5–15.5)
WBC: 7.8 10*3/uL (ref 4.0–10.5)

## 2014-04-21 LAB — TRIGLYCERIDES: TRIGLYCERIDES: 100 mg/dL (ref <150)

## 2014-04-21 LAB — CK TOTAL AND CKMB (NOT AT ARMC)
CK, MB: <1 ng/mL (ref 0.3–4.0)
Total CK: 27 U/L (ref 7–177)

## 2014-04-21 LAB — MAGNESIUM: MAGNESIUM: 1.7 mg/dL (ref 1.5–2.5)

## 2014-04-21 LAB — HEMOGLOBIN A1C
Hgb A1c MFr Bld: 6.2 % — ABNORMAL HIGH (ref <5.7)
Mean Plasma Glucose: 131 mg/dL — ABNORMAL HIGH (ref <117)

## 2014-04-21 LAB — LACTIC ACID, PLASMA: Lactic Acid, Venous: 1.4 mmol/L (ref 0.5–2.2)

## 2014-04-21 LAB — PHOSPHORUS: Phosphorus: 2.6 mg/dL (ref 2.3–4.6)

## 2014-04-21 MED ORDER — CETYLPYRIDINIUM CHLORIDE 0.05 % MT LIQD
7.0000 mL | Freq: Four times a day (QID) | OROMUCOSAL | Status: DC
  Administered 2014-04-21 – 2014-05-01 (×40): 7 mL via OROMUCOSAL

## 2014-04-21 MED ORDER — CHLORHEXIDINE GLUCONATE 0.12 % MT SOLN
15.0000 mL | Freq: Two times a day (BID) | OROMUCOSAL | Status: DC
  Administered 2014-04-21 – 2014-05-01 (×20): 15 mL via OROMUCOSAL
  Filled 2014-04-21 (×19): qty 15

## 2014-04-21 MED ORDER — FENTANYL CITRATE 0.05 MG/ML IJ SOLN: 50.0000 ug | INTRAMUSCULAR | Status: DC | PRN

## 2014-04-21 MED ORDER — FENTANYL CITRATE 0.05 MG/ML IJ SOLN
100.0000 ug | Freq: Once | INTRAMUSCULAR | Status: AC
  Administered 2014-04-21: 100 ug via INTRAVENOUS

## 2014-04-21 MED ORDER — MIDAZOLAM HCL 2 MG/2ML IJ SOLN
2.0000 mg | Freq: Once | INTRAMUSCULAR | Status: AC
  Administered 2014-04-21: 2 mg via INTRAVENOUS

## 2014-04-21 MED ORDER — PROPOFOL 10 MG/ML IV EMUL
0.0000 ug/kg/min | INTRAVENOUS | Status: DC
  Administered 2014-04-21: 5 ug/kg/min via INTRAVENOUS
  Administered 2014-04-22: 40 ug/kg/min via INTRAVENOUS
  Filled 2014-04-21 (×2): qty 100

## 2014-04-21 MED ORDER — CETYLPYRIDINIUM CHLORIDE 0.05 % MT LIQD
7.0000 mL | Freq: Two times a day (BID) | OROMUCOSAL | Status: DC
  Administered 2014-04-21 (×2): 7 mL via OROMUCOSAL

## 2014-04-21 MED ORDER — FENTANYL CITRATE 0.05 MG/ML IJ SOLN
INTRAMUSCULAR | Status: AC
  Filled 2014-04-21: qty 2

## 2014-04-21 MED ORDER — CHLORHEXIDINE GLUCONATE 0.12 % MT SOLN
15.0000 mL | Freq: Two times a day (BID) | OROMUCOSAL | Status: DC
  Filled 2014-04-21 (×4): qty 15

## 2014-04-21 MED ORDER — SODIUM CHLORIDE 0.9 % IV SOLN
200.0000 mg | INTRAVENOUS | Status: AC
  Administered 2014-04-21: 200 mg via INTRAVENOUS
  Filled 2014-04-21: qty 20

## 2014-04-21 MED ORDER — FAMOTIDINE IN NACL 20-0.9 MG/50ML-% IV SOLN
20.0000 mg | Freq: Two times a day (BID) | INTRAVENOUS | Status: DC
  Administered 2014-04-21 – 2014-04-22 (×2): 20 mg via INTRAVENOUS
  Filled 2014-04-21 (×3): qty 50

## 2014-04-21 MED ORDER — MIDAZOLAM HCL 2 MG/2ML IJ SOLN
INTRAMUSCULAR | Status: AC
  Filled 2014-04-21: qty 2

## 2014-04-21 MED ORDER — LEVETIRACETAM IN NACL 1000 MG/100ML IV SOLN
1000.0000 mg | Freq: Two times a day (BID) | INTRAVENOUS | Status: DC
  Administered 2014-04-21 – 2014-05-06 (×30): 1000 mg via INTRAVENOUS
  Filled 2014-04-21 (×33): qty 100

## 2014-04-21 MED ORDER — SODIUM CHLORIDE 0.9 % IV SOLN
100.0000 mg | Freq: Two times a day (BID) | INTRAVENOUS | Status: DC
  Administered 2014-04-22 – 2014-04-25 (×8): 100 mg via INTRAVENOUS
  Filled 2014-04-21 (×16): qty 10

## 2014-04-21 MED ORDER — ETOMIDATE 2 MG/ML IV SOLN
20.0000 mg | Freq: Once | INTRAVENOUS | Status: AC
  Administered 2014-04-21: 20 mg via INTRAVENOUS

## 2014-04-21 NOTE — Progress Notes (Signed)
Patient was seen and examined. She had another seizure last evening and has received Keppra and Ativan. Therefore she is somnolent, she does yawn and move her extremities. I think her decrease in mental status is related to the seizure in the medications. I do not think repeat scanning is necessary. Screws this with the husband. The neurologist was at the bedside also. He is ordering an EEG.

## 2014-04-21 NOTE — Progress Notes (Signed)
Report called to receiving RN. All belongs were packed and sent with the pt. Pt transferred via bed to [id number]. Accompanied by RN and NA. Receiving RN was updated at bedside.

## 2014-04-21 NOTE — Progress Notes (Signed)
STROKE TEAM PROGRESS NOTE   HISTORY Diane Stevenson is an 80 y.o. female with a past medical history significant for HTN, DM type II, bilateral breast cancer, DJD, admitted to [facility] due to right hemispheric and right tentorium post traumatic SDH after a fall who has been experiencing fluctuating mental state changes and new onset seizures today. Patient follow by neurosurgery and no aggressive neurosurgical intervention recommended. Nursing staff reports off and on change in mental status, and recurrent but brief episodes of uncontrollable jerkiness involving the left leg greater than the right leg but with preservation of consciousness. Of note, she received some narcotics yesterday. Patient's fluctuation on mental status prompted a repeat CT brain yesterday which showed unchanged SDH in the locations described above. At this moment, patient is awake, follows simple commands, doesn't engage in conversation, and is having bouts of short lasting clonic movements involving the left greater than the right leg..  Serologies showed hyponatremia that is thought to be chronic.  The patient was not treated with TPA secondary to posttraumatic subdural hematoma.    SUBJECTIVE (INTERVAL HISTORY) Her husband  is at the bedside.  Overall she feels her condition is gradually worsening. She had another seizure last night and was given ativan and has been unresponsive since then.Husbands agrees to patient falling last Tuesday backwards and landing on her back and  Hitting her head.   OBJECTIVE Temp:  [97.7 F (36.5 C)-99.9 F (37.7 C)] 98.8 F (37.1 C) (11/27 1525) Pulse Rate:  [66-112] 88 (11/27 1119) Cardiac Rhythm:  [-] Normal sinus rhythm (11/27 0745) Resp:  [16-39] 39 (11/27 1119) BP: (140-160)/(61-81) 160/81 mmHg (11/27 1119) SpO2:  [94 %-100 %] 97 % (11/27 1119)   Recent Labs Lab 04/20/14 2025 04/21/14 0024 04/21/14 0421 04/21/14 0801 04/21/14 1232  GLUCAP 150* 161* 132* 131* 163*     Recent Labs Lab 04/17/14 1036 04/18/14 0024 04/19/14 0321 04/19/14 2010 04/20/14 0235  NA 135* 129* 128* 126* 130*  K 4.0 4.0 4.1 4.1 4.2  CL 97 93* 91* 88* 93*  CO2 22 22 22 21 21   GLUCOSE 155* 169* 196* 210* 212*  BUN 11 11 13 18 19   CREATININE 0.65 0.66 0.62 0.64 0.62  CALCIUM 8.6 8.4 8.8 8.9 8.9  MG 1.1*  --  1.4*  --   --   PHOS 3.0  --   --   --   --     Recent Labs Lab 04/17/14 0155 04/17/14 1036 04/19/14 0321 04/20/14 0235  AST 26 20 15 17   ALT 22 19 17 15   ALKPHOS 54 55 61 60  BILITOT 0.3 0.6 0.6 0.5  PROT 6.6 6.5 6.9 6.9  ALBUMIN 3.5 3.4* 3.4* 3.2*    Recent Labs Lab 04/17/14 0155 04/17/14 1036 04/19/14 0321 04/20/14 0235  WBC 8.7 7.7 11.9* 8.0  NEUTROABS 6.4  --  10.1*  --   HGB 11.8* 11.9* 12.0 11.5*  HCT 34.7* 34.8* 35.1* 33.7*  MCV 96.7 98.6 95.9 95.5  PLT 200 176 226 209    Recent Labs Lab 04/17/14 0155 04/17/14 0810 04/17/14 1430  TROPONINI <0.30 <0.30 <0.30   No results for input(s): LABPROT, INR in the last 72 hours. No results for input(s): COLORURINE, LABSPEC, [place], GLUCOSEU, HGBUR, BILIRUBINUR, KETONESUR, PROTEINUR, UROBILINOGEN, NITRITE, LEUKOCYTESUR in the last 72 hours.  Invalid input(s): APPERANCEUR     Component Value Date/Time   CHOL 122 04/19/2014 0321   TRIG 82 04/19/2014 0321   HDL 38* 04/19/2014 0321   CHOLHDL  3.2 04/19/2014 0321   VLDL 16 04/19/2014 0321   LDLCALC 68 04/19/2014 0321   Lab Results  Component Value Date   HGBA1C 6.6* 04/17/2014   No results found for: LABOPIA, COCAINSCRNUR, LABBENZ, AMPHETMU, THCU, LABBARB  No results for input(s): ETH in the last 168 hours.   2-D echocardiogram  04/21/2014 Left ventricle: The cavity size was normal. Wall thickness was increased in a pattern of mild LVH. Systolic function was normal. The estimated ejection fraction was in the range of 60% to 65%. Wall motion was normal; there were no regional wall motion abnormalities. Doppler parameters  are consistent with abnormal left ventricular relaxation (grade 1 diastolic dysfunction). The E/e&' ratio is between 8-15, suggesting indeterminate LV filling pressure. - Left atrium: The atrium was normal in size. - Tricuspid valve: There was mild regurgitation. - Pulmonary arteries: PA peak pressure: 36 mm Hg (S).  Impressions:  - LVEF 60-65%, mild LVH, normal wall motion, diastolic dysfunction, indeterminate LV filling pressure. mild TR, RVSP 36 mmHg.    Ct Head Wo Contrast 04/19/2014    1. Stable since 04/18/2014 parafalcine and right hemispheric subdural hematoma. Stable associated mass effect including partial effacement of the right lateral ventricle.  2. No new intracranial abnormality.      Mr Brain Wo Contrast 04/20/2014    1. Subdural hematoma re - identified in the right hemisphere and stable from recent CTs, including lobulated component along the interhemispheric fissure. Small component in the right posterior fossa.  2. Subtle signal abnormality in the posterior right cingulate gyrus adjacent to the parafalcine hematoma in this setting is suggestive of gyral edema due to recent seizure.  3. Superimposed small acute lacunar infarct in the left thalamus. Possible second tiny lacune in the left occipital pole. No hemorrhage or mass effect associated with these.  4. Underlying chronic small vessel disease.       PHYSICAL EXAM Elderly Caucasian lady who is unresponsive.Awake alert. Afebrile. Head is nontraumatic. Neck is supple without bruit. Hearing is normal. Cardiac exam no murmur or gallop. Lungs are clear to auscultation. Distal pulses are well felt. Neurological Exam : unresponsive. Eyes closed not following commands. Pupils 3 mm reactive. Dolls eye movements sluggish. Face symmetric. Not following commands. Withdraws left side more than right to sternal rub. No posturing. Plantars down going.   ASSESSMENT/PLAN Diane Stevenson is a 80 y.o. female with  history of hypertension, diabetes mellitus, bilateral breast cancer, and DJD presenting with a posttraumatic subdural hematoma. She did did not receive intravenous TPA secondary to hemorrhage.   Stroke:  Non dominant right hemisphere subdural hematoma with multiple lacunar infarcts-left thalamus and right occipital pole. And focal motor seizures.  Resultant  Unresponsive state and focal motor seizures  MRI  as above  MRA  not ordered  Carotid Doppler  pending  2D Echo  as above - unremarkable  LDL 68   HgbA1c 6.6  SCDs for VTE prophylaxis  Diet NPO time specified no liquids  aspirin 81 mg orally every day prior to admission, now on no antithrombotics  Ongoing aggressive stroke risk factor management  Therapy recommendations: Pending   Disposition:  Pending   Hypertension  Home meds: Norvasc 2.5 mg daily and Altace 10 mg daily   Stable   Diabetes  HgbA1c 6.6  goal < 7.0  Controlled  Other Stroke Risk Factors  Advanced age   Other Active Problems  New-onset seizures    Small lacunar strokes by MRI  Mild anemia  Hyponatremia  Other Pertinent History    Hospital day # 5  I have personally examined this patient, reviewed notes, independently viewed imaging studies, participated in medical decision making and plan of care. I have made any additions or clarifications directly to the above note. Agree with note above. Patient had post traumatic subdural hematoma involving falx and tentorium with symptomatic focal motor seizures. Exam complicated by sedation effect of ativan but must check EEG for nonconvulsive seizures.Patient is not a candidate for antiplatelets due to acute brain subdural hemorrhage.  Antony Contras, MD Medical Director [facility] Pager: [phone] 04/21/2014 5:39 PM     To contact Stroke Continuity provider, please refer to [email or web address] After hours, contact General Neurology

## 2014-04-21 NOTE — Progress Notes (Signed)
[facility] Progress Note Patient Name: Diane Stevenson DOB: 25-Mar-1930 MRN: [id number]   Date of Service  04/21/2014  HPI/Events of Note  Called for consult from neurology [name] has status epilepticus on continuous EEG monitoring, they request intubation for propofol infusion  eICU Interventions  Neurology to transfer to 50M Have notified charge nurse to prepare for intubation     Intervention Category Major Interventions: Seizures - evaluation and management Evaluation Type: New Patient Evaluation  [name] 04/21/2014, 7:18 PM

## 2014-04-21 NOTE — Progress Notes (Signed)
The patient has become less responsive over the last 6 hours.  Speech more slurred and will no longer follow commands.  Full bath and bed change completed. Mild tonic clonic jerking like focal seizure activity noted in left arm and leg with external stimulation such as bath or turning.  No seizure activity noted through the night until morning bath time. 1 mg  IV ativan given and she is now free from seizure activity.  Will continue to monitor.

## 2014-04-21 NOTE — Progress Notes (Signed)
Routine EEG ordered by Dr Leonie Man. Pt had frequent possible seizure events while on EEG and during hookup of EEG. Dr Armida Sans was paged and ordered an LTM EEG. EET is started and nurse educated to equipment and event button.

## 2014-04-21 NOTE — Progress Notes (Signed)
PT Cancellation Note  Patient Details Name: CHAVIE KOLINSKI MRN: [id number] DOB: 1930/03/23   Cancelled Treatment:    Reason Eval/Treat Not Completed: Patient not medically ready; RN reports patient was having seizures and had Ativan and has been difficult to rouse since and patient is not ready for PT yet.  Will attempt again tomorrow.   WYNN,CYNDI 04/21/2014, 3:29 PM

## 2014-04-21 NOTE — Progress Notes (Signed)
[place] Progress Note Patient Name: SISSI PADIA DOB: 08-07-1929 MRN: [id number]   Date of Service  04/21/2014  HPI/Events of Note    eICU Interventions  Vent/sedation orders written        Simonne Maffucci 04/21/2014, 8:54 PM

## 2014-04-21 NOTE — Consult Note (Addendum)
PULMONARY / CRITICAL CARE MEDICINE   Name: Diane Stevenson MRN: [id number] DOB: 31-May-1929   LOS 5 days PCP Irven Shelling, MD   ADMISSION DATE:  04/16/2014 CONSULTATION DATE:  04/21/14  REFERRING MD :  Dr Dorian Pod of neuro and Dr Fonnie Puffer Of [facility]  CHIEF COMPLAINT:  STatus epilepticus with at risk for respiratory compromise  INITIAL PRESENTATION: see hpi  SIGNIFICANT EVENTS: 04/16/2014 - admit 04/21/14 - ICU transfer, PCCM primary and Intubation for diprivan gtt and continuous EEG   HISTORY OF PRESENT ILLNESS:  80 year old female who is cachectic and multiple medical problems including breast cancer 1997 and type 2 diabetes sustained subdural hematoma in the right frontal lobe FALCINE SDH after a fall on 04/16/2014. In addition she also sustained 3 fractures of T1, T6 and T10 above her kyphoplasty's at T11-T12 along with nondisplaced right 11th rib fracture. All this s was deemed nonoperative by neurosurgery and she was admitted to stepdown ICU by the tried hospitalist service. She was being medically managed on the floor then and all along she had some intermittent confusion on the 2015 she had acute mental status change and CT head showed expansion of the subdural hematoma. Then on 04/20/2014 she developed in association wit chronic hyponatremia new onset seizures of clonic movements involving the left greater than the right leg and also the left upper extremity. EEG on 04/21/2014 showed status epilepticus despite medical management and patient was moved to the intensive care unit under the critical care medicine service for respiratory compromise, and her tracheal intubation and acute respiratory failure and controlling seizures with DIPRIVAN. PCCM will be primary from 04/21/14  PAST MEDICAL HISTORY :   has a past medical history of Breast cancer (08/04/1995); Hypertension; Type 2 diabetes mellitus; Dyspareunia; Diabetes mellitus; GERD (gastroesophageal reflux  disease); and DJD (degenerative joint disease).  has past surgical history that includes Appendectomy; Breast surgery (1998 ); Breast surgery (1993); Breast surgery (1997); Partial hip arthroplasty (1998); Foot arthrodesis, modified Mcbride; bone spur; Joint replacement; Total hip arthroplasty (08/20/2011); Eye surgery; Fracture surgery; Kyphoplasty (N/A, 01/27/2013); and Open reduction internal fixation (orif) distal radial fracture (Right, 06/09/2013). Prior to Admission medications   Medication Sig Start Date End Date Taking? Authorizing Provider  alendronate (FOSAMAX) 70 MG tablet Take 70 mg by mouth once a week. Take with a full glass of water on an empty stomach. Take on Wednesday   Yes Historical Provider, MD  amLODipine (NORVASC) 2.5 MG tablet Take 2.5 mg by mouth daily.   Yes Historical Provider, MD  aspirin 81 MG tablet Take 81 mg by mouth daily.   Yes Historical Provider, MD  calcium carbonate (TUMS EX) 750 MG chewable tablet Chew 1 tablet by mouth 2 (two) times daily as needed for heartburn.   Yes Historical Provider, MD  Calcium Carbonate-Vitamin D (CALCIUM 600 + D PO) Take 1 tablet by mouth 2 (two) times daily.   Yes Historical Provider, MD  docusate sodium (COLACE) 100 MG capsule Take 1 capsule (100 mg total) by mouth 2 (two) times daily. 06/12/13  Yes Linna Hoff, MD  ferrous sulfate 325 (65 FE) MG tablet Take 325 mg by mouth daily with breakfast.   Yes Historical Provider, MD  HYDROcodone-acetaminophen (NORCO) 5-325 MG per tablet Take 1 tablet by mouth every 6 (six) hours as needed for moderate pain. 09/06/13  Yes Liam Graham, PA-C  metFORMIN (GLUCOPHAGE) 1000 MG tablet Take 1,000 mg by mouth 2 (two) times daily with a meal.   Yes  Historical Provider, MD  polyethylene glycol powder (GLYCOLAX/MIRALAX) powder Take 17 g by mouth daily. 06/15/13  Yes Gerlene Fee, NP  ramipril (ALTACE) 10 MG capsule Take 10 mg by mouth daily.   Yes Historical Provider, MD  vitamin B-12 (CYANOCOBALAMIN)  1000 MCG tablet Take 1,000 mcg by mouth every other day.    Yes Historical Provider, MD  cholecalciferol (VITAMIN D) 1000 UNITS tablet Take 1,000 Units by mouth at bedtime.     Historical Provider, MD  FLUZONE HIGH-DOSE 0.5 ML SUSY Inject 1 Dose as directed once. 03/02/14   Historical Provider, MD  HYDROcodone-acetaminophen (NORCO) 5-325 MG per tablet Take 1 tablet by mouth every 6 (six) hours as needed for moderate pain. Patient not taking: Reported on 04/16/2014 06/12/13   Linna Hoff, MD  vitamin C (ASCORBIC ACID) 500 MG tablet Take 1 tablet (500 mg total) by mouth daily. Patient not taking: Reported on 04/16/2014 06/12/13   Linna Hoff, MD   Allergies  Allergen Reactions  . Celebrex [Celecoxib] Hives and Swelling    Caused lips to swell.  . Sulfur Hives and Swelling    Caused lips to swell  . Ibuprofen Itching and Swelling  . Statins Other (See Comments)    cramps  . Nitrofuran Derivatives Rash    FAMILY HISTORY:  indicated that her mother is deceased. She indicated that her father is deceased. She indicated that only one of her two sisters is alive. She indicated that only one of her two brothers is alive.  SOCIAL HISTORY:  reports that she has never smoked. She has never used smokeless tobacco. She reports that she does not drink alcohol or use illicit drugs.  REVIEW OF SYSTEMS:  Not elicitable due to status  SUBJECTIVE:   VITAL SIGNS: Temp:  [98 F (36.7 C)-99.9 F (37.7 C)] 98.8 F (37.1 C) (11/27 1525) Pulse Rate:  [66-113] 95 (11/27 2102) Resp:  [15-39] 15 (11/27 2102) BP: (140-160)/(67-81) 157/70 mmHg (11/27 1931) SpO2:  [96 %-100 %] 100 % (11/27 2102) FiO2 (%):  [50 %] 50 % (11/27 2102) HEMODYNAMICS:   VENTILATOR SETTINGS: Vent Mode:  [-] PRVC FiO2 (%):  [50 %] 50 % Set Rate:  [15 bmp] 15 bmp Vt Set:  [450 mL] 450 mL PEEP:  [5 cmH20] 5 cmH20 Plateau Pressure:  [13 cmH20] 13 cmH20 INTAKE / OUTPUT: No intake or output data in the 24 hours ending 04/21/14  2119  PHYSICAL EXAMINATION: GeneralFrail cachectic female looks critically ill Neuro: Having intermittent status epilepticus on the left side clonic movements. She is unarousable with RASS sedation score equivalent-4 but has a gag HEENT: Neck is supple no neck nodes pupils equal and reactive to light Cardiovascular: Normal heart sounds normotensive Lungs: Clear to auscultation bilaterally poor airway protection Abdomen: Soft, no mass normal bowel sounds Musculoskeletal: No cyanosis no clubbing no pedal edema Skin:  Appears intact anteriorly LABS: PULMONARY No results for input(s): PHART, PCO2ART, PO2ART, HCO3, TCO2, O2SAT in the last 168 hours.  Invalid input(s): PCO2, PO2  CBC  Recent Labs Lab 04/17/14 1036 04/19/14 0321 04/20/14 0235  HGB 11.9* 12.0 11.5*  HCT 34.8* 35.1* 33.7*  WBC 7.7 11.9* 8.0  PLT 176 226 209    COAGULATION  Recent Labs Lab 04/17/14 0155  INR 1.16    CARDIAC   Recent Labs Lab 04/17/14 0155 04/17/14 0810 04/17/14 1430  TROPONINI <0.30 <0.30 <0.30   No results for input(s): PROBNP in the last 168 hours.   CHEMISTRY  Recent Labs  Lab 04/17/14 1036 04/18/14 0024 04/19/14 0321 04/19/14 2010 04/20/14 0235  NA 135* 129* 128* 126* 130*  K 4.0 4.0 4.1 4.1 4.2  CL 97 93* 91* 88* 93*  CO2 22 22 22 21 21   GLUCOSE 155* 169* 196* 210* 212*  BUN 11 11 13 18 19   CREATININE 0.65 0.66 0.62 0.64 0.62  CALCIUM 8.6 8.4 8.8 8.9 8.9  MG 1.1*  --  1.4*  --   --   PHOS 3.0  --   --   --   --    Estimated Creatinine Clearance: 48.8 mL/min (by C-G formula based on Cr of 0.62).   LIVER  Recent Labs Lab 04/17/14 0155 04/17/14 1036 04/19/14 0321 04/20/14 0235  AST 26 20 15 17   ALT 22 19 17 15   ALKPHOS 54 55 61 60  BILITOT 0.3 0.6 0.6 0.5  PROT 6.6 6.5 6.9 6.9  ALBUMIN 3.5 3.4* 3.4* 3.2*  INR 1.16  --   --   --      INFECTIOUS No results for input(s): LATICACIDVEN, PROCALCITON in the last 168 hours.   ENDOCRINE CBG (last 3)    Recent Labs  04/21/14 1232 04/21/14 1526 04/21/14 1928  GLUCAP 163* 172* 175*         IMAGING x48h Mr Brain Wo Contrast  04/20/2014   CLINICAL DATA:  80 year old female with subdural hematoma, worsening mental status last 24 hr and new onset seizure activity. Initial encounter.  EXAM: MRI HEAD WITHOUT CONTRAST  TECHNIQUE: Multiplanar, multiecho pulse sequences of the brain and surrounding structures were obtained without intravenous contrast.  COMPARISON:  Head CTs without contrast 04/19/2014 and earlier.  FINDINGS: Lobulated right parafalcine subdural hematoma re - identified, measuring up to 12 mm thickness on coronal images. Smaller more widespread peripheral right subdural hematoma measures up to 4-5 mm thickness throughout. Associated mass effect on the right hemisphere with minimal leftward midline shift, no generalized right hemisphere edema.  Small volume of subdural hematoma also layering in the right posterior fossa measuring up to 5 mm in thickness and also wrapping under the right cerebellar tonsil (series 4, image 2).  Susceptibility artifact on diffusion-weighted imaging related to the blood products, but slightly more generalized gyral signal abnormality on Treitz diffusion along the posterior right cingulate (series 6, image 14) and subtle evidence of gyral edema at that same level on coronal T2 series 10, image 9.  There is also a separate 6 mm focus of restricted diffusion in the left thalamus with mild associated T2 and FLAIR hyperintensity. No associated hemorrhage or mass effect at that site. Furthermore there is a punctate area of increased trace diffusion in the medial left occipital lobe, less certain corresponding restricted diffusion on ADC at this site.  No posterior fossa restricted diffusion identified. Major intracranial vascular flow voids are preserved, with a degree of intracranial artery dolichoectasia.  No intraventricular hemorrhage. Partial effacement of the  right lateral ventricle, no ventriculomegaly. Patchy and confluent cerebral white matter T2 and FLAIR hyperintensity in both hemispheres. No cortical encephalomalacia identified. Chronic lacunar infarct in the right paracentral pons. No definite chronic blood products identified in the brain.  Negative pituitary, cervicomedullary junction and visualized cervical spine. Visible internal auditory structures appear normal. Postoperative changes to the globes. Visualized paranasal sinuses and mastoids are clear. Visualized scalp soft tissues are within normal limits. Normal bone marrow signal.  IMPRESSION: 1. Subdural hematoma re - identified in the right hemisphere and stable from recent CTs, including lobulated component along  the interhemispheric fissure. Small component in the right posterior fossa. 2. Subtle signal abnormality in the posterior right cingulate gyrus adjacent to the parafalcine hematoma in this setting is suggestive of gyral edema due to recent seizure. 3. Superimposed small acute lacunar infarct in the left thalamus. Possible second tiny lacune in the left occipital pole. No hemorrhage or mass effect associated with these. 4. Underlying chronic small vessel disease.   Electronically Signed   By: Lars Pinks M.D.   On: 04/20/2014 16:04       ASSESSMENT / PLAN:  PULMONARY OETT 04/21/2014 Acute respiratory failure due to status epilepticus and  acute encephalopathy P:   Intubate  Full mechanical ventilator support  CARDIOVASCULAR  A:  she has coronary artery disease risk factors and the presence of diabetes and age but MI has been ruled out P:  Monitor   RENAL AHistory of hypomagnesemia this admission but normal renal function  Estimated Creatinine Clearance: 48.8 mL/min (by C-G formula based on Cr of 0.62).  P:   Recheck electrolytes  GASTROINTESTINAL Cachexia with protein calorie malnutrition   P:   NPO NG tube  Start tube feeds for nutrition  HEMATOLOGY At risk for  anemia of critical illness  P:  PRBC for hgb < 7gm%  INFECTIOUS A:  No evidence of infection P:   BMonitor off antibiotics  ENDORINE A:  DM  P:   ssi  NEUROLOGIC A:  Status epilepticus with acute encepphalopathy and coma P:   Goal seizure free on eEG  Diprivan gtt per sedation PAD protocl 3 Versed prn Anti-epileptics per neuro   FAMILY  - Updates:  No family at bedside 04/21/2014 but per RN daughter aware of deterioration   - Inter-disciplinary family meet or Palliative Care meeting due by:  04/28/14 which wkll be 7 days in ICU   CODE     Code Status Orders        Start     Ordered   04/17/14 0856  Full code   Continuous     04/17/14 0855        TODAY'S SUMMARY: status epilepticus due to [facility], Intubated for diprivan. PCCM primary     The patient is critically ill with multiple organ systems failure and requires high complexity decision making for assessment and support, frequent evaluation and titration of therapies, application of advanced monitoring technologies and extensive interpretation of multiple databases.   Critical Care Time devoted to patient care services described in this note is  35  Minutes. This time reflects time of care of this signee Dr Brand Males. This critical care time does not reflect procedure time, or teaching time or supervisory time of PA/NP/Med student/Med Resident etc but could involve care discussion time    Dr. Brand Males, M.D., [facility].C.P Pulmonary and Critical Care Medicine Staff Physician [facility] Pager: [phone], If no answer or between  15:00h - 7:00h: call [phone]  04/21/2014 9:35 PM

## 2014-04-21 NOTE — Progress Notes (Signed)
  Echocardiogram 2D Echocardiogram has been performed.  Diane Stevenson 04/21/2014, 2:18 PM

## 2014-04-21 NOTE — Progress Notes (Signed)
Patients daughter states the patient is "having a seizure."  Upon assessment patient appears to be experiencing localized seizure in the left leg.  Patients daughter states the seizure started at 1359 seizure activity stopped at 1401.  At this time the patients daughter notified me that the patient had a previous seizure at 1315 that lasted thirty seconds.  I continued to reinforce previous education with the daughter about the importance of notifying staff immediately with any seizure activity.  Patients daughter verbalized understanding at these instructions and will continue to follow up.

## 2014-04-21 NOTE — Progress Notes (Signed)
[facility] TEAM 1 - Stepdown/ICU TEAM Progress Note  Diane Stevenson LTJ:[id number] DOB: 1929/07/20 DOA: 04/16/2014 PCP: Irven Shelling, MD  Admit HPI / Brief Narrative: 80 yo female w/ a history of breast cancer (08/04/1995); hypertension; DM; GERD; and DJD who presented after a loss of balance and a fall backwards, during which she hit the back of her head on the floor and the wall. Patient did not loose consciousness. Patient was brought to [facility] ER and CT showed acute subdural hematoma extending along the entirety of the right interhemispheric fissure and right tentorium as well as an acute compression fracture of the T1 vertebral body with no retropulsion. Also noted was a shallow subdural hematoma along the right frontal lobe. The case was discussed at length with Dr. Ellene Route with Neurosurgery who believed the patient would not require operative intervention.  Pt experienced an acute change in her mental status 11/24 night, prompting a repeat CT head which noted expanding SDH.   HPI/Subjective: 11/26 the pt began to experience seizures. She is sedate at the time of my exam today.     Assessment/Plan:  Interhemispheric subdural hematoma Stable on f/u CT initially, but repeat CT 11/24 PM noted progression of falcine SDH with slight R>L shift, coincident w/ MS changes - NS continues to follow and surgery not indicated at this time  Acute encephalopathy Due to Sz, SDH, and ativan required due to Sz activity   Seizures  2 episodes of witnessed seizures by family and RN 11/26 - Neurology following - sz meds initiated   Acute T1 Compression fx Medical management - PT/OT consult  Nondisplaced right 11th rib fracture Medical management only   Multiple subacute spinal fractures consistent with frequent falls (T1 T6 and T10) Dependent upon PT/OT evaluation patient may require SNF or assisted living  Group B Strep UTI Cont abx tx for prolonged tx course (7-10 days)  HTN avoid  overaggressive correction at this time   Diabetes A1c 6.6 - CBG reasonably controlled at this time  Hyponatremia Review of old records reveals this is a chronic problem, w/ baseline Na ~130 - urine studies c/w SIADH - Na 130 yesterday - cont to follow   Code Status: FULL Family Communication: spoke w/ daughter at bedside at length  Disposition Plan: SDU  Consultants: NS Neurology   Procedures: none  Antibiotics: Rocephin 11/23 >  DVT prophylaxis: SCDs only   Objective: Blood pressure 160/81, pulse 88, temperature 99 F (37.2 C), temperature source Axillary, resp. rate 39, height 5\' 5"  (1.651 m), weight 66.2 kg (145 lb 15.1 oz), SpO2 97 %.  Intake/Output Summary (Last 24 hours) at 04/21/14 1141 Last data filed at 04/20/14 1556  Gross per 24 hour  Intake      4 ml  Output      0 ml  Net      4 ml   Exam: General: No acute respiratory distress Lungs: Clear to auscultation bilaterally without wheezes or crackles Cardiovascular: Regular rate and rhythm without murmur gallop or rub Abdomen: Nontender, nondistended, soft, bowel sounds positive, no rebound, no ascites, no appreciable mass Extremities: No significant cyanosis, clubbing, or edema bilateral lower extremities Neuro:  Unresponsive    Data Reviewed: Basic Metabolic Panel:  Recent Labs Lab 04/17/14 1036 04/18/14 0024 04/19/14 0321 04/19/14 2010 04/20/14 0235  NA 135* 129* 128* 126* 130*  K 4.0 4.0 4.1 4.1 4.2  CL 97 93* 91* 88* 93*  CO2 22 22 22 21 21   GLUCOSE 155* 169* 196*  210* 212*  BUN 11 11 13 18 19   CREATININE 0.65 0.66 0.62 0.64 0.62  CALCIUM 8.6 8.4 8.8 8.9 8.9  MG 1.1*  --  1.4*  --   --   PHOS 3.0  --   --   --   --     Liver Function Tests:  Recent Labs Lab 04/17/14 0155 04/17/14 1036 04/19/14 0321 04/20/14 0235  AST 26 20 15 17   ALT 22 19 17 15   ALKPHOS 54 55 61 60  BILITOT 0.3 0.6 0.6 0.5  PROT 6.6 6.5 6.9 6.9  ALBUMIN 3.5 3.4* 3.4* 3.2*    Coags:  Recent Labs Lab  04/17/14 0155  INR 1.16    Recent Labs Lab 04/17/14 0155  APTT 28    CBC:  Recent Labs Lab 04/17/14 0155 04/17/14 1036 04/19/14 0321 04/20/14 0235  WBC 8.7 7.7 11.9* 8.0  NEUTROABS 6.4  --  10.1*  --   HGB 11.8* 11.9* 12.0 11.5*  HCT 34.7* 34.8* 35.1* 33.7*  MCV 96.7 98.6 95.9 95.5  PLT 200 176 226 209    Cardiac Enzymes:  Recent Labs Lab 04/17/14 0155 04/17/14 0810 04/17/14 1430  TROPONINI <0.30 <0.30 <0.30   CBG:  Recent Labs Lab 04/20/14 1547 04/20/14 2025 04/21/14 0024 04/21/14 0421 04/21/14 0801  GLUCAP 159* 150* 161* 132* 131*    Recent Results (from the past 240 hour(s))  Culture, Urine     Status: None   Collection Time: 04/17/14  2:26 AM  Result Value Ref Range Status   Specimen Description URINE, CLEAN CATCH  Final   Special Requests NONE  Final   Culture  Setup Time   Final    04/17/2014 10:00 Performed at [facility]   Final    75,000 COLONIES/ML Performed at [facility]    Culture   Final    GROUP B STREP(S.AGALACTIAE)ISOLATED Note: TESTING AGAINST S. AGALACTIAE NOT ROUTINELY PERFORMED DUE TO PREDICTABILITY OF AMP/PEN/VAN SUSCEPTIBILITY. Performed at [facility]    Report Status 04/18/2014 FINAL  Final  MRSA PCR Screening     Status: None   Collection Time: 04/17/14 12:06 PM  Result Value Ref Range Status   MRSA by PCR NEGATIVE NEGATIVE Final    Comment:        The GeneXpert MRSA Assay (FDA approved for NASAL specimens only), is one component of a comprehensive MRSA colonization surveillance program. It is not intended to diagnose MRSA infection nor to guide or monitor treatment for MRSA infections.      Studies:  Recent x-ray studies have been reviewed in detail by the Attending Physician  Scheduled Meds:  Scheduled Meds: . antiseptic oral rinse  7 mL Mouth Rinse q12n4p  . cefTRIAXone (ROCEPHIN)  IV  1 g Intravenous Q24H  . chlorhexidine  15 mL Mouth Rinse BID  .  folic acid  1 mg Intravenous Daily  . insulin aspart  0-20 Units Subcutaneous 6 times per day  . labetalol  5 mg Intravenous Q4H  . levETIRAcetam  500 mg Intravenous Q12H  . sodium chloride  3 mL Intravenous Q12H  . thiamine IV  100 mg Intravenous Daily    Time spent on care of this patient: 35 mins   Jaquan Sadowsky T , MD   [facility] Office  [phone] Pager - Text Page per Shea Evans as per below:  On-Call/Text Page:      [email or web address]      password [email or web address]  If 7PM-7AM,  please contact night-coverage [email or web address] Password [email or web address] 04/21/2014, 11:41 AM   LOS: 5 days

## 2014-04-21 NOTE — Progress Notes (Signed)
LTVM EEG restarted after pt transferred to 3M08. Dr Nicole Kindred in neurology made aware.

## 2014-04-21 NOTE — Procedures (Signed)
Intubation Procedure Note Diane Stevenson [id number] Oct 13, 1929  Procedure: Intubation Indications: Airway protection and maintenance  Procedure Details Consent: Risks of procedure as well as the alternatives and risks of each were explained to the (patient/caregiver).  Consent for procedure obtained. Time Out: Verified patient identification, verified procedure, site/side was marked, verified correct patient position, special equipment/implants available, medications/allergies/relevent history reviewed, required imaging and test results available.  Performed  Maximum sterile technique was used including cap, gloves, gown, hand hygiene and mask.  MAC  glidescope used - easy intubation. No paralytic used  Evaluation Hemodynamic Status: BP stable throughout; O2 sats: stable throughout Patient's Current Condition: stable Complications: No apparent complications Patient did tolerate procedure well. Chest X-ray ordered to verify placement.  CXR: pending.  sedaton use: fent 124mcg, versed 2mg  and etomidate 20mg  IV x 1  Dr. Brand Males, M.D., [facility].C.P Pulmonary and Critical Care Medicine Staff Physician [facility] Pager: [phone], If no answer or between  15:00h - 7:00h: call [phone]  04/21/2014 9:06 PM

## 2014-04-21 NOTE — Progress Notes (Signed)
Called by EEG tech regarding concerning EEG changes and patient having a clinical seizure at the time of the procedure. c-EEG showing several electrographic seizures emanating from the right hemisphere. She remains unresponsive. Loaded with IV vimpat 200 mg, increased dose of keppra, and decided to call critical care attending to intubate patient, start propofol, and transfer patient to NICU. Will continue to follow.  Dorian Pod, MD

## 2014-04-22 ENCOUNTER — Inpatient Hospital Stay (HOSPITAL_COMMUNITY): Payer: Medicare Other

## 2014-04-22 DIAGNOSIS — R40243 Glasgow coma scale score 3-8: Secondary | ICD-10-CM

## 2014-04-22 DIAGNOSIS — I6330 Cerebral infarction due to thrombosis of unspecified cerebral artery: Secondary | ICD-10-CM | POA: Insufficient documentation

## 2014-04-22 DIAGNOSIS — J9601 Acute respiratory failure with hypoxia: Secondary | ICD-10-CM

## 2014-04-22 LAB — BLOOD GAS, ARTERIAL
Acid-base deficit: 3.3 mmol/L — ABNORMAL HIGH (ref 0.0–2.0)
BICARBONATE: 20.2 meq/L (ref 20.0–24.0)
DRAWN BY: 39899
FIO2: 0.4 %
LHR: 15 {breaths}/min
MECHVT: 450 mL
O2 SAT: 99.1 %
PCO2 ART: 30.1 mmHg — AB (ref 35.0–45.0)
PEEP/CPAP: 5 cmH2O
PH ART: 7.441 (ref 7.350–7.450)
PO2 ART: 128 mmHg — AB (ref 80.0–100.0)
Patient temperature: 98.6
TCO2: 21.1 mmol/L (ref 0–100)

## 2014-04-22 LAB — BASIC METABOLIC PANEL
ANION GAP: 16 — AB (ref 5–15)
BUN: 20 mg/dL (ref 6–23)
CALCIUM: 8.2 mg/dL — AB (ref 8.4–10.5)
CO2: 19 mEq/L (ref 19–32)
CREATININE: 0.49 mg/dL — AB (ref 0.50–1.10)
Chloride: 99 mEq/L (ref 96–112)
GFR calc Af Amer: >90 mL/min (ref >90)
GFR calc non Af Amer: 88 mL/min — ABNORMAL LOW (ref >90)
Glucose, Bld: 192 mg/dL — ABNORMAL HIGH (ref 70–99)
Potassium: 3.8 mEq/L (ref 3.7–5.3)
SODIUM: 134 meq/L — AB (ref 137–147)

## 2014-04-22 LAB — PRO B NATRIURETIC PEPTIDE: Pro B Natriuretic peptide (BNP): 727.4 pg/mL — ABNORMAL HIGH (ref 0–450)

## 2014-04-22 LAB — PHOSPHORUS: Phosphorus: 2.4 mg/dL (ref 2.3–4.6)

## 2014-04-22 LAB — GLUCOSE, CAPILLARY
GLUCOSE-CAPILLARY: 137 mg/dL — AB (ref 70–99)
GLUCOSE-CAPILLARY: 236 mg/dL — AB (ref 70–99)
Glucose-Capillary: 105 mg/dL — ABNORMAL HIGH (ref 70–99)
Glucose-Capillary: 135 mg/dL — ABNORMAL HIGH (ref 70–99)
Glucose-Capillary: 149 mg/dL — ABNORMAL HIGH (ref 70–99)

## 2014-04-22 LAB — LACTIC ACID, PLASMA: Lactic Acid, Venous: 1.2 mmol/L (ref 0.5–2.2)

## 2014-04-22 LAB — CK TOTAL AND CKMB (NOT AT ARMC)
CK TOTAL: 25 U/L (ref 7–177)
CK, MB: <1 ng/mL (ref 0.3–4.0)

## 2014-04-22 LAB — MAGNESIUM: Magnesium: 1.7 mg/dL (ref 1.5–2.5)

## 2014-04-22 MED ORDER — VITAL HIGH PROTEIN PO LIQD
1000.0000 mL | ORAL | Status: DC
  Filled 2014-04-22 (×2): qty 1000

## 2014-04-22 MED ORDER — VITAL HIGH PROTEIN PO LIQD
1000.0000 mL | ORAL | Status: DC
  Administered 2014-04-22 – 2014-05-01 (×11): 1000 mL
  Filled 2014-04-22 (×12): qty 1000

## 2014-04-22 MED ORDER — INSULIN ASPART 100 UNIT/ML ~~LOC~~ SOLN
0.0000 [IU] | SUBCUTANEOUS | Status: DC
  Administered 2014-04-22: 2 [IU] via SUBCUTANEOUS
  Administered 2014-04-22 – 2014-04-23 (×6): 5 [IU] via SUBCUTANEOUS
  Administered 2014-04-23: 8 [IU] via SUBCUTANEOUS
  Administered 2014-04-23 – 2014-04-24 (×5): 5 [IU] via SUBCUTANEOUS
  Administered 2014-04-24: 8 [IU] via SUBCUTANEOUS
  Administered 2014-04-25 (×2): 5 [IU] via SUBCUTANEOUS
  Administered 2014-04-25 (×2): 8 [IU] via SUBCUTANEOUS
  Administered 2014-04-25 (×3): 5 [IU] via SUBCUTANEOUS
  Administered 2014-04-26: 8 [IU] via SUBCUTANEOUS
  Administered 2014-04-26 (×2): 5 [IU] via SUBCUTANEOUS
  Administered 2014-04-26: 8 [IU] via SUBCUTANEOUS

## 2014-04-22 MED ORDER — FAMOTIDINE 40 MG/5ML PO SUSR
20.0000 mg | Freq: Two times a day (BID) | ORAL | Status: DC
  Administered 2014-04-22 – 2014-04-30 (×17): 20 mg
  Filled 2014-04-22 (×20): qty 2.5

## 2014-04-22 NOTE — Progress Notes (Signed)
LTVM EEG restarted. Dr. Armida Sans is aware.

## 2014-04-22 NOTE — Progress Notes (Signed)
INITIAL NUTRITION ASSESSMENT  DOCUMENTATION CODES Per approved criteria  -Not Applicable   INTERVENTION: Initiate Vital High protein @ 20 ml/hr via OGT and increase by 10 ml every 4 hours to goal rate of 50 ml/hr.   Tube feeding regimen provides 1200 kcal (87% of needs), 105 grams of protein, and 1008 ml of H2O. TF plus current rate of propofol will provide 1353 kcal (98% of estimated energy needs).    NUTRITION DIAGNOSIS: Inadequate oral intake related to inability to eat as evidenced by NPO/Vent status.   Goal: Pt to meet >/= 90% of their estimated nutrition needs   Monitor:  TF initiation/tolerance, vent status, weight trend, labs  Reason for Assessment: Consult for TF initiation/management  80 y.o. female  Admitting Dx: SDH  ASSESSMENT: 80 year old female who is cachectic and multiple medical problems including breast cancer 1997 and type 2 diabetes sustained subdural hematoma in the right frontal lobe FALCINE SDH after a fall on 04/16/2014. In addition she also sustained 3 fractures of T1, T6 and T10 above her kyphoplasty's at T11-T12 along with nondisplaced right 11th rib fracture. She was being medically managed on the floor then and all along she had some intermittent confusion on the 2015 she had acute mental status change and CT head showed expansion of the subdural hematoma.  status epilepticus due to [facility], Intubated for diprivan.   Per family report, the last food patient had was on Wednesday. She has been took weak to feed herself and has needed assistance. OGT in place to suction with about 100 ml output at time of visit.  Labs: low sodium, low calcium  Patient is currently intubated on ventilator support MV: 6.8 L/min Temp (24hrs), Avg:98.8 F (37.1 C), Min:98 F (36.7 C), Max:99.9 F (37.7 C)  Propofol: 5.8 ml/hr (Provides 153 kcal per 24 hours)   Height: Ht Readings from Last 1 Encounters:  04/21/14 5\' 5"  (1.651 m)    Weight: Wt Readings from Last 1  Encounters:  04/17/14 145 lb 15.1 oz (66.2 kg)    Ideal Body Weight: 125 lbs  % Ideal Body Weight: 116%  Wt Readings from Last 10 Encounters:  04/17/14 145 lb 15.1 oz (66.2 kg)  08/04/13 133 lb (60.328 kg)  07/22/13 137 lb 3.2 oz (62.234 kg)  06/15/13 137 lb (62.143 kg)  06/09/13 138 lb (62.596 kg)  01/26/13 147 lb 6 oz (66.849 kg)  08/13/11 149 lb 14.6 oz (68 kg)  07/23/11 153 lb 3.2 oz (69.491 kg)    Usual Body Weight: unknown  % Usual Body Weight: NA  BMI:  Body mass index is 24.29 kg/(m^2).  Estimated Nutritional Needs: Kcal: 1378 Protein: 90-105 grams Fluid: 1.7-1.9 L/day  Skin: intact  Diet Order: Diet NPO time specified  EDUCATION NEEDS: -No education needs identified at this time   Intake/Output Summary (Last 24 hours) at 04/22/14 0926 Last data filed at 04/22/14 0800  Gross per 24 hour  Intake  772.7 ml  Output    475 ml  Net  297.7 ml    Last BM: 11/26  Labs:   Recent Labs Lab 04/17/14 1036  04/19/14 0321  04/20/14 0235 04/21/14 2221 04/22/14 0010  NA 135*  < > 128*  < > 130* 133* 134*  K 4.0  < > 4.1  < > 4.2 4.0 3.8  CL 97  < > 91*  < > 93* 97 99  CO2 22  < > 22  < > 21 20 19   BUN 11  < >  13  < > 19 18 20   CREATININE 0.65  < > 0.62  < > 0.62 0.54 0.49*  CALCIUM 8.6  < > 8.8  < > 8.9 8.5 8.2*  MG 1.1*  --  1.4*  --   --  1.7 1.7  PHOS 3.0  --   --   --   --  2.6 2.4  GLUCOSE 155*  < > 196*  < > 212* 187* 192*  < > = values in this interval not displayed.  CBG (last 3)   Recent Labs  04/21/14 2321 04/22/14 0329 04/22/14 0759  GLUCAP 181* 137* 105*    Scheduled Meds: . antiseptic oral rinse  7 mL Mouth Rinse QID  . cefTRIAXone (ROCEPHIN)  IV  1 g Intravenous Q24H  . chlorhexidine  15 mL Mouth Rinse BID  . famotidine (PEPCID) IV  20 mg Intravenous Q12H  . feeding supplement (VITAL HIGH PROTEIN)  1,000 mL Per Tube Q24H  . folic acid  1 mg Intravenous Daily  . insulin aspart  0-20 Units Subcutaneous 6 times per day  .  labetalol  5 mg Intravenous Q4H  . lacosamide (VIMPAT) IV  100 mg Intravenous Q12H  . levETIRAcetam  1,000 mg Intravenous Q12H  . sodium chloride  3 mL Intravenous Q12H  . thiamine IV  100 mg Intravenous Daily    Continuous Infusions: . sodium chloride 40 mL/hr at 04/21/14 2116  . propofol 15 mcg/kg/min (04/22/14 0825)    Past Medical History  Diagnosis Date  . Breast cancer 08/04/1995    left  . Hypertension   . Type 2 diabetes mellitus   . Dyspareunia   . Diabetes mellitus   . GERD (gastroesophageal reflux disease)   . DJD (degenerative joint disease)     back, R arm & wrist, hands     Past Surgical History  Procedure Laterality Date  . Appendectomy    . Breast surgery  1998     Left Breast  . Breast surgery  1993    Right Breast  . Breast surgery  1997    Left Breast  . Partial hip arthroplasty  1998    Left side  . Foot arthrodesis, modified mcbride      1984  . Bone spur      1984  . Joint replacement      rt knee  . Total hip arthroplasty  08/20/2011    Procedure: TOTAL HIP ARTHROPLASTY;  Surgeon: Kerin Salen, MD;  Location: [place];  Service: Orthopedics;  Laterality: Right;  DEPUY/PINNACLE CUP,SUMMIT BASIC STEM  . Eye surgery      cataracts removed, both eyes   . Fracture surgery      /L wrist- plate- 2004  . Kyphoplasty N/A 01/27/2013    Procedure: THORACIC ELEVEN, THORACIC TWELVE KYPHOPLASTY;  Surgeon: Winfield Cunas, MD;  Location: [place] [facility];  Service: Neurosurgery;  Laterality: N/A;  T11 and T12 kyphoplasty  . Open reduction internal fixation (orif) distal radial fracture Right 06/09/2013    Procedure: OPEN REDUCTION INTERNAL FIXATION (ORIF) DISTAL RADIAL FRACTURE;  Surgeon: Linna Hoff, MD;  Location: [place];  Service: Orthopedics;  Laterality: Right;    Pryor Ochoa RD, LDN Inpatient Clinical Dietitian Pager: [phone] After Hours Pager: [phone]

## 2014-04-22 NOTE — Procedures (Addendum)
CONTINUOUS VIDEO-EEG WITH MONITORING (24 HR) [[id number]]  Patient: IVELIZ GARAY DOB:  [birth year]   Date of Recording: 04/23/2014 7:30 AM - 04/24/2014 7:30AM Requesting Provider: Antony Contras, MD  Brief History: Per EEG technician's notes, the patient is an 80 y.o. female with a past medical history significant for HTN, DM type II, bilateral breast cancer, DJD, admitted to [facility] due to right hemispheric and right tentorium post traumatic SDH after a fall who has been experiencing fluctuating mental state changes and new onset seizures.  Technical Description: The study consists of a continuous 24-hr digital video-EEG recording with 21 electrodes placed according to the International 10-20 System; additional leads are not specified, but did include an EKG lead. Activation procedures (hyperventilation/photic stimulation) were not included.  Electrographic Description: The background activity in this tracing consists of polymorphic delta and theta activity, with intermittent overriding beta activity. There was hemispheric asymmetry with higher amplitudes seen on the left. Intermittently, spikes were seen in the right frontal region, phase reversing at [facility] and F8 in the bipolar longitudinal montage. Left frontotemporal sharp-waves, often semi-periodic, and occasional left posterior temporal-parietal spike-waves discharges were also seen. Some of the left frontal sharp waves had triphasic appearance. No Electrographic seizures were seen.  Interpretation: This is an abnormal 24 hr video-EEG recording. Significant findings include: 1. No further seizures noted  2. Right fronto-temporal spike wave discharges 3.  Multi-regional spike-wave and sharp-wave discharges on the left (left frontocentral, left frontotemporal and posterior temporal-parietal, primarily) 4. Moderate diffuse slowing, polymorphic delta/theta  Clinical Correlation: The findings document no further electrographic seizures. There is evidence  of cortical irritability seen in the bilateral hemispheres as described above. There is also moderate diffuse encephalopathy.   CONTINUOUS VIDEO-EEG WITH MONITORING (24 HR) [[id number]]  Patient: EFRAT ZUIDEMA DOB:  Oct 01, 1929   Date of Recording: 11/28-29/2015 Requesting Provider: Antony Contras, MD  Brief History: Per EEG technician's notes, the patient is an 80 y.o. female with a past medical history significant for HTN, DM type II, bilateral breast cancer, DJD, admitted to [facility] due to right hemispheric and right tentorium post traumatic SDH after a fall who has been experiencing fluctuating mental state changes and new onset seizures.  Medications: Scheduled Meds: . antiseptic oral rinse  7 mL Mouth Rinse QID  . chlorhexidine  15 mL Mouth Rinse BID  . famotidine  20 mg Per Tube BID  . feeding supplement (VITAL HIGH PROTEIN)  1,000 mL Per Tube Q24H  . folic acid  1 mg Intravenous Daily  . insulin aspart  0-15 Units Subcutaneous 6 times per day  . labetalol  5 mg Intravenous Q4H  . lacosamide (VIMPAT) IV  100 mg Intravenous Q12H  . levETIRAcetam  1,000 mg Intravenous Q12H  . sodium chloride  3 mL Intravenous Q12H  . thiamine IV  100 mg Intravenous Daily   Continuous Infusions: . sodium chloride 40 mL/hr at 04/23/14 0606  . propofol Stopped (04/22/14 1112)   PRN Meds:.[DISCONTINUED] acetaminophen **OR** acetaminophen, fentaNYL, hydrALAZINE, LORazepam  Technical Description: The study consists of a continuous 24-hr digital video-EEG recording with 21 electrodes placed according to the International 10-20 System; additional leads are not specified, but did include an EKG lead. Activation procedures (hyperventilation/photic stimulation) were not included.  Electrographic Description: The recording was initially characterized by a minimal degree of burst suppression, with PLEDs occurring in short runs, phase-reversing over F4, and moderate slowing on the left (low voltage polymorphic  theta/delta)  phase-reversing over the right frontal leads (maximal over F4).  This gave way to a continuous background within the first 1-2 hours. From here to the end of the recording, the background on the right remained essentially unchanged with frequent short runs of right frontal PLEDs (2-5 second runs of 1 Hz sharp-wave discharges maximal over F4), separated by continuous moderate slowing (polymorphic theta/delta). No further seizures were recorded on the right.  The left hemisphere however is showing much more irritability than in the previous 24 hrs. Frequent left frontotemporal sharp-waves, often semi-periodic, and occasional left posterior temporal-parietal spike-waves discharges were noted; and at times the slowing on the left appeared to evolved into rhythmic activity. The rhythmic slowing over the left temporal chain was often sem-rhythmic 1 Hz delta; while the rhythmic slowing over the left central chain was more commonly in the form of PLEDs.   Interpretation: This is an abnormal 24 hr video-EEG recording. Significant findings include: 1. No further seizures noted over the right hemisphere 2. Right frontal periodic epileptiform discharges (PLEDS) 3. Multi-regional spike-wave and sharp-wave discharges on the left (left frontocentral, left frontotemporal and posterior temporal-parietal, primarily) 4. Intermittent slowing, left temporal semi-rhythmic delta 5. Intermittent PLEDs, left frontocentral 6. Moderate diffuse slowing, polymorphic delta/theta  Clinical Correlation: The findings document partial nonconvulsive status epilepticus (NCSE) over the right hemisphere has been controlled for approximately 30 hrs. However, the left hemisphere has developed multi-regional cortical irritability with epileptogenic potential, and at times, there are ambiguous features on the left raising a concern for additional focal nonconvulsive seizure activity there as well. Further clinical correlation  recommended.   Reading MD: Erin Fulling Date: 04/23/2014 Time: 1:07 PM     CONTINUOUS VIDEO-EEG WITH MONITORING (24 HR) [[id number]]  Patient: CLETA HEATLEY DOB:  03-14-1930   Date of Recording: 11/27-28/2015 Requesting Provider: Antony Contras, MD  Brief History: Per EEG technician's notes, the patient is an 80 y.o. female with a past medical history significant for HTN, DM type II, bilateral breast cancer, DJD, admitted to [facility] due to right hemispheric and right tentorium post traumatic SDH after a fall who has been experiencing fluctuating mental state changes and new onset seizures.  Medications: Scheduled Meds: . antiseptic oral rinse  7 mL Mouth Rinse QID  . cefTRIAXone (ROCEPHIN)  IV  1 g Intravenous Q24H  . chlorhexidine  15 mL Mouth Rinse BID  . famotidine (PEPCID) IV  20 mg Intravenous Q12H  . feeding supplement (VITAL HIGH PROTEIN)  1,000 mL Per Tube Q24H  . folic acid  1 mg Intravenous Daily  . insulin aspart  0-20 Units Subcutaneous 6 times per day  . labetalol  5 mg Intravenous Q4H  . lacosamide (VIMPAT) IV  100 mg Intravenous Q12H  . levETIRAcetam  1,000 mg Intravenous Q12H  . sodium chloride  3 mL Intravenous Q12H  . thiamine IV  100 mg Intravenous Daily   Continuous Infusions: . sodium chloride 40 mL/hr at 04/21/14 2116  . propofol Stopped (04/22/14 1112)   Technical Description: The study consists of a continuous 24-hr digital video-EEG recording with 21 electrodes placed according to the International 10-20 System; additional leads are not specified, but did include an EKG lead. Activation procedures (hyperventilation/photic stimulation) were not included.  Electrographic Description: The recording was initially characterized by moderate slowing on the left (low voltage polymorphic theta/delta) and suppression on the right, with frequent runs of PLED-like discharges on the right, phase-reversing over the right frontal leads (maximal over F4). These evolved  into electrographic seizures several times per hour, typically lasting 30-120 seconds  and almost always remaining right lateralized. Occasional focal seizures also arose from Cz-Pz. Rarely, some rhythmic movements of the left leg could be seen under the covers associated with the more prolonged seizures which spread out to involve more leads. The patient continued to have recurrent focal right lateralized seizures until approximately midnight of 05/22/2014, at which point, the seizures resolved and the background on the right gave way to burst suppression. The bursts were typically 2-5 seconds and included the previously described right frontal PLED-like discharges, with approximately equal or slightly longer periods of suppression (overall about 50-70% suppressed). Toward the end of the recording the periods of suppression shortened, so that the overall suppression was closer to 30-50% suppression.  Interpretation: This is an abnormal 24 hr video-EEG recording. Significant findings include: 1. Recurrent focal seizures, primarily nonconvulsive, arising from from the right frontal lobe; none after midnight (04/22/2014) 2. Right frontal periodic epileptiform discharges (PLEDS) 3. Diffuse slowing on the left, low voltage polymorphic theta/delta 4. Suppression, right hemisphere  Clinical Correlation: The findings document partial nonconvulsive status epilepticus (NCSE) which has been controlled since midnight. They also indicate a moderate diffuse encephalopathy with focal features suggesting greater dysfunction over the left hemisphere. The persistence of the asymmetry suggests a structural basis which would be consistent with the history of right subdural hematoma, but probably also reflects in part a postictal slowing associated with prolonged focal seizure activity.

## 2014-04-22 NOTE — Plan of Care (Signed)
Problem: Phase I Progression Outcomes Goal: VTE prophylaxis Outcome: Completed/Met Date Met:  04/22/14 Goal: GIProphysixis Outcome: Completed/Met Date Met:  04/22/14

## 2014-04-22 NOTE — Progress Notes (Signed)
VASCULAR LAB PRELIMINARY  PRELIMINARY  PRELIMINARY  PRELIMINARY  Carotid duplex completed.    Preliminary report:  Bilateral:  1-39% ICA stenosis.  Vertebral artery flow is antegrade.     Diane Stevenson, RVS 04/22/2014, 11:34 AM

## 2014-04-22 NOTE — Progress Notes (Signed)
PULMONARY / CRITICAL CARE MEDICINE   Name: REBEKKAH POWLESS MRN: [id number] DOB: [birth year]   LOS 6 days PCP Irven Shelling, MD   ADMISSION DATE:  04/16/2014 CONSULTATION DATE:  04/21/14  REFERRING MD :  Dr Dorian Pod of neuro and Dr Dlisa Puffer [facility]   INITIAL PRESENTATION:  80 year old female who is cachectic and multiple medical problems including breast cancer 1997 and type 2 diabetes sustained subdural hematoma in the right frontal lobe FALCINE SDH after a fall on 04/16/2014. In addition she also sustained 3 fractures of T1, T6 and T10 above her kyphoplasty's at T11-T12 along with nondisplaced right 11th rib fracture. All this  was deemed nonoperative by neurosurgery and she was admitted to stepdown ICU by the tried hospitalist service. She had acute mental status change and CT head showed expansion of the subdural hematoma. Then on 04/20/2014 she developed in association wit chronic hyponatremia new onset seizures of clonic movements involving the left greater than the right leg and also the left upper extremity. EEG on 04/21/2014 showed status epilepticus despite medical management and patient was moved to the intensive care unit under the critical care medicine service for respiratory compromise, and her tracheal intubation and acute respiratory failure and controlling seizures with DIPRIVAN. PCCM will be primary from 04/21/14   SIGNIFICANT EVENTS: 04/16/2014 - admit 04/21/14 - ICU transfer, PCCM primary and Intubation for diprivan gtt and continuous EEG   SUBJECTIVE:  Sedated on diprivan has cont EEG   VITAL SIGNS: Temp:  [98 F (36.7 C)-99.9 F (37.7 C)] 98.7 F (37.1 C) (11/28 0800) Pulse Rate:  [75-113] 76 (11/28 0813) Resp:  [12-39] 15 (11/28 0813) BP: (98-176)/(53-131) 121/60 mmHg (11/28 0813) SpO2:  [97 %-100 %] 100 % (11/28 0813) FiO2 (%):  [40 %-50 %] 40 % (11/28 0813) HEMODYNAMICS:   VENTILATOR SETTINGS: Vent Mode:  [-] PRVC FiO2 (%):  [40  %-50 %] 40 % Set Rate:  [15 bmp] 15 bmp Vt Set:  [450 mL] 450 mL PEEP:  [5 cmH20] 5 cmH20 Plateau Pressure:  [13 cmH20-15 cmH20] 15 cmH20 INTAKE / OUTPUT:  Intake/Output Summary (Last 24 hours) at 04/22/14 3710 Last data filed at 04/22/14 0800  Gross per 24 hour  Intake  772.7 ml  Output    475 ml  Net  297.7 ml    PHYSICAL EXAMINATION: General: Frail cachectic female sedated on vent Neuro: She is unarousable with RASS sedation score equivalent-4 but still has gag  HEENT: Neck is supple no neck nodes pupils equal and reactive to light Cardiovascular: Normal heart sounds normotensive Lungs: Clear to auscultation bilaterally Abdomen: Soft, no mass normal bowel sounds Musculoskeletal: No cyanosis no clubbing no pedal edema Skin:  Appears intact anteriorly LABS: PULMONARY  Recent Labs Lab 04/21/14 2155  PHART 7.464*  PCO2ART 33.2*  PO2ART 202.0*  HCO3 23.8  TCO2 25  O2SAT 100.0    CBC  Recent Labs Lab 04/19/14 0321 04/20/14 0235 04/21/14 2221  HGB 12.0 11.5* 11.2*  HCT 35.1* 33.7* 32.8*  WBC 11.9* 8.0 7.8  PLT 226 209 210    COAGULATION  Recent Labs Lab 04/17/14 0155  INR 1.16    CARDIAC    Recent Labs Lab 04/17/14 0155 04/17/14 0810 04/17/14 1430  TROPONINI <0.30 <0.30 <0.30    Recent Labs Lab 04/22/14 0010  PROBNP 727.4*     CHEMISTRY  Recent Labs Lab 04/17/14 1036  04/19/14 0321 04/19/14 2010 04/20/14 0235 04/21/14 2221 04/22/14 0010  NA 135*  < > 128*  126* 130* 133* 134*  K 4.0  < > 4.1 4.1 4.2 4.0 3.8  CL 97  < > 91* 88* 93* 97 99  CO2 22  < > 22 21 21 20 19   GLUCOSE 155*  < > 196* 210* 212* 187* 192*  BUN 11  < > 13 18 19 18 20   CREATININE 0.65  < > 0.62 0.64 0.62 0.54 0.49*  CALCIUM 8.6  < > 8.8 8.9 8.9 8.5 8.2*  MG 1.1*  --  1.4*  --   --  1.7 1.7  PHOS 3.0  --   --   --   --  2.6 2.4  < > = values in this interval not displayed. Estimated Creatinine Clearance: 48.8 mL/min (by C-G formula based on Cr of  0.49).   LIVER  Recent Labs Lab 04/17/14 0155 04/17/14 1036 04/19/14 0321 04/20/14 0235  AST 26 20 15 17   ALT 22 19 17 15   ALKPHOS 54 55 61 60  BILITOT 0.3 0.6 0.6 0.5  PROT 6.6 6.5 6.9 6.9  ALBUMIN 3.5 3.4* 3.4* 3.2*  INR 1.16  --   --   --      INFECTIOUS  Recent Labs Lab 04/21/14 2144 04/22/14 0010  LATICACIDVEN 1.4 1.2     ENDOCRINE CBG (last 3)   Recent Labs  04/21/14 2321 04/22/14 0329 04/22/14 0759  GLUCAP 181* 137* 105*    IMAGING x48h Mr Brain Wo Contrast  04/20/2014   CLINICAL DATA:  80 year old female with subdural hematoma, worsening mental status last 24 hr and new onset seizure activity. Initial encounter.  EXAM: MRI HEAD WITHOUT CONTRAST  TECHNIQUE: Multiplanar, multiecho pulse sequences of the brain and surrounding structures were obtained without intravenous contrast.  COMPARISON:  Head CTs without contrast 04/19/2014 and earlier.  FINDINGS: Lobulated right parafalcine subdural hematoma re - identified, measuring up to 12 mm thickness on coronal images. Smaller more widespread peripheral right subdural hematoma measures up to 4-5 mm thickness throughout. Associated mass effect on the right hemisphere with minimal leftward midline shift, no generalized right hemisphere edema.  Small volume of subdural hematoma also layering in the right posterior fossa measuring up to 5 mm in thickness and also wrapping under the right cerebellar tonsil (series 4, image 2).  Susceptibility artifact on diffusion-weighted imaging related to the blood products, but slightly more generalized gyral signal abnormality on Treitz diffusion along the posterior right cingulate (series 6, image 14) and subtle evidence of gyral edema at that same level on coronal T2 series 10, image 9.  There is also a separate 6 mm focus of restricted diffusion in the left thalamus with mild associated T2 and FLAIR hyperintensity. No associated hemorrhage or mass effect at that site. Furthermore  there is a punctate area of increased trace diffusion in the medial left occipital lobe, less certain corresponding restricted diffusion on ADC at this site.  No posterior fossa restricted diffusion identified. Major intracranial vascular flow voids are preserved, with a degree of intracranial artery dolichoectasia.  No intraventricular hemorrhage. Partial effacement of the right lateral ventricle, no ventriculomegaly. Patchy and confluent cerebral white matter T2 and FLAIR hyperintensity in both hemispheres. No cortical encephalomalacia identified. Chronic lacunar infarct in the right paracentral pons. No definite chronic blood products identified in the brain.  Negative pituitary, cervicomedullary junction and visualized cervical spine. Visible internal auditory structures appear normal. Postoperative changes to the globes. Visualized paranasal sinuses and mastoids are clear. Visualized scalp soft tissues are within normal limits. Normal  bone marrow signal.  IMPRESSION: 1. Subdural hematoma re - identified in the right hemisphere and stable from recent CTs, including lobulated component along the interhemispheric fissure. Small component in the right posterior fossa. 2. Subtle signal abnormality in the posterior right cingulate gyrus adjacent to the parafalcine hematoma in this setting is suggestive of gyral edema due to recent seizure. 3. Superimposed small acute lacunar infarct in the left thalamus. Possible second tiny lacune in the left occipital pole. No hemorrhage or mass effect associated with these. 4. Underlying chronic small vessel disease.   Electronically Signed   By: Lars Pinks M.D.   On: 04/20/2014 16:04   Dg Chest Port 1 View  04/22/2014   CLINICAL DATA:  Subsequent evaluation of acute respiratory failure  EXAM: PORTABLE CHEST - 1 VIEW  COMPARISON:  04/21/2014  FINDINGS: The enteric tube has been advanced, but the side port remains about 3 cm above the gastroesophageal junction and the tube  should be advanced. Endotracheal tube in unchanged position.  Heart size and vascular pattern normal. Calcification of the aortic arch. Mild left lower lobe atelectasis. Right lung clear.  Stable bilateral clavicular deformities.  IMPRESSION: Enteric feeding tube side port remains above gastroesophageal junction and the tube should be advanced.I discussed this with Joellen Jersey, the patient's nurse at 08:48 on 04/22/14.   Electronically Signed   By: Skipper Cliche M.D.   On: 04/22/2014 08:48   Portable Chest Xray  04/21/2014   CLINICAL DATA:  ETT placement, OG placement  EXAM: PORTABLE CHEST - 1 VIEW  COMPARISON:  04/16/2014  FINDINGS: Endotracheal tube terminates 4.5 cm above the carina.  Enteric tube terminates at the gastric cardia with its side port in the distal esophagus.  Chronic interstitial markings/emphysematous changes. No focal consolidation. No pleural effusion or pneumothorax.  Surgical clips in the left chest wall/axilla.  Fracture involving the lateral/distal right clavicle, age indeterminate. Stable posttraumatic deformity involving the lateral/distal left clavicle.  IMPRESSION: Endotracheal tube terminates 4.5 cm above the carina.  Enteric tube terminates at the gastric cardia with its side port in the distal esophagus.  Fracture involving the lateral/distal right clavicle, age indeterminate.   Electronically Signed   By: Julian Hy M.D.   On: 04/21/2014 21:29       ASSESSMENT / PLAN:  PULMONARY OETT 04/21/2014 Acute respiratory failure due to status epilepticus and  acute encephalopathy P:   Full mechanical ventilator support--> initiate weaning efforts when MS supports and is seizure free VAP protocol RASS goal -2 until seizures resolved.  F/u ABG today F/U PCXR in am 11.28  CARDIOVASCULAR A:   she has coronary artery disease risk factors and the presence of diabetes and age but MI has been ruled out P:  Monitor   RENAL A Mild hyponatremia-->improved  P:   Trend  chemistry   GASTROINTESTINAL Cachexia with protein calorie malnutrition   P:   NPO NG tube tube feeds for nutrition  HEMATOLOGY At risk for anemia of critical illness  P:  PRBC for hgb < 7gm%  INFECTIOUS A:   No evidence of infection P:   BMonitor off antibiotics  ENDORINE A:   DM  P:   ssi  NEUROLOGIC A:   Status epilepticus with acute encepphalopathy and coma P:   Goal seizure free on eEG  Diprivan gtt per sedation PAD protocol 3 Versed prn Anti-epileptics per neuro   FAMILY  - Updates:  No family at bedside 04/21/2014 but per RN daughter aware of deterioration   -  Inter-disciplinary family meet or Palliative Care meeting due by:  04/28/14 which wkll be 7 days in ICU  NP Summary continue supportive care. Await formal read on EEG. Can start weaning efforts when no more seizure. He MS will be her major barrier to extubation.    Marni Griffon ACNP-BC [place] Pager # [phone] OR # [phone] if no answer    TODAY'S MD SUMMARY:   04/22/2014 9:07 AM    PCCM ATTENDING: Pt seen on work rounds with care provider noted above. We reviewed pt's initial presentation, consultants notes and hospital database in detail.  The above assessment and plan was formulated under my direction.  In summary: Recent head trauma and SDH New onset seizures secondary to head trauma Status epilepticus, transferred to ICU 11/27 for intubation, propofol coma, cEEG  She is well supported on current vent settings CXR is clear She is hemodynamically stable Her abdominal exam is normal. Will initiate TFs She has hx of DM. Glycemic control is very good. Risk of hypoglycemia. Therefore change from resistant to moderate SSI scale She exhibits no overt seizure She should be readily extubated once propofol is discontinued assuming that her LOC normalizes Neurology and NS are following She has completed 5 days of abx for uncomplicated UTI - will DC ceftriaxone I  have updated daughter and husband @ bedisde   30 minutes of independent CCM time was provided by me   Merton Border, MD;  PCCM service; Mobile [phone]

## 2014-04-22 NOTE — Progress Notes (Signed)
STROKE TEAM PROGRESS NOTE   HISTORY Diane Stevenson is an 80 y.o. female with a past medical history significant for HTN, DM type II, bilateral breast cancer, DJD, admitted to [facility] due to right hemispheric and right tentorium post traumatic SDH after a fall who has been experiencing fluctuating mental state changes and new onset seizures today. Patient follow by neurosurgery and no aggressive neurosurgical intervention recommended. Nursing staff reports off and on change in mental status, and recurrent but brief episodes of uncontrollable jerkiness involving the left leg greater than the right leg but with preservation of consciousness. Of note, she received some narcotics yesterday. Patient's fluctuation on mental status prompted a repeat CT brain yesterday which showed unchanged SDH in the locations described above. At this moment, patient is awake, follows simple commands, doesn't engage in conversation, and is having bouts of short lasting clonic movements involving the left greater than the right leg..  Serologies showed hyponatremia that is thought to be chronic.  The patient was not treated with TPA secondary to posttraumatic subdural hematoma.  SUBJECTIVE (INTERVAL HISTORY): Patient remains intubated on propofol infusion for status epilepticus. Neurology is following for seizure management.   OBJECTIVE Temp:  [98 F (36.7 C)-99.9 F (37.7 C)] 98.7 F (37.1 C) (11/28 0800) Pulse Rate:  [75-113] 76 (11/28 0813) Cardiac Rhythm:  [-] Normal sinus rhythm (11/28 0800) Resp:  [12-39] 15 (11/28 0813) BP: (98-176)/(53-131) 121/60 mmHg (11/28 0813) SpO2:  [97 %-100 %] 100 % (11/28 0813) FiO2 (%):  [40 %-50 %] 40 % (11/28 0813)   Recent Labs Lab 04/21/14 1526 04/21/14 1928 04/21/14 2321 04/22/14 0329 04/22/14 0759  GLUCAP 172* 175* 181* 137* 105*    Recent Labs Lab 04/17/14 1036  04/19/14 0321 04/19/14 2010 04/20/14 0235 04/21/14 2221 04/22/14 0010  NA 135*  < > 128* 126*  130* 133* 134*  K 4.0  < > 4.1 4.1 4.2 4.0 3.8  CL 97  < > 91* 88* 93* 97 99  CO2 22  < > 22 21 21 20 19   GLUCOSE 155*  < > 196* 210* 212* 187* 192*  BUN 11  < > 13 18 19 18 20   CREATININE 0.65  < > 0.62 0.64 0.62 0.54 0.49*  CALCIUM 8.6  < > 8.8 8.9 8.9 8.5 8.2*  MG 1.1*  --  1.4*  --   --  1.7 1.7  PHOS 3.0  --   --   --   --  2.6 2.4  < > = values in this interval not displayed.  Recent Labs Lab 04/17/14 0155 04/17/14 1036 04/19/14 0321 04/20/14 0235  AST 26 20 15 17   ALT 22 19 17 15   ALKPHOS 54 55 61 60  BILITOT 0.3 0.6 0.6 0.5  PROT 6.6 6.5 6.9 6.9  ALBUMIN 3.5 3.4* 3.4* 3.2*    Recent Labs Lab 04/17/14 0155 04/17/14 1036 04/19/14 0321 04/20/14 0235 04/21/14 2221  WBC 8.7 7.7 11.9* 8.0 7.8  NEUTROABS 6.4  --  10.1*  --   --   HGB 11.8* 11.9* 12.0 11.5* 11.2*  HCT 34.7* 34.8* 35.1* 33.7* 32.8*  MCV 96.7 98.6 95.9 95.5 98.2  PLT 200 176 226 209 210    Recent Labs Lab 04/17/14 0155 04/17/14 0810 04/17/14 1430 04/21/14 2221 04/22/14 0010  CKTOTAL  --   --   --  27 25  CKMB  --   --   --  <1.0 <1.0  TROPONINI <0.30 <0.30 <0.30  --   --  No results for input(s): LABPROT, INR in the last 72 hours. No results for input(s): COLORURINE, LABSPEC, [place], GLUCOSEU, HGBUR, BILIRUBINUR, KETONESUR, PROTEINUR, UROBILINOGEN, NITRITE, LEUKOCYTESUR in the last 72 hours.  Invalid input(s): APPERANCEUR     Component Value Date/Time   CHOL 122 04/19/2014 0321   TRIG 100 04/21/2014 2144   HDL 38* 04/19/2014 0321   CHOLHDL 3.2 04/19/2014 0321   VLDL 16 04/19/2014 0321   LDLCALC 68 04/19/2014 0321   Lab Results  Component Value Date   HGBA1C 6.2* 04/21/2014   No results found for: LABOPIA, COCAINSCRNUR, LABBENZ, AMPHETMU, THCU, LABBARB  No results for input(s): ETH in the last 168 hours.   2-D echocardiogram  04/21/2014 Left ventricle: The cavity size was normal. Wall thickness was increased in a pattern of mild LVH. Systolic function was normal. The  estimated ejection fraction was in the range of 60% to 65%. Wall motion was normal; there were no regional wall motion abnormalities. Doppler parameters are consistent with abnormal left ventricular relaxation (grade 1 diastolic dysfunction). The E/e&' ratio is between 8-15, suggesting indeterminate LV filling pressure. - Left atrium: The atrium was normal in size. - Tricuspid valve: There was mild regurgitation. - Pulmonary arteries: PA peak pressure: 36 mm Hg (S).  Impressions:  - LVEF 60-65%, mild LVH, normal wall motion, diastolic dysfunction, indeterminate LV filling pressure. mild TR, RVSP 36 mmHg.    Ct Head Wo Contrast 04/19/2014    1. Stable since 04/18/2014 parafalcine and right hemispheric subdural hematoma. Stable associated mass effect including partial effacement of the right lateral ventricle.  2. No new intracranial abnormality.      Mr Brain Wo Contrast 04/20/2014    1. Subdural hematoma re - identified in the right hemisphere and stable from recent CTs, including lobulated component along the interhemispheric fissure. Small component in the right posterior fossa.  2. Subtle signal abnormality in the posterior right cingulate gyrus adjacent to the parafalcine hematoma in this setting is suggestive of gyral edema due to recent seizure.  3. Superimposed small acute lacunar infarct in the left thalamus. Possible second tiny lacune in the left occipital pole. No hemorrhage or mass effect associated with these.  4. Underlying chronic small vessel disease.       PHYSICAL EXAM Elderly Caucasian lady who is unresponsive, sedated on propofol. No response to sternal rub. Head is nontraumatic. Neck is supple without bruit. Cardiac exam no murmur or gallop. Intubated.  Distal pulses are well felt. Neurological Exam : unresponsive. Eyes closed not following commands. No response to sternal rub. Pupils reactive. Dolls eye movements sluggish. Conjugate gaze.  Face  symmetric. No withdrawal to pain.  Plantars mute.    ASSESSMENT/PLAN Ms. Diane Stevenson is a 80 y.o. female with history of hypertension, diabetes mellitus, bilateral breast cancer, and DJD presenting with a posttraumatic subdural hematoma. She did did not receive intravenous TPA secondary to hemorrhage.   Stroke:  Non dominant right hemisphere subdural hematoma with multiple lacunar infarcts-left thalamus and right occipital pole. And focal motor seizures.  Resultant  Unresponsive state and focal motor seizures, currently on propofol, intubated  MRI  as above  MRA  not ordered  Carotid Doppler  pending  2D Echo  as above - unremarkable  LDL 68   HgbA1c 6.6  SCDs for VTE prophylaxis  Diet NPO time specified   aspirin 81 mg orally every day prior to admission, now on no antithrombotics  Ongoing aggressive stroke risk factor management  Therapy recommendations: Pending  Disposition:  Pending   Hypertension  Home meds: Norvasc 2.5 mg daily and Altace 10 mg daily   Stable   Diabetes  HgbA1c 6.6  goal < 7.0  Controlled   Other Stroke Risk Factors  Advanced age   Other Active Problems  New-onset seizures - EEG pending - on Keppra and Vimpat - intubated on propofol   Small lacunar strokes by MRI  Mild anemia  Hyponatremia  Other Pertinent History    Hospital day # 6   Personally examined patient and images, agree with history, physical, neuro exam as stated above. Agree with assessment and plan.   Sarina Ill, MD [facility]   Mikey Bussing PA-C [facility] Pager [phone] 04/22/2014, 8:40 AM     To contact Stroke Continuity provider, please refer to [email or web address] After hours, contact General Neurology

## 2014-04-22 NOTE — Plan of Care (Signed)
Problem: Consults Goal: Diabetes Guidelines if Diabetic/Glucose > 140 If diabetic or lab glucose is > 140 mg/dl - Initiate Diabetes/Hyperglycemia Guidelines & Document Interventions  Outcome: Completed/Met Date Met:  04/22/14  Problem: Phase I Progression Outcomes Goal: Oral Care per Protocol Outcome: Completed/Met Date Met:  04/22/14 Goal: HOB elevated 30 degrees Outcome: Completed/Met Date Met:  04/22/14 Goal: VAP prevention protocol initiated Outcome: Completed/Met Date Met:  04/22/14

## 2014-04-22 NOTE — Progress Notes (Signed)
Patient transferred to intensive care unit and intubated secondary to status epilepticus. Seizures now controlled. Patient remained sedated on ventilator.  Follow-up MRI scan with stable appearance of her interhemispheric subdural hematoma. No evidence of significant mass effect area and  I have no new recommendations. The interhemispheric subdural hematoma is not something for which surgical intervention would be appropriate nor is it likely related to her current difficulty with seizure. Continue current medical management.

## 2014-04-22 NOTE — Plan of Care (Signed)
Problem: Consults Goal: PEDS/Adult Seizures Patient Education See Patient Education Module for education specifics. Outcome: Completed/Met Date Met:  04/22/14 Goal: Neurology consult Outcome: Completed/Met Date Met:  04/22/14 Goal: Skin Care Protocol Initiated - if Braden Score 18 or less If consults are not indicated, leave blank or document N/A Outcome: Completed/Met Date Met:  04/22/14 Goal: Nutrition Consult-if indicated Outcome: Completed/Met Date Met:  04/22/14 Goal: Diabetes Guidelines if Diabetic/Glucose > 140 If diabetic or lab glucose is > 140 mg/dl - Initiate Diabetes/Hyperglycemia Guidelines & Document Interventions  Outcome: Completed/Met Date Met:  04/22/14 Goal: Care Management Consult if indicated Outcome: Completed/Met Date Met:  04/22/14 Goal: Play Therapy Outcome: Not Applicable Date Met:  09/32/35  Problem: Phase I Progression Outcomes Goal: Seizure activity controlled Outcome: Completed/Met Date Met:  04/22/14 Goal: IV access obtained Outcome: Completed/Met Date Met:  04/22/14

## 2014-04-23 ENCOUNTER — Inpatient Hospital Stay (HOSPITAL_COMMUNITY): Payer: Medicare Other

## 2014-04-23 DIAGNOSIS — G40301 Generalized idiopathic epilepsy and epileptic syndromes, not intractable, with status epilepticus: Secondary | ICD-10-CM

## 2014-04-23 DIAGNOSIS — E46 Unspecified protein-calorie malnutrition: Secondary | ICD-10-CM

## 2014-04-23 DIAGNOSIS — I6330 Cerebral infarction due to thrombosis of unspecified cerebral artery: Secondary | ICD-10-CM

## 2014-04-23 DIAGNOSIS — R296 Repeated falls: Secondary | ICD-10-CM

## 2014-04-23 DIAGNOSIS — R40243 Glasgow coma scale score 3-8: Secondary | ICD-10-CM

## 2014-04-23 DIAGNOSIS — W19XXXD Unspecified fall, subsequent encounter: Secondary | ICD-10-CM

## 2014-04-23 DIAGNOSIS — J9601 Acute respiratory failure with hypoxia: Secondary | ICD-10-CM

## 2014-04-23 LAB — CBC
HCT: 31.5 % — ABNORMAL LOW (ref 36.0–46.0)
HEMOGLOBIN: 10.4 g/dL — AB (ref 12.0–15.0)
MCH: 32.3 pg (ref 26.0–34.0)
MCHC: 33 g/dL (ref 30.0–36.0)
MCV: 97.8 fL (ref 78.0–100.0)
PLATELETS: 261 10*3/uL (ref 150–400)
RBC: 3.22 MIL/uL — AB (ref 3.87–5.11)
RDW: 14 % (ref 11.5–15.5)
WBC: 8.9 10*3/uL (ref 4.0–10.5)

## 2014-04-23 LAB — BASIC METABOLIC PANEL
ANION GAP: 16 — AB (ref 5–15)
BUN: 27 mg/dL — ABNORMAL HIGH (ref 6–23)
CO2: 17 mEq/L — ABNORMAL LOW (ref 19–32)
Calcium: 8.4 mg/dL (ref 8.4–10.5)
Chloride: 103 mEq/L (ref 96–112)
Creatinine, Ser: 0.58 mg/dL (ref 0.50–1.10)
GFR, EST NON AFRICAN AMERICAN: 84 mL/min — AB (ref >90)
Glucose, Bld: 214 mg/dL — ABNORMAL HIGH (ref 70–99)
POTASSIUM: 4 meq/L (ref 3.7–5.3)
SODIUM: 136 meq/L — AB (ref 137–147)

## 2014-04-23 LAB — GLUCOSE, CAPILLARY
GLUCOSE-CAPILLARY: 207 mg/dL — AB (ref 70–99)
GLUCOSE-CAPILLARY: 254 mg/dL — AB (ref 70–99)
GLUCOSE-CAPILLARY: 237 mg/dL — AB (ref 70–99)
Glucose-Capillary: 224 mg/dL — ABNORMAL HIGH (ref 70–99)
Glucose-Capillary: 212 mg/dL — ABNORMAL HIGH (ref 70–99)
Glucose-Capillary: 237 mg/dL — ABNORMAL HIGH (ref 70–99)

## 2014-04-23 LAB — PROTIME-INR
INR: 1.24 (ref 0.00–1.49)
PROTHROMBIN TIME: 15.7 s — AB (ref 11.6–15.2)

## 2014-04-23 LAB — APTT: APTT: 30 s (ref 24–37)

## 2014-04-23 MED ORDER — SODIUM CHLORIDE 0.9 % IV BOLUS (SEPSIS)
500.0000 mL | Freq: Once | INTRAVENOUS | Status: AC
  Administered 2014-04-23: 500 mL via INTRAVENOUS

## 2014-04-23 NOTE — Plan of Care (Signed)
Problem: Phase I Progression Outcomes Goal: Pain controlled with appropriate interventions Outcome: Completed/Met Date Met:  04/23/14  Problem: Phase II Progression Outcomes Goal: Seizure activity resolved/baseline Outcome: Progressing

## 2014-04-23 NOTE — Progress Notes (Signed)
STROKE TEAM PROGRESS NOTE   HISTORY Diane Stevenson is an 80 y.o. female with a past medical history significant for HTN, DM type II, bilateral breast cancer, DJD, admitted to [facility] due to right hemispheric and right tentorium post traumatic SDH after a fall who has been experiencing fluctuating mental state changes and new onset seizures today. Patient follow by neurosurgery and no aggressive neurosurgical intervention recommended. Nursing staff reports off and on change in mental status, and recurrent but brief episodes of uncontrollable jerkiness involving the left leg greater than the right leg but with preservation of consciousness. Of note, she received some narcotics yesterday. Patient's fluctuation on mental status prompted a repeat CT brain yesterday which showed unchanged SDH in the locations described above. At this moment, patient is awake, follows simple commands, doesn't engage in conversation, and is having bouts of short lasting clonic movements involving the left greater than the right leg..  Serologies showed hyponatremia that is thought to be chronic.  The patient was not treated with TPA secondary to posttraumatic subdural hematoma.  SUBJECTIVE (INTERVAL HISTORY): Patient has been weaned off of the Propofol. No abnormal movements noted. On continuous EEG. She is more alert, opening eyes.    OBJECTIVE Temp:  [98.4 F (36.9 C)-99.4 F (37.4 C)] 98.9 F (37.2 C) (11/29 0755) Pulse Rate:  [74-101] 93 (11/29 0755) Cardiac Rhythm:  [-] Normal sinus rhythm (11/29 0000) Resp:  [15-26] 18 (11/29 0755) BP: (122-182)/(58-87) 149/67 mmHg (11/29 0755) SpO2:  [99 %-100 %] 99 % (11/29 0755) FiO2 (%):  [30 %-40 %] 30 % (11/29 0755) Weight:  [147 lb 0.8 oz (66.7 kg)-147 lb 11.3 oz (67 kg)] 147 lb 0.8 oz (66.7 kg) (11/29 0500)   Recent Labs Lab 04/22/14 1544 04/22/14 2010 04/22/14 2326 04/23/14 0337 04/23/14 0750  GLUCAP 149* 236* 207* 224* 212*    Recent Labs Lab  04/17/14 1036  04/19/14 0321 04/19/14 2010 04/20/14 0235 04/21/14 2221 04/22/14 0010 04/23/14 0211  NA 135*  < > 128* 126* 130* 133* 134* 136*  K 4.0  < > 4.1 4.1 4.2 4.0 3.8 4.0  CL 97  < > 91* 88* 93* 97 99 103  CO2 22  < > 22 21 21 20 19  17*  GLUCOSE 155*  < > 196* 210* 212* 187* 192* 214*  BUN 11  < > 13 18 19 18 20  27*  CREATININE 0.65  < > 0.62 0.64 0.62 0.54 0.49* 0.58  CALCIUM 8.6  < > 8.8 8.9 8.9 8.5 8.2* 8.4  MG 1.1*  --  1.4*  --   --  1.7 1.7  --   PHOS 3.0  --   --   --   --  2.6 2.4  --   < > = values in this interval not displayed.  Recent Labs Lab 04/17/14 0155 04/17/14 1036 04/19/14 0321 04/20/14 0235  AST 26 20 15 17   ALT 22 19 17 15   ALKPHOS 54 55 61 60  BILITOT 0.3 0.6 0.6 0.5  PROT 6.6 6.5 6.9 6.9  ALBUMIN 3.5 3.4* 3.4* 3.2*    Recent Labs Lab 04/17/14 0155 04/17/14 1036 04/19/14 0321 04/20/14 0235 04/21/14 2221 04/23/14 0211  WBC 8.7 7.7 11.9* 8.0 7.8 8.9  NEUTROABS 6.4  --  10.1*  --   --   --   HGB 11.8* 11.9* 12.0 11.5* 11.2* 10.4*  HCT 34.7* 34.8* 35.1* 33.7* 32.8* 31.5*  MCV 96.7 98.6 95.9 95.5 98.2 97.8  PLT 200 176 226  [place] Lab 04/17/14 0155 04/17/14 0810 04/17/14 1430 04/21/14 2221 04/22/14 0010  CKTOTAL  --   --   --  27 25  CKMB  --   --   --  <1.0 <1.0  TROPONINI <0.30 <0.30 <0.30  --   --     Recent Labs  04/23/14 0211  LABPROT 15.7*  INR 1.24   No results for input(s): COLORURINE, LABSPEC, PHURINE, GLUCOSEU, HGBUR, BILIRUBINUR, KETONESUR, PROTEINUR, UROBILINOGEN, NITRITE, LEUKOCYTESUR in the last 72 hours.  Invalid input(s): APPERANCEUR     Component Value Date/Time   CHOL 122 04/19/2014 0321   TRIG 100 04/21/2014 2144   HDL 38* 04/19/2014 0321   CHOLHDL 3.2 04/19/2014 0321   VLDL 16 04/19/2014 0321   LDLCALC 68 04/19/2014 0321   Lab Results  Component Value Date   HGBA1C 6.2* 04/21/2014   No results found for: LABOPIA, COCAINSCRNUR, LABBENZ, AMPHETMU, THCU, LABBARB  No  results for input(s): ETH in the last 168 hours.    EEG 04/22/2014 This is an abnormal 24 hr video-EEG recording.  Significant findings include: 1. Recurrent focal seizures, primarily nonconvulsive, arising from from the right frontal lobe; none after midnight (04/22/2014) 2. Right frontal periodic epileptiform discharges (PLEDS) 3. Diffuse slowing on the left, low voltage polymorphic theta/delta 4. Suppression, right hemisphere   2-D echocardiogram  04/21/2014 Left ventricle: The cavity size was normal. Wall thickness was increased in a pattern of mild LVH. Systolic function was normal. The estimated ejection fraction was in the range of 60% to 65%. Wall motion was normal; there were no regional wall motion abnormalities. Doppler parameters are consistent with abnormal left ventricular relaxation (grade 1 diastolic dysfunction). The E/e&' ratio is between 8-15, suggesting indeterminate LV filling pressure. - Left atrium: The atrium was normal in size. - Tricuspid valve: There was mild regurgitation. - Pulmonary arteries: PA peak pressure: 36 mm Hg (S).  Impressions:  - LVEF 60-65%, mild LVH, normal wall motion, diastolic dysfunction, indeterminate LV filling pressure. mild TR, RVSP 36 mmHg.    Ct Head Wo Contrast 04/19/2014    1. Stable since 04/18/2014 parafalcine and right hemispheric subdural hematoma. Stable associated mass effect including partial effacement of the right lateral ventricle.  2. No new intracranial abnormality.      Mr Brain Wo Contrast 04/20/2014    1. Subdural hematoma re - identified in the right hemisphere and stable from recent CTs, including lobulated component along the interhemispheric fissure. Small component in the right posterior fossa.  2. Subtle signal abnormality in the posterior right cingulate gyrus adjacent to the parafalcine hematoma in this setting is suggestive of gyral edema due to recent seizure.  3. Superimposed  small acute lacunar infarct in the left thalamus. Possible second tiny lacune in the left occipital pole. No hemorrhage or mass effect associated with these.  4. Underlying chronic small vessel disease.       PHYSICAL EXAM Elderly Caucasian lady who is unresponsive, intubated. Opens eyes to sternal rub. Not following commands. Head is nontraumatic. Neck is supple without bruit. Cardiac exam no murmur or gallop. Intubated.  Distal pulses are well felt. Neurological Exam : Eyes slightly open. Not following commands. Pupils equal, sluggish. Conjugate gaze. Withdraws to painful stimuli bilateral lowers not uppers. Plantars mute.    ASSESSMENT/PLAN Ms. ZIANNA DERCOLE is a 80 y.o. female with history of hypertension, diabetes mellitus, bilateral breast cancer, and DJD presenting with a posttraumatic subdural hematoma. She did did not  receive intravenous TPA secondary to hemorrhage.   Stroke:  Non dominant right hemisphere subdural hematoma with multiple lacunar infarcts-left thalamus and right occipital pole. And focal motor seizures.  Resultant - Weaned from propofol, continuous EEG, more alert opening eyes slightly but not following commands  MRI  as above  MRA  not ordered  Carotid Doppler  pending  2D Echo  as above - unremarkable  LDL 68   HgbA1c 6.2  SCDs for VTE prophylaxis  Diet NPO time specified   aspirin 81 mg orally every day prior to admission, now on no antithrombotics  Ongoing aggressive stroke risk factor management  Therapy recommendations: Pending   Disposition:  Pending   Hypertension  Home meds: Norvasc 2.5 mg daily and Altace 10 mg daily   Stable   Diabetes  HgbA1c 6.2  goal < 7.0  Controlled   Other Stroke Risk Factors  Advanced age   Other Active Problems  New-onset seizures - EEG - abnormal as above - on Keppra and Vimpat - weaned off of propofol, continuous eeg.  Small lacunar strokes by MRI  Mild anemia  Hyponatremia  Other  Pertinent History    Hospital day # [facility] Pager [phone] 04/23/2014, 9:04 AM   Personally examined patient and images, agree with history, physical, neuro exam as stated above. Agree with assessment and plan.   Sarina Ill, MD [facility]         To contact Stroke Continuity provider, please refer to [email or web address] After hours, contact General Neurology

## 2014-04-23 NOTE — Progress Notes (Signed)
Patient remains very obtunded. Will open eyes to noxious stimuli. Appears minimally aware. Not following commands.  Certainly her interhemispheric subdural hematoma does not explain her current level of consciousness. Patient has been more than 24 hours without witnessed seizures. She is off all sedation. Hopefully this represents a prolonged postictal state. I have nothing new to offer at this point. Continue supportive management.

## 2014-04-23 NOTE — Plan of Care (Signed)
Problem: Phase I Progression Outcomes Goal: Initiate hyperglycemia protocol as indicated Outcome: Completed/Met Date Met:  04/23/14 Goal: Pain controlled with appropriate interventions Outcome: Completed/Met Date Met:  04/23/14 Goal: Hemodynamically stable Outcome: Completed/Met Date Met:  04/23/14

## 2014-04-23 NOTE — Plan of Care (Signed)
Problem: Consults Goal: Psychologist Consult if indicated Outcome: Not Applicable Date Met:  36/85/99

## 2014-04-23 NOTE — Progress Notes (Signed)
LTVM EEG still running. Dr Armida Sans would like to keep EEG running at this time, he will advise when to D/C EEG.

## 2014-04-23 NOTE — Progress Notes (Signed)
PULMONARY / CRITICAL CARE MEDICINE   Name: Diane Stevenson MRN: [id number] DOB: 1929/06/16   LOS 7 days PCP Irven Shelling, MD   ADMISSION DATE:  04/16/2014 CONSULTATION DATE:  04/21/14  REFERRING MD :  Dr Dorian Pod of neuro and Dr Lakeysha Puffer [facility]   INITIAL PRESENTATION:  80 year old female who is cachectic and multiple medical problems including breast cancer 1997 and type 2 diabetes sustained subdural hematoma in the right frontal lobe FALCINE SDH after a fall on 04/16/2014. In addition she also sustained 3 fractures of T1, T6 and T10 above her kyphoplasty's at T11-T12 along with nondisplaced right 11th rib fracture. All this  was deemed nonoperative by neurosurgery and she was admitted to stepdown ICU by the tried hospitalist service. She had acute mental status change and CT head showed expansion of the subdural hematoma. Then on 04/20/2014 she developed in association wit chronic hyponatremia new onset seizures of clonic movements involving the left greater than the right leg and also the left upper extremity. EEG on 04/21/2014 showed status epilepticus despite medical management and patient was moved to the intensive care unit under the critical care medicine service for respiratory compromise, and her tracheal intubation and acute respiratory failure and controlling seizures with DIPRIVAN. PCCM will be primary from 04/21/14   SIGNIFICANT EVENTS: 04/16/2014 - admit 04/21/14 - ICU transfer, PCCM primary and Intubation for diprivan gtt and continuous EEG   SUBJECTIVE:  Off diprivan on vent, comatose  VITAL SIGNS: Temp:  [98.4 F (36.9 C)-99.4 F (37.4 C)] 98.9 F (37.2 C) (11/29 0755) Pulse Rate:  [74-101] 93 (11/29 0755) Resp:  [15-26] 18 (11/29 0755) BP: (122-182)/(58-87) 149/67 mmHg (11/29 0755) SpO2:  [99 %-100 %] 99 % (11/29 0755) FiO2 (%):  [30 %-40 %] 30 % (11/29 0755) Weight:  [66.7 kg (147 lb 0.8 oz)-67 kg (147 lb 11.3 oz)] 66.7 kg (147 lb 0.8  oz) (11/29 0500) HEMODYNAMICS:   VENTILATOR SETTINGS: Vent Mode:  [-] CPAP FiO2 (%):  [30 %-40 %] 30 % Set Rate:  [15 bmp] 15 bmp Vt Set:  [450 mL] 450 mL PEEP:  [5 cmH20] 5 cmH20 Pressure Support:  [12 cmH20] 12 cmH20 Plateau Pressure:  [12 cmH20-13 cmH20] 12 cmH20 INTAKE / OUTPUT:  Intake/Output Summary (Last 24 hours) at 04/23/14 0827 Last data filed at 04/23/14 0700  Gross per 24 hour  Intake 1801.43 ml  Output    680 ml  Net 1121.43 ml    PHYSICAL EXAMINATION: General: Frail cachectic female comatose on vent Neuro: She is unarousable with RASS sedation score equivalent-4 but still has gag off all sedation HEENT: Neck is supple no neck nodes pupils equal and reactive to light Cardiovascular: Normal heart sounds normotensive Lungs: Clear to auscultation bilaterally Abdomen: Soft, no mass normal bowel sounds Musculoskeletal: No cyanosis no clubbing no pedal edema Skin:  Appears intact anteriorly LABS: PULMONARY  Recent Labs Lab 04/21/14 2155 04/22/14 0958  PHART 7.464* 7.441  PCO2ART 33.2* 30.1*  PO2ART 202.0* 128.0*  HCO3 23.8 20.2  TCO2 25 21.1  O2SAT 100.0 99.1    CBC  Recent Labs Lab 04/20/14 0235 04/21/14 2221 04/23/14 0211  HGB 11.5* 11.2* 10.4*  HCT 33.7* 32.8* 31.5*  WBC 8.0 7.8 8.9  PLT 209 210 261    COAGULATION  Recent Labs Lab 04/17/14 0155 04/23/14 0211  INR 1.16 1.24    CARDIAC    Recent Labs Lab 04/17/14 0155 04/17/14 0810 04/17/14 1430  TROPONINI <0.30 <0.30 <0.30  Recent Labs Lab 04/22/14 0010  PROBNP 727.4*     CHEMISTRY  Recent Labs Lab 04/17/14 1036  04/19/14 0321 04/19/14 2010 04/20/14 0235 04/21/14 2221 04/22/14 0010 04/23/14 0211  NA 135*  < > 128* 126* 130* 133* 134* 136*  K 4.0  < > 4.1 4.1 4.2 4.0 3.8 4.0  CL 97  < > 91* 88* 93* 97 99 103  CO2 22  < > 22 21 21 20 19  17*  GLUCOSE 155*  < > 196* 210* 212* 187* 192* 214*  BUN 11  < > 13 18 19 18 20  27*  CREATININE 0.65  < > 0.62 0.64 0.62  0.54 0.49* 0.58  CALCIUM 8.6  < > 8.8 8.9 8.9 8.5 8.2* 8.4  MG 1.1*  --  1.4*  --   --  1.7 1.7  --   PHOS 3.0  --   --   --   --  2.6 2.4  --   < > = values in this interval not displayed. Estimated Creatinine Clearance: 48.8 mL/min (by C-G formula based on Cr of 0.58).   LIVER  Recent Labs Lab 04/17/14 0155 04/17/14 1036 04/19/14 0321 04/20/14 0235 04/23/14 0211  AST 26 20 15 17   --   ALT 22 19 17 15   --   ALKPHOS 54 55 61 60  --   BILITOT 0.3 0.6 0.6 0.5  --   PROT 6.6 6.5 6.9 6.9  --   ALBUMIN 3.5 3.4* 3.4* 3.2*  --   INR 1.16  --   --   --  1.24     INFECTIOUS  Recent Labs Lab 04/21/14 2144 04/22/14 0010  LATICACIDVEN 1.4 1.2     ENDOCRINE CBG (last 3)   Recent Labs  04/22/14 2326 04/23/14 0337 04/23/14 0750  GLUCAP 207* 224* 212*    IMAGING x48h Dg Chest Port 1 View  04/22/2014   CLINICAL DATA:  Subsequent evaluation of acute respiratory failure  EXAM: PORTABLE CHEST - 1 VIEW  COMPARISON:  04/21/2014  FINDINGS: The enteric tube has been advanced, but the side port remains about 3 cm above the gastroesophageal junction and the tube should be advanced. Endotracheal tube in unchanged position.  Heart size and vascular pattern normal. Calcification of the aortic arch. Mild left lower lobe atelectasis. Right lung clear.  Stable bilateral clavicular deformities.  IMPRESSION: Enteric feeding tube side port remains above gastroesophageal junction and the tube should be advanced.I discussed this with Joellen Jersey, the patient's nurse at 08:48 on 04/22/14.   Electronically Signed   By: Skipper Cliche M.D.   On: 04/22/2014 08:48   Portable Chest Xray  04/21/2014   CLINICAL DATA:  ETT placement, OG placement  EXAM: PORTABLE CHEST - 1 VIEW  COMPARISON:  04/16/2014  FINDINGS: Endotracheal tube terminates 4.5 cm above the carina.  Enteric tube terminates at the gastric cardia with its side port in the distal esophagus.  Chronic interstitial markings/emphysematous changes. No  focal consolidation. No pleural effusion or pneumothorax.  Surgical clips in the left chest wall/axilla.  Fracture involving the lateral/distal right clavicle, age indeterminate. Stable posttraumatic deformity involving the lateral/distal left clavicle.  IMPRESSION: Endotracheal tube terminates 4.5 cm above the carina.  Enteric tube terminates at the gastric cardia with its side port in the distal esophagus.  Fracture involving the lateral/distal right clavicle, age indeterminate.   Electronically Signed   By: Julian Hy M.D.   On: 04/21/2014 21:29   Dg Abd Portable 1v  04/22/2014  CLINICAL DATA:  Orogastric tube placement  EXAM: PORTABLE ABDOMEN - 1 VIEW  COMPARISON:  04/16/2014  FINDINGS: Orogastric tube has been placed and is located within the stomach.  IMPRESSION: Tube within the stomach.   Electronically Signed   By: Skipper Cliche M.D.   On: 04/22/2014 12:25       ASSESSMENT / PLAN:  PULMONARY OETT 04/21/2014 Acute respiratory failure due to status epilepticus and  acute encephalopathy P:   Full mechanical ventilator support--> initiate weaning efforts when MS supports and is seizure free VAP protocol RASS goal 0    CARDIOVASCULAR A:   she has coronary artery disease risk factors and the presence of diabetes and age but MI has been ruled out P:  Monitor   RENAL A Oliguria Mild hyponatremia-->improved  P:   Trend chemistry  Fluid bolus   GASTROINTESTINAL Cachexia with protein calorie malnutrition   P:   NPO NG tube tube feeds for nutrition  HEMATOLOGY At risk for anemia of critical illness  P:  PRBC for hgb < 7gm%  INFECTIOUS A:   No evidence of infection P:   Monitor off antibiotics  ENDORINE A:   DM  P:   ssi  NEUROLOGIC A:   Status epilepticus with  Coma>>no seizures since 12MIDnight on eeg SDH P:   Goal seizure free on eEG  Diprivan gtt per sedation PAD protocol 3 Versed prn Anti-epileptics per neuro   FAMILY  - Updates:  I  updated daughter by phone AM 04/23/14.  She is aware of poor neuro prognosis and was receptive to discussing further .    - Inter-disciplinary family meet or Palliative Care meeting due by:  04/28/14 which wkll be 7 days in ICU  CC time 85min  TODAY'S SUMMARY:  80 y.o.F SDH and AMS /seizures.  Not waking up , on vent. Cont support and f/u neuro status off sedation.    Mariel Sleet Beeper  [phone]  Cell  [phone]  If no response or cell goes to voicemail, call beeper [phone]  04/23/2014 8:27 AM

## 2014-04-24 ENCOUNTER — Inpatient Hospital Stay (HOSPITAL_COMMUNITY): Payer: Medicare Other

## 2014-04-24 DIAGNOSIS — I6200 Nontraumatic subdural hemorrhage, unspecified: Secondary | ICD-10-CM

## 2014-04-24 DIAGNOSIS — G9340 Encephalopathy, unspecified: Secondary | ICD-10-CM | POA: Insufficient documentation

## 2014-04-24 DIAGNOSIS — M549 Dorsalgia, unspecified: Secondary | ICD-10-CM | POA: Insufficient documentation

## 2014-04-24 LAB — CBC WITH DIFFERENTIAL/PLATELET
Basophils Absolute: 0 10*3/uL (ref 0.0–0.1)
Basophils Relative: 0 % (ref 0–1)
EOS ABS: 0.1 10*3/uL (ref 0.0–0.7)
Eosinophils Relative: 2 % (ref 0–5)
HCT: 29.3 % — ABNORMAL LOW (ref 36.0–46.0)
Hemoglobin: 9.6 g/dL — ABNORMAL LOW (ref 12.0–15.0)
Lymphocytes Relative: 14 % (ref 12–46)
Lymphs Abs: 1 10*3/uL (ref 0.7–4.0)
MCH: 32.4 pg (ref 26.0–34.0)
MCHC: 32.8 g/dL (ref 30.0–36.0)
MCV: 99 fL (ref 78.0–100.0)
Monocytes Absolute: 0.7 10*3/uL (ref 0.1–1.0)
Monocytes Relative: 10 % (ref 3–12)
NEUTROS PCT: 74 % (ref 43–77)
Neutro Abs: 5 10*3/uL (ref 1.7–7.7)
PLATELETS: 241 10*3/uL (ref 150–400)
RBC: 2.96 MIL/uL — ABNORMAL LOW (ref 3.87–5.11)
RDW: 14 % (ref 11.5–15.5)
WBC: 6.7 10*3/uL (ref 4.0–10.5)

## 2014-04-24 LAB — GLUCOSE, CAPILLARY
GLUCOSE-CAPILLARY: 204 mg/dL — AB (ref 70–99)
GLUCOSE-CAPILLARY: 238 mg/dL — AB (ref 70–99)
Glucose-Capillary: 221 mg/dL — ABNORMAL HIGH (ref 70–99)
Glucose-Capillary: 224 mg/dL — ABNORMAL HIGH (ref 70–99)
Glucose-Capillary: 274 mg/dL — ABNORMAL HIGH (ref 70–99)

## 2014-04-24 LAB — BASIC METABOLIC PANEL
Anion gap: 14 (ref 5–15)
BUN: 26 mg/dL — ABNORMAL HIGH (ref 6–23)
CALCIUM: 8.2 mg/dL — AB (ref 8.4–10.5)
CO2: 20 mEq/L (ref 19–32)
Chloride: 107 mEq/L (ref 96–112)
Creatinine, Ser: 0.5 mg/dL (ref 0.50–1.10)
GFR calc non Af Amer: 88 mL/min — ABNORMAL LOW (ref >90)
Glucose, Bld: 226 mg/dL — ABNORMAL HIGH (ref 70–99)
Potassium: 4.1 mEq/L (ref 3.7–5.3)
Sodium: 141 mEq/L (ref 137–147)

## 2014-04-24 MED ORDER — ACETAMINOPHEN 650 MG RE SUPP
650.0000 mg | Freq: Four times a day (QID) | RECTAL | Status: DC | PRN
  Administered 2014-04-28: 650 mg via RECTAL
  Filled 2014-04-24 (×3): qty 1

## 2014-04-24 MED ORDER — INSULIN GLARGINE 100 UNIT/ML ~~LOC~~ SOLN
10.0000 [IU] | Freq: Every day | SUBCUTANEOUS | Status: DC
  Administered 2014-04-24 – 2014-04-25 (×2): 10 [IU] via SUBCUTANEOUS
  Filled 2014-04-24 (×2): qty 0.1

## 2014-04-24 MED ORDER — LABETALOL HCL 5 MG/ML IV SOLN
10.0000 mg | INTRAVENOUS | Status: DC
  Administered 2014-04-25 – 2014-04-30 (×6): 10 mg via INTRAVENOUS
  Filled 2014-04-24 (×42): qty 4

## 2014-04-24 MED ORDER — ACETAMINOPHEN 160 MG/5ML PO SOLN
650.0000 mg | Freq: Four times a day (QID) | ORAL | Status: DC | PRN
  Administered 2014-04-24 – 2014-05-01 (×10): 650 mg
  Filled 2014-04-24 (×11): qty 20.3

## 2014-04-24 NOTE — Progress Notes (Signed)
LTVM D/C'd per Burnetta Sabin, NP

## 2014-04-24 NOTE — Progress Notes (Signed)
Patient ID: Diane Stevenson, female   DOB: 09-11-1929, 80 y.o.   MRN: [id number] Events of this past weekend noted. Patient developed seizures this past Friday which ultimately required intubation. MRI performed on November 26 demonstrates some edema in the gray matter near the singular gyrus which may be related to the seizure itself. The interhemispheric subdural hematoma remained stable at 12 mm in maximum thickness. There may be a small lacunar infarct in the left thalamus on diffusion weighted imaging.  Clinically at the current time the patient remains intubated. She has not received sedation in last 24 hours but appears somnolent. No spontaneous eye opening. No further obvious seizure activity.  Continue supportive care however given her clinical scenario her prognosis is rather guarded at this time. I discussed her situation with her husband who was present in the room this morning.

## 2014-04-24 NOTE — Progress Notes (Signed)
STROKE TEAM PROGRESS NOTE   SUBJECTIVE: Patient has been weaned off of the Propofol and no benzo in >48hrs. No abnormal movements noted. Continuous EEG discontinued. Per RN she will intermittently open her eyes and move the LUE.   HISTORY Diane Stevenson is an 80 y.o. female with a past medical history significant for HTN, DM type II, bilateral breast cancer, DJD, admitted to [facility] due to right hemispheric and right tentorium post traumatic SDH after a fall who has been experiencing fluctuating mental state changes and new onset seizures today. Patient follow by neurosurgery and no aggressive neurosurgical intervention recommended. Nursing staff reports off and on change in mental status, and recurrent but brief episodes of uncontrollable jerkiness involving the left leg greater than the right leg but with preservation of consciousness. Of note, she received some narcotics yesterday. Patient's fluctuation on mental status prompted a repeat CT brain yesterday which showed unchanged SDH in the locations described above. At this moment, patient is awake, follows simple commands, doesn't engage in conversation, and is having bouts of short lasting clonic movements involving the left greater than the right leg..  Serologies showed hyponatremia that is thought to be chronic.  The patient was not treated with TPA secondary to posttraumatic subdural hematoma.   OBJECTIVE Temp:  [98.4 F (36.9 C)-100.7 F (38.2 C)] 99.8 F (37.7 C) (11/30 0739) Pulse Rate:  [82-101] 86 (11/30 0900) Cardiac Rhythm:  [-] Normal sinus rhythm (11/29 2000) Resp:  [12-18] 16 (11/30 0900) BP: (128-181)/(57-76) 167/68 mmHg (11/30 0900) SpO2:  [96 %-100 %] 99 % (11/30 0900) FiO2 (%):  [30 %-40 %] 40 % (11/30 0800) Weight:  [67.3 kg (148 lb 5.9 oz)] 67.3 kg (148 lb 5.9 oz) (11/30 0400)   Recent Labs Lab 04/23/14 1529 04/23/14 1935 04/23/14 2330 04/24/14 0337 04/24/14 0738  GLUCAP 237* 237* 224* 204* 221*     Recent Labs Lab 04/17/14 1036  04/19/14 0321  04/20/14 0235 04/21/14 2221 04/22/14 0010 04/23/14 0211 04/24/14 0225  NA 135*  < > 128*  < > 130* 133* 134* 136* 141  K 4.0  < > 4.1  < > 4.2 4.0 3.8 4.0 4.1  CL 97  < > 91*  < > 93* 97 99 103 107  CO2 22  < > 22  < > 21 20 19  17* 20  GLUCOSE 155*  < > 196*  < > 212* 187* 192* 214* 226*  BUN 11  < > 13  < > 19 18 20  27* 26*  CREATININE 0.65  < > 0.62  < > 0.62 0.54 0.49* 0.58 0.50  CALCIUM 8.6  < > 8.8  < > 8.9 8.5 8.2* 8.4 8.2*  MG 1.1*  --  1.4*  --   --  1.7 1.7  --   --   PHOS 3.0  --   --   --   --  2.6 2.4  --   --   < > = values in this interval not displayed.  Recent Labs Lab 04/17/14 1036 04/19/14 0321 04/20/14 0235  AST 20 15 17   ALT 19 17 15   ALKPHOS 55 61 60  BILITOT 0.6 0.6 0.5  PROT 6.5 6.9 6.9  ALBUMIN 3.4* 3.4* 3.2*    Recent Labs Lab 04/19/14 0321 04/20/14 0235 04/21/14 2221 04/23/14 0211 04/24/14 0225  WBC 11.9* 8.0 7.8 8.9 6.7  NEUTROABS 10.1*  --   --   --  5.0  HGB 12.0 11.5* 11.2* 10.4* 9.6*  HCT 35.1* 33.7* 32.8* 31.5* 29.3*  MCV 95.9 95.5 98.2 97.8 99.0  PLT 226 209 210 261 241    Recent Labs Lab 04/17/14 1430 04/21/14 2221 04/22/14 0010  CKTOTAL  --  27 25  CKMB  --  <1.0 <1.0  TROPONINI <0.30  --   --     Recent Labs  04/23/14 0211  LABPROT 15.7*  INR 1.24   No results for input(s): COLORURINE, LABSPEC, PHURINE, GLUCOSEU, HGBUR, BILIRUBINUR, KETONESUR, PROTEINUR, UROBILINOGEN, NITRITE, LEUKOCYTESUR in the last 72 hours.  Invalid input(s): APPERANCEUR     Component Value Date/Time   CHOL 122 04/19/2014 0321   TRIG 100 04/21/2014 2144   HDL 38* 04/19/2014 0321   CHOLHDL 3.2 04/19/2014 0321   VLDL 16 04/19/2014 0321   LDLCALC 68 04/19/2014 0321   Lab Results  Component Value Date   HGBA1C 6.2* 04/21/2014   No results found for: LABOPIA, COCAINSCRNUR, LABBENZ, AMPHETMU, THCU, LABBARB  No results for input(s): ETH in the last 168 hours.     EEG 04/22/2014 This is an abnormal 24 hr video-EEG recording.  Significant findings include: 1. Recurrent focal seizures, primarily nonconvulsive, arising from from the right frontal lobe; none after midnight (04/22/2014) 2. Right frontal periodic epileptiform discharges (PLEDS) 3. Diffuse slowing on the left, low voltage polymorphic theta/delta 4. Suppression, right hemisphere   2-D echocardiogram  04/21/2014 Left ventricle: The cavity size was normal. Wall thickness was increased in a pattern of mild LVH. Systolic function was normal. The estimated ejection fraction was in the range of 60% to 65%. Wall motion was normal; there were no regional wall motion abnormalities. Doppler parameters are consistent with abnormal left ventricular relaxation (grade 1 diastolic dysfunction). The E/e&' ratio is between 8-15, suggesting indeterminate LV filling pressure. - Left atrium: The atrium was normal in size. - Tricuspid valve: There was mild regurgitation. - Pulmonary arteries: PA peak pressure: 36 mm Hg (S).  Impressions:  - LVEF 60-65%, mild LVH, normal wall motion, diastolic dysfunction, indeterminate LV filling pressure. mild TR, RVSP 36 mmHg.    Ct Head Wo Contrast 04/19/2014    1. Stable since 04/18/2014 parafalcine and right hemispheric subdural hematoma. Stable associated mass effect including partial effacement of the right lateral ventricle.  2. No new intracranial abnormality.      Mr Brain Wo Contrast 04/20/2014    1. Subdural hematoma re - identified in the right hemisphere and stable from recent CTs, including lobulated component along the interhemispheric fissure. Small component in the right posterior fossa.  2. Subtle signal abnormality in the posterior right cingulate gyrus adjacent to the parafalcine hematoma in this setting is suggestive of gyral edema due to recent seizure.  3. Superimposed small acute lacunar infarct in the left thalamus.  Possible second tiny lacune in the left occipital pole. No hemorrhage or mass effect associated with these.  4. Underlying chronic small vessel disease.       PHYSICAL EXAM Elderly Caucasian lady who is unresponsive, intubated. No eye opening. Not following commands. Head is nontraumatic. Neck is supple without bruit. Cardiac exam no murmur or gallop. Intubated.  Distal pulses are well felt. Neurological Exam : Not following commands. Pupils equal, sluggish. Conjugate gaze. Withdraws to painful stimuli bilateral lowers not uppers. Plantars mute.    ASSESSMENT/PLAN Diane Stevenson is a 80 y.o. female with history of hypertension, diabetes mellitus, bilateral breast cancer, and DJD presenting with a posttraumatic subdural hematoma. She did did not receive intravenous TPA secondary to hemorrhage.  Current active problem is encephalopathy of unclear etiology. Patient has known SDH, new infarcts on MRI and new onset seizure since admission. She has been off sedation for >48hrs but continues to be obtunded. Continuous EEG discontinued today. Read is pending.  -await continuous EEG read -hold sedation and ativan -continue vimpat and keppra. May taper down vimpat if EEG unremarkable and mental status remains depressed -if EEG unremarkable will plan to repeat head CT -will continue to follow    This patient is critically ill and at significant risk of neurological worsening, death and care requires constant monitoring of vital signs, hemodynamics,respiratory and cardiac monitoring,review of multiple databases, neurological assessment, discussion with family, other specialists and medical decision making of high complexity. I spent 35 inutes of neurocritical care time in the care of this patient.   Jim Like, DO [facility] [phone]  If 7pm- 7am, please page neurology on call as listed in [place].    To contact Stroke Continuity provider, please refer to  [email or web address] After hours, contact General Neurology

## 2014-04-24 NOTE — Progress Notes (Signed)
PULMONARY / CRITICAL CARE MEDICINE   Name: Diane Stevenson MRN: [id number] DOB: Jul 29, 1929   LOS 8 days PCP Irven Shelling, MD   ADMISSION DATE:  04/16/2014 CONSULTATION DATE:  04/21/14  REFERRING MD :  Dr Dorian Pod of neuro and Dr Satya Puffer Of [facility]  CC:  Status epilepticus with risk for respiratory compromise  INITIAL PRESENTATION:  80 year old female who is cachectic and multiple medical problems including breast cancer 1997 and type 2 diabetes sustained subdural hematoma in the right frontal lobe FALCINE SDH after a fall on 04/16/2014. In addition she also sustained 3 fractures of T1, T6 and T10 above her kyphoplasty's at T11-T12 along with nondisplaced right 11th rib fracture. All this  was deemed nonoperative by neurosurgery and she was admitted to stepdown ICU by the tried hospitalist service. She had acute mental status change and CT head showed expansion of the subdural hematoma. Then on 04/20/2014 she developed in association wit chronic hyponatremia new onset seizures of clonic movements involving the left greater than the right leg and also the left upper extremity. EEG on 04/21/2014 showed status epilepticus despite medical management and patient was moved to the intensive care unit under the critical care medicine service for respiratory compromise, and her tracheal intubation and acute respiratory failure and controlling seizures with DIPRIVAN. PCCM will be primary from 04/21/14  SIGNIFICANT EVENTS: 04/16/2014 - admit for fall with acute subdural hematoma - non-surgical 04/21/14 - ICU transfer, PCCM primary and Intubation for diprivan gtt and continuous EEG 04/22/14 - off diprivan gtt  SUBJECTIVE:  PS 12 weaning  VITAL SIGNS: Temp:  [98.4 F (36.9 C)-100.7 F (38.2 C)] 99.8 F (37.7 C) (11/30 0739) Pulse Rate:  [82-101] 96 (11/30 0748) Resp:  [10-18] 16 (11/30 0748) BP: (128-181)/(57-74) 167/74 mmHg (11/30 0748) SpO2:  [96 %-100 %] 96 % (11/30  0752) FiO2 (%):  [30 %-40 %] 40 % (11/30 0752) Weight:  [148 lb 5.9 oz (67.3 kg)] 148 lb 5.9 oz (67.3 kg) (11/30 0400) HEMODYNAMICS:   VENTILATOR SETTINGS: Vent Mode:  [-] CPAP FiO2 (%):  [30 %-40 %] 40 % Set Rate:  [15 bmp] 15 bmp Vt Set:  [450 mL] 450 mL PEEP:  [5 cmH20] 5 cmH20 Pressure Support:  [12 cmH20] 12 cmH20 Plateau Pressure:  [14 cmH20-15 cmH20] 14 cmH20 INTAKE / OUTPUT:  Intake/Output Summary (Last 24 hours) at 04/24/14 0805 Last data filed at 04/24/14 0800  Gross per 24 hour  Intake   2798 ml  Output   1770 ml  Net   1028 ml    PHYSICAL EXAMINATION: General: Frail cachectic female comatose on vent Neuro: eyes opening variable, minor arm movement to pain, rass -4 HEENT:perr Cardiovascular: Normal heart sounds normotensive Lungs: Clear to auscultation bilaterally Abdomen: Soft, no r/g, BS wnl Musculoskeletal: no pedal edema Skin:  Appears intact anteriorly  LABS: PULMONARY  Recent Labs Lab 04/21/14 2155 04/22/14 0958  PHART 7.464* 7.441  PCO2ART 33.2* 30.1*  PO2ART 202.0* 128.0*  HCO3 23.8 20.2  TCO2 25 21.1  O2SAT 100.0 99.1    CBC  Recent Labs Lab 04/21/14 2221 04/23/14 0211 04/24/14 0225  HGB 11.2* 10.4* 9.6*  HCT 32.8* 31.5* 29.3*  WBC 7.8 8.9 6.7  PLT 210 261 241    COAGULATION  Recent Labs Lab 04/23/14 0211  INR 1.24    CARDIAC    Recent Labs Lab 04/17/14 0810 04/17/14 1430  TROPONINI <0.30 <0.30    Recent Labs Lab 04/22/14 0010  PROBNP 727.4*  CHEMISTRY  Recent Labs Lab 04/17/14 1036  04/19/14 0321  04/20/14 0235 04/21/14 2221 04/22/14 0010 04/23/14 0211 04/24/14 0225  NA 135*  < > 128*  < > 130* 133* 134* 136* 141  K 4.0  < > 4.1  < > 4.2 4.0 3.8 4.0 4.1  CL 97  < > 91*  < > 93* 97 99 103 107  CO2 22  < > 22  < > $R'21 20 19 'Lk$ 17* 20  GLUCOSE 155*  < > 196*  < > 212* 187* 192* 214* 226*  BUN 11  < > 13  < > $R'19 18 20 'mB$ 27* 26*  CREATININE 0.65  < > 0.62  < > 0.62 0.54 0.49* 0.58 0.50  CALCIUM 8.6  <  > 8.8  < > 8.9 8.5 8.2* 8.4 8.2*  MG 1.1*  --  1.4*  --   --  1.7 1.7  --   --   PHOS 3.0  --   --   --   --  2.6 2.4  --   --   < > = values in this interval not displayed. Estimated Creatinine Clearance: 48.8 mL/min (by C-G formula based on Cr of 0.5).   LIVER  Recent Labs Lab 04/17/14 1036 04/19/14 0321 04/20/14 0235 04/23/14 0211  AST $Re'20 15 17  'PQO$ --   ALT $Re'19 17 15  'SfJ$ --   ALKPHOS 55 61 60  --   BILITOT 0.6 0.6 0.5  --   PROT 6.5 6.9 6.9  --   ALBUMIN 3.4* 3.4* 3.2*  --   INR  --   --   --  1.24     INFECTIOUS  Recent Labs Lab 04/21/14 2144 04/22/14 0010  LATICACIDVEN 1.4 1.2     ENDOCRINE CBG (last 3)   Recent Labs  04/23/14 2330 04/24/14 0337 04/24/14 0738  GLUCAP 224* 204* 221*    IMAGING x48h Dg Chest Port 1 View  04/24/2014   CLINICAL DATA:  Acute respiratory failure  EXAM: PORTABLE CHEST - 1 VIEW  COMPARISON:  04/23/2014  FINDINGS: Interval advancement of endotracheal tube which is now 2.5 cm above the carina.  Decreased lung volume with increase in bibasilar atelectasis compared with the prior study. Negative for pneumonia or edema. Gastric tube in the stomach.  IMPRESSION: Endotracheal tube has been advanced with tip now 2.5 cm above the carina  Hypoventilation with increase in bibasilar atelectasis.   Electronically Signed   By: Franchot Gallo M.D.   On: 04/24/2014 07:23   Dg Chest Port 1 View  04/23/2014   CLINICAL DATA:  Acute respiratory failure  EXAM: PORTABLE CHEST - 1 VIEW  COMPARISON:  04/22/2014  FINDINGS: Cardiomediastinal silhouette is stable. NG tube with tip in distal stomach. Endotracheal tube with tip 4.5 cm above the carina. Mild left basilar atelectasis. No segmental infiltrate or pulmonary edema. Surgical clips are noted in left axilla. Stable chronic deformity of distal left clavicle. Prior vertebroplasty lower thoracic spine.  IMPRESSION: No segmental infiltrate or pulmonary edema. Stable support apparatus.   Electronically Signed   By:  Lahoma Crocker M.D.   On: 04/23/2014 10:20   Dg Abd Portable 1v  04/22/2014   CLINICAL DATA:  Orogastric tube placement  EXAM: PORTABLE ABDOMEN - 1 VIEW  COMPARISON:  04/16/2014  FINDINGS: Orogastric tube has been placed and is located within the stomach.  IMPRESSION: Tube within the stomach.   Electronically Signed   By: Skipper Cliche M.D.   On:  04/22/2014 12:25       ASSESSMENT / PLAN:  NEUROLOGIC A:   Status epilepticus with Coma >> no seizures since 12MIDnight on eeg SDH due to fall/head trauma 04/16/2014 P:   Management per Neuro Goal seizure free on eEG, continuous EEG monitoring now off Diprivan gtt d/c 11/29, rass deep on own Anti-epileptics per neuro - Keppra, Vimpat, Thiamine Will need d/w family about consideration comfort care?  PULMONARY OETT 04/21/2014 Acute respiratory failure due to status epilepticus and acute encephalopathy P:   Full Vent support Daily SBT -PS 12 attempting now, goal 2 hr, to PS 10 if able Unable to extubate with current neurostatus VAP protocol RASS goal 0  Last abg reviewed, may need slight reduction MV No role resp alk  CARDIOVASCULAR A:   Hx of HTN P:  Cardiac monitoring Labetalol 5 mg IV q 4 hours - hold for SBP <170, may need increase for this to be affective Consider MAp goal 80-105  RENAL A Oliguria Mild hyponatremia-->improved P:   BMP in am Avoid free water Urine output goal 15- 20 cc/hr kvo  GASTROINTESTINAL A: Cachexia with protein calorie malnutrition P:   TF per NG tube GI prophylaxis with famotidine 20 mg BID Look for BM in next 48 hr  HEMATOLOGY At risk for anemia of critical illness  P:  PRBC for hgb < 7gm% SCD's for DVT prophylasix With pos balance further assess cbc in am   INFECTIOUS A:   No evidence of infection  P:   Monitor off antibiotics Follow fever curve further Rocephin 11/23 >> 11/28  ENDOCRINE A:   DM, hyperglycemia P:   Ssi, maintain blood glucose < 180 Add lantus 10 Units  QHS Continue novolog SSI - 3 units Q 4 hours, no increase of this as of now  TODAY'S SUMMARY: wean attempts with higher PS, even balance goals, prognosis concerning, cont eeg to off  Ccm time 30 min  Lavon Paganini. Titus Mould, MD, [place] Pgr: [facility]

## 2014-04-24 NOTE — Progress Notes (Addendum)
Inpatient Diabetes Program Recommendations  AACE/ADA: New Consensus Statement on Inpatient Glycemic Control (2013)  Target Ranges:  Prepandial:   less than 140 mg/dL      Peak postprandial:   less than 180 mg/dL (1-2 hours)      Critically ill patients:  140 - 180 mg/dL     Results for Diane Stevenson, Diane Stevenson (MRN [id number]) as of 04/24/2014 07:32  Ref. Range 04/22/2014 23:26 04/23/2014 03:37 04/23/2014 07:50 04/23/2014 11:32 04/23/2014 15:29 04/23/2014 19:35  Glucose-Capillary Latest Range: 70-99 mg/dL 207 (H) 224 (H) 212 (H) 254 (H) 237 (H) 237 (H)    Results for Diane Stevenson, Diane Stevenson (MRN [id number]) as of 04/24/2014 07:32  Ref. Range 04/23/2014 23:30 04/24/2014 03:37  Glucose-Capillary Latest Range: 70-99 mg/dL 224 (H) 204 (H)     Current Insulin Orders: Novolog Moderate SSI Q4 hours   Patient having elevated glucose levels.  Getting Vital High Protein Tube feeds at 50 cc/hour.  Patient received a total of 33 units Novolog SSI coverage yesterday (11/29).    MD- Please consider the following:  1. Add Lantus 10 units QHS 2. Add Novolog tube feed coverage- Novolog 3 units Q4 hours     Will follow Wyn Quaker RN, MSN, CDE Diabetes Coordinator Inpatient Diabetes Program Team Pager: [phone] (8a-10p)

## 2014-04-24 NOTE — Progress Notes (Signed)
STROKE TEAM PROGRESS NOTE   HISTORY Diane Stevenson is an 80 y.o. female with a past medical history significant for HTN, DM type II, bilateral breast cancer, DJD, admitted to [facility] due to right hemispheric and right tentorium post traumatic SDH after a fall who has been experiencing fluctuating mental state changes and new onset seizures today. Patient follow by neurosurgery and no aggressive neurosurgical intervention recommended. Nursing staff reports off and on change in mental status, and recurrent but brief episodes of uncontrollable jerkiness involving the left leg greater than the right leg but with preservation of consciousness. Of note, she received some narcotics yesterday. Patient's fluctuation on mental status prompted a repeat CT brain yesterday which showed unchanged SDH in the locations described above. At this moment, patient is awake, follows simple commands, doesn't engage in conversation, and is having bouts of short lasting clonic movements involving the left greater than the right leg..  Serologies showed hyponatremia that is thought to be chronic.  The patient was not treated with TPA secondary to posttraumatic subdural hematoma.  SUBJECTIVE (INTERVAL HISTORY): Patient has been weaned off of the Propofol. No abnormal movements noted. On continuous EEG. She is more alert, opening eyes.    OBJECTIVE Temp:  [98.4 F (36.9 C)-100.7 F (38.2 C)] 100.5 F (38.1 C) (11/30 1155) Pulse Rate:  [82-101] 88 (11/30 1200) Cardiac Rhythm:  [-] Normal sinus rhythm (11/30 0800) Resp:  [12-18] 13 (11/30 1200) BP: (128-175)/(57-76) 156/69 mmHg (11/30 1200) SpO2:  [96 %-100 %] 100 % (11/30 1200) FiO2 (%):  [30 %-40 %] 30 % (11/30 1200) Weight:  [148 lb 5.9 oz (67.3 kg)] 148 lb 5.9 oz (67.3 kg) (11/30 0400)   Recent Labs Lab 04/23/14 1935 04/23/14 2330 04/24/14 0337 04/24/14 0738 04/24/14 1154  GLUCAP 237* 224* 204* 221* 274*    Recent Labs Lab 04/19/14 0321  04/20/14 0235  04/21/14 2221 04/22/14 0010 04/23/14 0211 04/24/14 0225  NA 128*  < > 130* 133* 134* 136* 141  K 4.1  < > 4.2 4.0 3.8 4.0 4.1  CL 91*  < > 93* 97 99 103 107  CO2 22  < > 21 20 19  17* 20  GLUCOSE 196*  < > 212* 187* 192* 214* 226*  BUN 13  < > 19 18 20  27* 26*  CREATININE 0.62  < > 0.62 0.54 0.49* 0.58 0.50  CALCIUM 8.8  < > 8.9 8.5 8.2* 8.4 8.2*  MG 1.4*  --   --  1.7 1.7  --   --   PHOS  --   --   --  2.6 2.4  --   --   < > = values in this interval not displayed.  Recent Labs Lab 04/19/14 0321 04/20/14 0235  AST 15 17  ALT 17 15  ALKPHOS 61 60  BILITOT 0.6 0.5  PROT 6.9 6.9  ALBUMIN 3.4* 3.2*    Recent Labs Lab 04/19/14 0321 04/20/14 0235 04/21/14 2221 04/23/14 0211 04/24/14 0225  WBC 11.9* 8.0 7.8 8.9 6.7  NEUTROABS 10.1*  --   --   --  5.0  HGB 12.0 11.5* 11.2* 10.4* 9.6*  HCT 35.1* 33.7* 32.8* 31.5* 29.3*  MCV 95.9 95.5 98.2 97.8 99.0  PLT 226 209 210 261 241    Recent Labs Lab 04/17/14 1430 04/21/14 2221 04/22/14 0010  CKTOTAL  --  27 25  CKMB  --  <1.0 <1.0  TROPONINI <0.30  --   --     Recent Labs  04/23/14 0211  LABPROT 15.7*  INR 1.24   No results for input(s): COLORURINE, LABSPEC, PHURINE, GLUCOSEU, HGBUR, BILIRUBINUR, KETONESUR, PROTEINUR, UROBILINOGEN, NITRITE, LEUKOCYTESUR in the last 72 hours.  Invalid input(s): APPERANCEUR     Component Value Date/Time   CHOL 122 04/19/2014 0321   TRIG 100 04/21/2014 2144   HDL 38* 04/19/2014 0321   CHOLHDL 3.2 04/19/2014 0321   VLDL 16 04/19/2014 0321   LDLCALC 68 04/19/2014 0321   Lab Results  Component Value Date   HGBA1C 6.2* 04/21/2014   No results found for: LABOPIA, COCAINSCRNUR, LABBENZ, AMPHETMU, THCU, LABBARB  No results for input(s): ETH in the last 168 hours.    EEG 04/22/2014 This is an abnormal 24 hr video-EEG recording.  Significant findings include: 1. Recurrent focal seizures, primarily nonconvulsive, arising from from the right frontal lobe; none after midnight  (04/22/2014) 2. Right frontal periodic epileptiform discharges (PLEDS) 3. Diffuse slowing on the left, low voltage polymorphic theta/delta 4. Suppression, right hemisphere   2-D echocardiogram  04/21/2014 Left ventricle: The cavity size was normal. Wall thickness was increased in a pattern of mild LVH. Systolic function was normal. The estimated ejection fraction was in the range of 60% to 65%. Wall motion was normal; there were no regional wall motion abnormalities. Doppler parameters are consistent with abnormal left ventricular relaxation (grade 1 diastolic dysfunction). The E/e&' ratio is between 8-15, suggesting indeterminate LV filling pressure. - Left atrium: The atrium was normal in size. - Tricuspid valve: There was mild regurgitation. - Pulmonary arteries: PA peak pressure: 36 mm Hg (S).  Impressions:  - LVEF 60-65%, mild LVH, normal wall motion, diastolic dysfunction, indeterminate LV filling pressure. mild TR, RVSP 36 mmHg.    Ct Head Wo Contrast 04/19/2014    1. Stable since 04/18/2014 parafalcine and right hemispheric subdural hematoma. Stable associated mass effect including partial effacement of the right lateral ventricle.  2. No new intracranial abnormality.      Mr Brain Wo Contrast 04/20/2014    1. Subdural hematoma re - identified in the right hemisphere and stable from recent CTs, including lobulated component along the interhemispheric fissure. Small component in the right posterior fossa.  2. Subtle signal abnormality in the posterior right cingulate gyrus adjacent to the parafalcine hematoma in this setting is suggestive of gyral edema due to recent seizure.  3. Superimposed small acute lacunar infarct in the left thalamus. Possible second tiny lacune in the left occipital pole. No hemorrhage or mass effect associated with these.  4. Underlying chronic small vessel disease.       PHYSICAL EXAM Elderly Caucasian lady who is  unresponsive, intubated. Does not open eyes to sternal rub. Not following commands. Head is nontraumatic. Neck is supple without bruit. Cardiac exam no murmur or gallop. Intubated.  Distal pulses are well felt. Neurological Exam : Eyes closed. Not following commands. Pupils equal, sluggish. Conjugate gaze. Dolls eye movements sluggish. Fundi not visualized.Withdraws to painful stimuli bilateral lowers not uppers. Plantars mute.    ASSESSMENT/PLAN Diane Stevenson is a 80 y.o. female with history of hypertension, diabetes mellitus, bilateral breast cancer, and DJD presenting with a posttraumatic subdural hematoma. She did did not receive intravenous TPA secondary to hemorrhage.   Stroke:  Non dominant right hemisphere subdural hematoma with multiple lacunar infarcts-left thalamus and right occipital pole. And focal motor seizures.  Resultant - Weaned from propofol, continuous EEG, remains obtunded and barely responsive. Prognosis guarded given persistent depressed mental status despite no sedation for nearly 48  hrs, being on 2 AEDs and continuous EEG not showing persistent seizures  MRI  as above  MRA  not ordered  Carotid Doppler  pending  2D Echo  as above - unremarkable  LDL 68   HgbA1c 6.2  SCDs for VTE prophylaxis  Diet NPO time specified   aspirin 81 mg orally every day prior to admission, now on no antithrombotics  Ongoing aggressive stroke risk factor management  Therapy recommendations: Pending   Disposition:  Pending   Hypertension  Home meds: Norvasc 2.5 mg daily and Altace 10 mg daily   Stable   Diabetes  HgbA1c 6.2  goal < 7.0  Controlled   Other Stroke Risk Factors  Advanced age   Other Active Problems  New-onset seizures - EEG - abnormal as above - on Keppra and Vimpat - weaned off of propofol, continuous eeg.  Small lacunar strokes by MRI  Mild anemia  Hyponatremia  Other Pertinent History   I have personally examined this patient,  reviewed notes, independently viewed imaging studies, participated in medical decision making and plan of care. I have made any additions or clarifications directly to the above note. Agree with note above. Plan to ask neurohospitalist team to takeover and follow this patient as active neurological issue remains seizure and altered mental status with lack of improvement in mental status in setting of recent seizures, abnormal EEG.The tiny lacunar infarcts on MRI are unlikely to explain this and may be incidental.Stroke team will sign off. Call for questions.D/W Dr Janann Colonel who agrees. Antony Contras, MD Medical Director [facility] Pager: [phone] 04/24/2014 1:36 PM  Hospital day # 8    This patient is critically ill and at significant risk of neurological worsening, death and care requires constant monitoring of vital signs, hemodynamics,respiratory and cardiac monitoring,review of multiple databases, neurological assessment, discussion with family, other specialists and medical decision making of high complexity.I have made any additions or clarifications directly to the above note.  I spent 40 minutes of neurocritical care time  in the care of  this patient.   Personally examined patient and images, agree with history, physical, neuro exam as stated above. Agree with assessment and plan.   Antony Contras, MD       To contact Stroke Continuity provider, please refer to [email or web address] After hours, contact General Neurology

## 2014-04-25 ENCOUNTER — Inpatient Hospital Stay (HOSPITAL_COMMUNITY): Payer: Medicare Other

## 2014-04-25 DIAGNOSIS — E872 Acidosis: Secondary | ICD-10-CM

## 2014-04-25 LAB — CBC WITH DIFFERENTIAL/PLATELET
BASOS PCT: 0 % (ref 0–1)
Basophils Absolute: 0 10*3/uL (ref 0.0–0.1)
EOS ABS: 0.1 10*3/uL (ref 0.0–0.7)
EOS PCT: 1 % (ref 0–5)
HEMATOCRIT: 30.3 % — AB (ref 36.0–46.0)
HEMOGLOBIN: 9.7 g/dL — AB (ref 12.0–15.0)
LYMPHS ABS: 1.1 10*3/uL (ref 0.7–4.0)
Lymphocytes Relative: 14 % (ref 12–46)
MCH: 32.1 pg (ref 26.0–34.0)
MCHC: 32 g/dL (ref 30.0–36.0)
MCV: 100.3 fL — AB (ref 78.0–100.0)
MONO ABS: 0.7 10*3/uL (ref 0.1–1.0)
Monocytes Relative: 9 % (ref 3–12)
Neutro Abs: 5.9 10*3/uL (ref 1.7–7.7)
Neutrophils Relative %: 76 % (ref 43–77)
Platelets: 261 10*3/uL (ref 150–400)
RBC: 3.02 MIL/uL — AB (ref 3.87–5.11)
RDW: 14.1 % (ref 11.5–15.5)
WBC: 7.9 10*3/uL (ref 4.0–10.5)

## 2014-04-25 LAB — GLUCOSE, CAPILLARY
GLUCOSE-CAPILLARY: 228 mg/dL — AB (ref 70–99)
GLUCOSE-CAPILLARY: 230 mg/dL — AB (ref 70–99)
GLUCOSE-CAPILLARY: 237 mg/dL — AB (ref 70–99)
GLUCOSE-CAPILLARY: 256 mg/dL — AB (ref 70–99)
GLUCOSE-CAPILLARY: 266 mg/dL — AB (ref 70–99)
Glucose-Capillary: 245 mg/dL — ABNORMAL HIGH (ref 70–99)
Glucose-Capillary: 220 mg/dL — ABNORMAL HIGH (ref 70–99)
Glucose-Capillary: 229 mg/dL — ABNORMAL HIGH (ref 70–99)

## 2014-04-25 LAB — COMPREHENSIVE METABOLIC PANEL
ALK PHOS: 128 U/L — AB (ref 39–117)
ALT: 54 U/L — ABNORMAL HIGH (ref 0–35)
AST: 35 U/L (ref 0–37)
Albumin: 2.3 g/dL — ABNORMAL LOW (ref 3.5–5.2)
Anion gap: 12 (ref 5–15)
BUN: 31 mg/dL — ABNORMAL HIGH (ref 6–23)
CALCIUM: 8.7 mg/dL (ref 8.4–10.5)
CO2: 23 mEq/L (ref 19–32)
Chloride: 104 mEq/L (ref 96–112)
Creatinine, Ser: 0.52 mg/dL (ref 0.50–1.10)
GFR calc non Af Amer: 87 mL/min — ABNORMAL LOW (ref >90)
Glucose, Bld: 288 mg/dL — ABNORMAL HIGH (ref 70–99)
POTASSIUM: 4.2 meq/L (ref 3.7–5.3)
SODIUM: 139 meq/L (ref 137–147)
TOTAL PROTEIN: 6.2 g/dL (ref 6.0–8.3)
Total Bilirubin: 0.5 mg/dL (ref 0.3–1.2)

## 2014-04-25 MED ORDER — INSULIN GLARGINE 100 UNIT/ML ~~LOC~~ SOLN
15.0000 [IU] | Freq: Every day | SUBCUTANEOUS | Status: DC
  Filled 2014-04-25: qty 0.15

## 2014-04-25 MED ORDER — DOCUSATE SODIUM 100 MG PO CAPS
100.0000 mg | ORAL_CAPSULE | Freq: Two times a day (BID) | ORAL | Status: DC
  Filled 2014-04-25 (×13): qty 1

## 2014-04-25 MED ORDER — POLYETHYLENE GLYCOL 3350 17 G PO PACK
17.0000 g | PACK | Freq: Every day | ORAL | Status: DC
  Administered 2014-04-25 – 2014-04-30 (×3): 17 g via ORAL
  Filled 2014-04-25 (×7): qty 1

## 2014-04-25 NOTE — Progress Notes (Signed)
Inpatient Diabetes Program Recommendations  AACE/ADA: New Consensus Statement on Inpatient Glycemic Control (2013)  Target Ranges:  Prepandial:   less than 140 mg/dL      Peak postprandial:   less than 180 mg/dL (1-2 hours)      Critically ill patients:  140 - 180 mg/dL    Results for KYNDEL, EGGER (MRN [id number]) as of 04/25/2014 07:16  Ref. Range 04/23/2014 23:30 04/24/2014 03:37 04/24/2014 07:38 04/24/2014 11:54 04/24/2014 15:41 04/24/2014 21:15  Glucose-Capillary Latest Range: 70-99 mg/dL 224 (H) 204 (H) 221 (H) 274 (H) 238 (H) 237 (H)    Results for JOSCLYN, ROSALES (MRN [id number]) as of 04/25/2014 07:16  Ref. Range 04/25/2014 00:04 04/25/2014 03:24  Glucose-Capillary Latest Range: 70-99 mg/dL 256 (H) 266 (H)      Current Insulin Orders: Novolog Moderate SSI Q4 hours      Lantus 10 units daily (started yest at 12 noon)   Patient having elevated glucose levels. Getting Vital High Protein Tube feeds at 50 cc/hour.  Patient received a total of 33 units Novolog SSI coverage yesterday (11/30).    MD- Please consider adding Novolog tube feed coverage- Novolog 3 units Q4 hours (hold if tube feeds held for any reason)  (Currently only has orders for Lantus and Novolog SSI Q4 hours)   Will follow Wyn Quaker RN, MSN, CDE Diabetes Coordinator Inpatient Diabetes Program Team Pager: [phone] (8a-10p)

## 2014-04-25 NOTE — Progress Notes (Signed)
STROKE TEAM PROGRESS NOTE   SUBJECTIVE: Patient has been weaned off of the Propofol and no benzo in >72hrs. No abnormal movements noted. Repeat head CT shows slight decrease in the prominence of the SDH. Per RN no overnight events  HISTORY Diane Stevenson is an 80 y.o. female with a past medical history significant for HTN, DM type II, bilateral breast cancer, DJD, admitted to [facility] due to right hemispheric and right tentorium post traumatic SDH after a fall who has been experiencing fluctuating mental state changes and new onset seizures today. Patient follow by neurosurgery and no aggressive neurosurgical intervention recommended. Nursing staff reports off and on change in mental status, and recurrent but brief episodes of uncontrollable jerkiness involving the left leg greater than the right leg but with preservation of consciousness. Of note, she received some narcotics yesterday. Patient's fluctuation on mental status prompted a repeat CT brain yesterday which showed unchanged SDH in the locations described above. At this moment, patient is awake, follows simple commands, doesn't engage in conversation, and is having bouts of short lasting clonic movements involving the left greater than the right leg..  Serologies showed hyponatremia that is thought to be chronic.  The patient was not treated with TPA secondary to posttraumatic subdural hematoma.  Continuous EEG: 1. No further seizures noted  2. Right fronto-temporal spike wave discharges 3. Multi-regional spike-wave and sharp-wave discharges on the left (left frontocentral, left frontotemporal and posterior temporal-parietal, primarily) 4. Moderate diffuse slowing, polymorphic delta/theta  OBJECTIVE Temp:  [99.2 F (37.3 C)-100.5 F (38.1 C)] 99.5 F (37.5 C) (12/01 0730) Pulse Rate:  [82-93] 82 (12/01 0800) Cardiac Rhythm:  [-] Normal sinus rhythm (12/01 0730) Resp:  [13-22] 19 (12/01 0800) BP: (110-161)/(47-70) 132/62 mmHg  (12/01 0800) SpO2:  [99 %-100 %] 99 % (12/01 0800) FiO2 (%):  [30 %-40 %] 30 % (12/01 0746) Weight:  [68.2 kg (150 lb 5.7 oz)] 68.2 kg (150 lb 5.7 oz) (12/01 0500)   Recent Labs Lab 04/24/14 1541 04/24/14 2115 04/25/14 0004 04/25/14 0324 04/25/14 0750  GLUCAP 238* 237* 256* 266* 228*    Recent Labs Lab 04/19/14 0321  04/21/14 2221 04/22/14 0010 04/23/14 0211 04/24/14 0225 04/25/14 0216  NA 128*  < > 133* 134* 136* 141 139  K 4.1  < > 4.0 3.8 4.0 4.1 4.2  CL 91*  < > 97 99 103 107 104  CO2 22  < > 20 19 17* 20 23  GLUCOSE 196*  < > 187* 192* 214* 226* 288*  BUN 13  < > 18 20 27* 26* 31*  CREATININE 0.62  < > 0.54 0.49* 0.58 0.50 0.52  CALCIUM 8.8  < > 8.5 8.2* 8.4 8.2* 8.7  MG 1.4*  --  1.7 1.7  --   --   --   PHOS  --   --  2.6 2.4  --   --   --   < > = values in this interval not displayed.  Recent Labs Lab 04/19/14 0321 04/20/14 0235 04/25/14 0216  AST 15 17 35  ALT 17 15 54*  ALKPHOS 61 60 128*  BILITOT 0.6 0.5 0.5  PROT 6.9 6.9 6.2  ALBUMIN 3.4* 3.2* 2.3*    Recent Labs Lab 04/19/14 0321 04/20/14 0235 04/21/14 2221 04/23/14 0211 04/24/14 0225 04/25/14 0216  WBC 11.9* 8.0 7.8 8.9 6.7 7.9  NEUTROABS 10.1*  --   --   --  5.0 5.9  HGB 12.0 11.5* 11.2* 10.4* 9.6* 9.7*  HCT 35.1* 33.7* 32.8* 31.5* 29.3* 30.3*  MCV 95.9 95.5 98.2 97.8 99.0 100.3*  PLT 226 209 210 261 241 261    Recent Labs Lab 04/21/14 2221 04/22/14 0010  CKTOTAL 27 25  CKMB <1.0 <1.0    Recent Labs  04/23/14 0211  LABPROT 15.7*  INR 1.24   No results for input(s): COLORURINE, LABSPEC, PHURINE, GLUCOSEU, HGBUR, BILIRUBINUR, KETONESUR, PROTEINUR, UROBILINOGEN, NITRITE, LEUKOCYTESUR in the last 72 hours.  Invalid input(s): APPERANCEUR     Component Value Date/Time   CHOL 122 04/19/2014 0321   TRIG 100 04/21/2014 2144   HDL 38* 04/19/2014 0321   CHOLHDL 3.2 04/19/2014 0321   VLDL 16 04/19/2014 0321   LDLCALC 68 04/19/2014 0321   Lab Results  Component Value Date    HGBA1C 6.2* 04/21/2014   No results found for: LABOPIA, COCAINSCRNUR, LABBENZ, AMPHETMU, THCU, LABBARB  No results for input(s): ETH in the last 168 hours.    EEG 04/22/2014 This is an abnormal 24 hr video-EEG recording.  Significant findings include: 1. Recurrent focal seizures, primarily nonconvulsive, arising from from the right frontal lobe; none after midnight (04/22/2014) 2. Right frontal periodic epileptiform discharges (PLEDS) 3. Diffuse slowing on the left, low voltage polymorphic theta/delta 4. Suppression, right hemisphere   2-D echocardiogram  04/21/2014 Left ventricle: The cavity size was normal. Wall thickness was increased in a pattern of mild LVH. Systolic function was normal. The estimated ejection fraction was in the range of 60% to 65%. Wall motion was normal; there were no regional wall motion abnormalities. Doppler parameters are consistent with abnormal left ventricular relaxation (grade 1 diastolic dysfunction). The E/e&' ratio is between 8-15, suggesting indeterminate LV filling pressure. - Left atrium: The atrium was normal in size. - Tricuspid valve: There was mild regurgitation. - Pulmonary arteries: PA peak pressure: 36 mm Hg (S).  Impressions:  - LVEF 60-65%, mild LVH, normal wall motion, diastolic dysfunction, indeterminate LV filling pressure. mild TR, RVSP 36 mmHg.    Ct Head Wo Contrast 04/19/2014    1. Stable since 04/18/2014 parafalcine and right hemispheric subdural hematoma. Stable associated mass effect including partial effacement of the right lateral ventricle.  2. No new intracranial abnormality.      Mr Brain Wo Contrast 04/20/2014    1. Subdural hematoma re - identified in the right hemisphere and stable from recent CTs, including lobulated component along the interhemispheric fissure. Small component in the right posterior fossa.  2. Subtle signal abnormality in the posterior right cingulate gyrus adjacent to  the parafalcine hematoma in this setting is suggestive of gyral edema due to recent seizure.  3. Superimposed small acute lacunar infarct in the left thalamus. Possible second tiny lacune in the left occipital pole. No hemorrhage or mass effect associated with these.  4. Underlying chronic small vessel disease.       PHYSICAL EXAM Elderly Caucasian lady who is unresponsive, intubated. No eye opening. Not following commands. Head is nontraumatic. Neck is supple without bruit. Cardiac exam no murmur or gallop. Intubated.  Distal pulses are well felt. Neurological Exam : Not following commands. Pupils equal, sluggish. Conjugate gaze. Withdraws to painful stimuli LUE and  bilateral lowers L>R. Plantars mute.    ASSESSMENT/PLAN Diane Stevenson is a 80 y.o. female with history of hypertension, diabetes mellitus, bilateral breast cancer, and DJD presenting with a posttraumatic subdural hematoma, status epilepticus. She did did not receive intravenous TPA secondary to hemorrhage.   Current active problem is encephalopathy of unclear etiology.  Patient has known SDH, new infarcts on MRI and new onset seizure since admission. She has been off sedation for >72hrs but continues to be obtunded. Continuous EEG shows right fronto-temporal spike wave discharges and multiregional spike wave and sharp-wave discharges on the left. There is moderate diffuse slowing.   Repeat head CT shows slight improvement. Patient has failed to show improvement in mental status even with being off all sedation for >72hours. Guarded prognosis at this time. Will need to discuss goals of care with family.      This patient is critically ill and at significant risk of neurological worsening, death and care requires constant monitoring of vital signs, hemodynamics,respiratory and cardiac monitoring,review of multiple databases, neurological assessment, discussion with family, other specialists and medical decision making of high  complexity. I spent 35 inutes of neurocritical care time in the care of this patient.   Jim Like, DO [facility] [phone]  If 7pm- 7am, please page neurology on call as listed in [place].    To contact Stroke Continuity provider, please refer to [email or web address] After hours, contact General Neurology

## 2014-04-25 NOTE — Progress Notes (Signed)
Patient ID: Diane Stevenson, female   DOB: Feb 25, 1930, 80 y.o.   MRN: [id number] Ration remains on a ventilator. She is minimally responsive to central pain. There is some grimace noted. Has no spontaneous eye opening. The patient has not had an active seizure in the last couple of days and she's not had any sedation now for 72 hours.  Family conference noted for tomorrow. Prognosis is very guarded.

## 2014-04-25 NOTE — Progress Notes (Signed)
Dr. Titus Mould updated pt's husband about her prognosis and code status was changed to DNR.   A family meeting has been set with the husband, daughter, Dr. Titus Mould & Dr. Sabino Donovan for Wednesday, 04/26/14 at 1500.

## 2014-04-25 NOTE — Plan of Care (Signed)
Problem: Phase I Progression Outcomes Goal: ARDS Protocol initiated if indicated Outcome: Not Applicable Date Met:  24/11/46 Goal: Sepsis Protocol initiated if indicated Outcome: Not Applicable Date Met:  43/14/27 Goal: Code status addressed with pt/family Outcome: Completed/Met Date Met:  04/25/14

## 2014-04-25 NOTE — Progress Notes (Signed)
PULMONARY / CRITICAL CARE MEDICINE   Name: Diane Stevenson MRN: [id number] DOB: [birth year]   LOS 8 days PCP Irven Shelling, MD   ADMISSION DATE:  04/16/2014 CONSULTATION DATE:  04/21/14  REFERRING MD :  Dr Dorian Pod of neuro and Dr Keon Puffer [facility]  CC:  Status epilepticus with risk for respiratory compromise  INITIAL PRESENTATION:  80 year old female who is cachectic and multiple medical problems including breast cancer 1997 and type 2 diabetes sustained subdural hematoma in the right frontal lobe FALCINE SDH after a fall on 04/16/2014. In addition she also sustained 3 fractures of T1, T6 and T10 above her kyphoplasty's at T11-T12 along with nondisplaced right 11th rib fracture. All this  was deemed nonoperative by neurosurgery and she was admitted to stepdown ICU by the tried hospitalist service. She had acute mental status change and CT head showed expansion of the subdural hematoma. Then on 04/20/2014 she developed in association wit chronic hyponatremia new onset seizures of clonic movements involving the left greater than the right leg and also the left upper extremity. EEG on 04/21/2014 showed status epilepticus despite medical management and patient was moved to the intensive care unit under the critical care medicine service for respiratory compromise, and her tracheal intubation and acute respiratory failure and controlling seizures with DIPRIVAN. PCCM will be primary from 04/21/14  SIGNIFICANT EVENTS: 04/16/2014 - admit for fall with acute subdural hematoma - non-surgical 04/21/14 - ICU transfer, PCCM primary and Intubation for diprivan gtt and continuous EEG 04/22/14 - off diprivan gtt 12/1 ct head>>> slight decrease SDH  SUBJECTIVE:  Not waking up  VITAL SIGNS: Temp:  [98.4 F (36.9 C)-100.7 F (38.2 C)] 99.8 F (37.7 C) (11/30 0739) Pulse Rate:  [82-101] 96 (11/30 0748) Resp:  [10-18] 16 (11/30 0748) BP: (128-181)/(57-74) 167/74 mmHg (11/30  0748) SpO2:  [96 %-100 %] 96 % (11/30 0752) FiO2 (%):  [30 %-40 %] 40 % (11/30 0752) Weight:  [148 lb 5.9 oz (67.3 kg)] 148 lb 5.9 oz (67.3 kg) (11/30 0400) HEMODYNAMICS:   VENTILATOR SETTINGS: Vent Mode:  [-] CPAP FiO2 (%):  [30 %-40 %] 40 % Set Rate:  [15 bmp] 15 bmp Vt Set:  [450 mL] 450 mL PEEP:  [5 cmH20] 5 cmH20 Pressure Support:  [12 cmH20] 12 cmH20 Plateau Pressure:  [14 cmH20-15 cmH20] 14 cmH20 INTAKE / OUTPUT:  Intake/Output Summary (Last 24 hours) at 04/24/14 0805 Last data filed at 04/24/14 0800  Gross per 24 hour  Intake   2798 ml  Output   1770 ml  Net   1028 ml    PHYSICAL EXAMINATION: General: Frail cachectic female comatose on vent Neuro: eyes opening variable, WD pain lowers and left arm HEENT:perr 3 mm Cardiovascular: S1 S2 RRR Lungs: CTA Abdomen: Soft, no r/g, BS wnl Musculoskeletal: no pedal edema Skin:  Appears intact anteriorly  LABS: PULMONARY  Recent Labs Lab 04/21/14 2155 04/22/14 0958  PHART 7.464* 7.441  PCO2ART 33.2* 30.1*  PO2ART 202.0* 128.0*  HCO3 23.8 20.2  TCO2 25 21.1  O2SAT 100.0 99.1    CBC  Recent Labs Lab 04/21/14 2221 04/23/14 0211 04/24/14 0225  HGB 11.2* 10.4* 9.6*  HCT 32.8* 31.5* 29.3*  WBC 7.8 8.9 6.7  PLT 210 261 241    COAGULATION  Recent Labs Lab 04/23/14 0211  INR 1.24    CARDIAC    Recent Labs Lab 04/17/14 0810 04/17/14 1430  TROPONINI <0.30 <0.30    Recent Labs Lab 04/22/14 0010  PROBNP 727.4*  CHEMISTRY  Recent Labs Lab 04/17/14 1036  04/19/14 0321  04/20/14 0235 04/21/14 2221 04/22/14 0010 04/23/14 0211 04/24/14 0225  NA 135*  < > 128*  < > 130* 133* 134* 136* 141  K 4.0  < > 4.1  < > 4.2 4.0 3.8 4.0 4.1  CL 97  < > 91*  < > 93* 97 99 103 107  CO2 22  < > 22  < > 21 20 19  17* 20  GLUCOSE 155*  < > 196*  < > 212* 187* 192* 214* 226*  BUN 11  < > 13  < > 19 18 20  27* 26*  CREATININE 0.65  < > 0.62  < > 0.62 0.54 0.49* 0.58 0.50  CALCIUM 8.6  < > 8.8  < > 8.9  8.5 8.2* 8.4 8.2*  MG 1.1*  --  1.4*  --   --  1.7 1.7  --   --   PHOS 3.0  --   --   --   --  2.6 2.4  --   --   < > = values in this interval not displayed. Estimated Creatinine Clearance: 48.8 mL/min (by C-G formula based on Cr of 0.5).   LIVER  Recent Labs Lab 04/17/14 1036 04/19/14 0321 04/20/14 0235 04/23/14 0211  AST 20 15 17   --   ALT 19 17 15   --   ALKPHOS 55 61 60  --   BILITOT 0.6 0.6 0.5  --   PROT 6.5 6.9 6.9  --   ALBUMIN 3.4* 3.4* 3.2*  --   INR  --   --   --  1.24     INFECTIOUS  Recent Labs Lab 04/21/14 2144 04/22/14 0010  LATICACIDVEN 1.4 1.2     ENDOCRINE CBG (last 3)   Recent Labs  04/23/14 2330 04/24/14 0337 04/24/14 0738  GLUCAP 224* 204* 221*    IMAGING x48h Dg Chest Port 1 View  04/24/2014   CLINICAL DATA:  Acute respiratory failure  EXAM: PORTABLE CHEST - 1 VIEW  COMPARISON:  04/23/2014  FINDINGS: Interval advancement of endotracheal tube which is now 2.5 cm above the carina.  Decreased lung volume with increase in bibasilar atelectasis compared with the prior study. Negative for pneumonia or edema. Gastric tube in the stomach.  IMPRESSION: Endotracheal tube has been advanced with tip now 2.5 cm above the carina  Hypoventilation with increase in bibasilar atelectasis.   Electronically Signed   By: Franchot Gallo M.D.   On: 04/24/2014 07:23   Dg Chest Port 1 View  04/23/2014   CLINICAL DATA:  Acute respiratory failure  EXAM: PORTABLE CHEST - 1 VIEW  COMPARISON:  04/22/2014  FINDINGS: Cardiomediastinal silhouette is stable. NG tube with tip in distal stomach. Endotracheal tube with tip 4.5 cm above the carina. Mild left basilar atelectasis. No segmental infiltrate or pulmonary edema. Surgical clips are noted in left axilla. Stable chronic deformity of distal left clavicle. Prior vertebroplasty lower thoracic spine.  IMPRESSION: No segmental infiltrate or pulmonary edema. Stable support apparatus.   Electronically Signed   By: Lahoma Crocker M.D.    On: 04/23/2014 10:20   Dg Abd Portable 1v  04/22/2014   CLINICAL DATA:  Orogastric tube placement  EXAM: PORTABLE ABDOMEN - 1 VIEW  COMPARISON:  04/16/2014  FINDINGS: Orogastric tube has been placed and is located within the stomach.  IMPRESSION: Tube within the stomach.   Electronically Signed   By: Skipper Cliche M.D.   On:  04/22/2014 12:25       ASSESSMENT / PLAN:  NEUROLOGIC A:   Status epilepticus with Coma  SDH due to fall/head trauma 04/16/2014 P:   Management per Neuro - depicts a poor prognosis EEG off continuous no focus No continuous sdedation Anti-epileptics per neuro - Keppra, Vimpat, Thiamine Will need d/w family about consideration comfort care, will discuss with family 12.2 Repeat CT head reviewed  PULMONARY OETT 04/21/2014 Acute respiratory failure due to status epilepticus and acute encephalopathy P:   Wean attempt PS 5, cpap5 ,goal 2 hrs Neuro status precludes extubation Some cough noted Will discuss with family consideration DNI upon planned extubation  CARDIOVASCULAR A:   Hx of HTN P:  Cardiac monitoring Labetalol 5 mg IV q rh prn Consider MAp goal 80-105  RENAL A Mild hyponatremia-->improved P:   bmet in am  euvolemic  GASTROINTESTINAL A: Cachexia with protein calorie malnutrition P:   TF per NG tube GI prophylaxis with famotidine 20 mg BID Add colace, mirlax  HEMATOLOGY At risk for anemia of critical illness  P:  scd Cbc in am   INFECTIOUS A:   No evidence of infection  P:   Monitor off antibiotics Follow fever curve further, none noted Rocephin 11/23 >> 11/28  ENDOCRINE A:   DM, hyperglycemia P:   Ssi, maintain blood glucose < 180 lantus 10 Units QHS, increase to 15 Continue novolog SSI - 3 units Q 4 hours, no increase of this as of now  TODAY'S SUMMARY: weaning better, poor neuro outcome likley per neuro, chem in am, even balance goals, likley poor outceom from status as bleed smaller slight, control glu  better  Ccm time 30 min  Lavon Paganini. Titus Mould, MD, [place] Pgr: [facility]

## 2014-04-25 NOTE — Progress Notes (Signed)
Patient ID: Diane Stevenson, female   DOB: March 07, 1930, 80 y.o.   MRN: [id number] I have had extensive discussions with family. We discussed patients current circumstances and organ failures. We also discussed patient's prior wishes under circumstances such as this. Family has decided to NOT perform resuscitation if arrest but to continue current medical support for now. Meet wed 3-4 pm to discuss further Diane Stevenson. Titus Mould, MD, [place] Pgr: [facility] \]

## 2014-04-26 ENCOUNTER — Inpatient Hospital Stay (HOSPITAL_COMMUNITY): Payer: Medicare Other

## 2014-04-26 DIAGNOSIS — E872 Acidosis: Secondary | ICD-10-CM | POA: Insufficient documentation

## 2014-04-26 DIAGNOSIS — J9602 Acute respiratory failure with hypercapnia: Secondary | ICD-10-CM | POA: Insufficient documentation

## 2014-04-26 LAB — GLUCOSE, CAPILLARY
GLUCOSE-CAPILLARY: 208 mg/dL — AB (ref 70–99)
GLUCOSE-CAPILLARY: 229 mg/dL — AB (ref 70–99)
GLUCOSE-CAPILLARY: 266 mg/dL — AB (ref 70–99)
GLUCOSE-CAPILLARY: 200 mg/dL — AB (ref 70–99)
Glucose-Capillary: 281 mg/dL — ABNORMAL HIGH (ref 70–99)

## 2014-04-26 LAB — CBC WITH DIFFERENTIAL/PLATELET
Basophils Absolute: 0 10*3/uL (ref 0.0–0.1)
Basophils Relative: 0 % (ref 0–1)
Eosinophils Absolute: 0.1 10*3/uL (ref 0.0–0.7)
Eosinophils Relative: 1 % (ref 0–5)
HEMATOCRIT: 30.9 % — AB (ref 36.0–46.0)
HEMOGLOBIN: 9.8 g/dL — AB (ref 12.0–15.0)
LYMPHS ABS: 1.4 10*3/uL (ref 0.7–4.0)
LYMPHS PCT: 18 % (ref 12–46)
MCH: 32.1 pg (ref 26.0–34.0)
MCHC: 31.7 g/dL (ref 30.0–36.0)
MCV: 101.3 fL — ABNORMAL HIGH (ref 78.0–100.0)
MONOS PCT: 10 % (ref 3–12)
Monocytes Absolute: 0.8 10*3/uL (ref 0.1–1.0)
NEUTROS ABS: 5.6 10*3/uL (ref 1.7–7.7)
Neutrophils Relative %: 71 % (ref 43–77)
Platelets: 241 10*3/uL (ref 150–400)
RBC: 3.05 MIL/uL — AB (ref 3.87–5.11)
RDW: 14.3 % (ref 11.5–15.5)
WBC: 7.9 10*3/uL (ref 4.0–10.5)

## 2014-04-26 LAB — BASIC METABOLIC PANEL
ANION GAP: 12 (ref 5–15)
BUN: 30 mg/dL — AB (ref 6–23)
CHLORIDE: 107 meq/L (ref 96–112)
CO2: 24 meq/L (ref 19–32)
Calcium: 8.9 mg/dL (ref 8.4–10.5)
Creatinine, Ser: 0.48 mg/dL — ABNORMAL LOW (ref 0.50–1.10)
GFR calc Af Amer: >90 mL/min (ref >90)
GFR calc non Af Amer: 89 mL/min — ABNORMAL LOW (ref >90)
Glucose, Bld: 244 mg/dL — ABNORMAL HIGH (ref 70–99)
POTASSIUM: 4.4 meq/L (ref 3.7–5.3)
Sodium: 143 mEq/L (ref 137–147)

## 2014-04-26 MED ORDER — LACTULOSE 10 GM/15ML PO SOLN
20.0000 g | Freq: Two times a day (BID) | ORAL | Status: DC
  Administered 2014-04-26 – 2014-04-30 (×7): 20 g via ORAL
  Filled 2014-04-26 (×12): qty 30

## 2014-04-26 MED ORDER — INSULIN ASPART 100 UNIT/ML ~~LOC~~ SOLN
0.0000 [IU] | SUBCUTANEOUS | Status: DC
  Administered 2014-04-26: 4 [IU] via SUBCUTANEOUS
  Administered 2014-04-27 (×2): 7 [IU] via SUBCUTANEOUS
  Administered 2014-04-27: 11 [IU] via SUBCUTANEOUS
  Administered 2014-04-27: 4 [IU] via SUBCUTANEOUS
  Administered 2014-04-27: 11 [IU] via SUBCUTANEOUS
  Administered 2014-04-27: 7 [IU] via SUBCUTANEOUS
  Administered 2014-04-28 (×2): 3 [IU] via SUBCUTANEOUS
  Administered 2014-04-28 (×3): 7 [IU] via SUBCUTANEOUS
  Administered 2014-04-28 – 2014-04-29 (×3): 4 [IU] via SUBCUTANEOUS
  Administered 2014-04-29 (×2): 3 [IU] via SUBCUTANEOUS
  Administered 2014-04-29 (×2): 4 [IU] via SUBCUTANEOUS
  Administered 2014-04-29 – 2014-04-30 (×2): 3 [IU] via SUBCUTANEOUS
  Administered 2014-04-30 (×2): 4 [IU] via SUBCUTANEOUS
  Administered 2014-04-30: 3 [IU] via SUBCUTANEOUS
  Administered 2014-04-30 (×2): 4 [IU] via SUBCUTANEOUS
  Administered 2014-04-30 – 2014-05-01 (×2): 3 [IU] via SUBCUTANEOUS
  Administered 2014-05-01: 4 [IU] via SUBCUTANEOUS

## 2014-04-26 MED ORDER — INSULIN GLARGINE 100 UNIT/ML ~~LOC~~ SOLN
20.0000 [IU] | Freq: Every day | SUBCUTANEOUS | Status: DC
  Administered 2014-04-26: 20 [IU] via SUBCUTANEOUS
  Filled 2014-04-26 (×2): qty 0.2

## 2014-04-26 NOTE — Progress Notes (Addendum)
PULMONARY / CRITICAL CARE MEDICINE   Name: Diane Stevenson MRN: [id number] DOB: 1930-03-06   LOS 8 days PCP Irven Shelling, MD   ADMISSION DATE:  04/16/2014 CONSULTATION DATE:  04/21/14  REFERRING MD :  Dr Dorian Pod of neuro and Dr Carson Puffer [facility]  CC:  Status epilepticus with risk for respiratory compromise  INITIAL PRESENTATION:  80 year old female who is cachectic and multiple medical problems including breast cancer 1997 and type 2 diabetes sustained subdural hematoma in the right frontal lobe FALCINE SDH after a fall on 04/16/2014. In addition she also sustained 3 fractures of T1, T6 and T10 above her kyphoplasty's at T11-T12 along with nondisplaced right 11th rib fracture. All this  was deemed nonoperative by neurosurgery and she was admitted to stepdown ICU by the tried hospitalist service. She had acute mental status change and CT head showed expansion of the subdural hematoma. Then on 04/20/2014 she developed in association wit chronic hyponatremia new onset seizures of clonic movements involving the left greater than the right leg and also the left upper extremity. EEG on 04/21/2014 showed status epilepticus despite medical management and patient was moved to the intensive care unit under the critical care medicine service for respiratory compromise, and her tracheal intubation and acute respiratory failure and controlling seizures with DIPRIVAN. PCCM will be primary from 04/21/14  SIGNIFICANT EVENTS: 04/16/2014 - admit for fall with acute subdural hematoma - non-surgical 04/21/14 - ICU transfer, PCCM primary and Intubation for diprivan gtt and continuous EEG 04/22/14 - off diprivan gtt 12/1 ct head>>> slight decrease SDH  SUBJECTIVE:  Unresponsive overall, some spont eye opening  VITAL SIGNS: Temp:  [98.4 F (36.9 C)-100.7 F (38.2 C)] 99.8 F (37.7 C) (11/30 0739) Pulse Rate:  [82-101] 96 (11/30 0748) Resp:  [10-18] 16 (11/30 0748) BP:  (128-181)/(57-74) 167/74 mmHg (11/30 0748) SpO2:  [96 %-100 %] 96 % (11/30 0752) FiO2 (%):  [30 %-40 %] 40 % (11/30 0752) Weight:  [148 lb 5.9 oz (67.3 kg)] 148 lb 5.9 oz (67.3 kg) (11/30 0400) HEMODYNAMICS:   VENTILATOR SETTINGS: Vent Mode:  [-] CPAP FiO2 (%):  [30 %-40 %] 40 % Set Rate:  [15 bmp] 15 bmp Vt Set:  [450 mL] 450 mL PEEP:  [5 cmH20] 5 cmH20 Pressure Support:  [12 cmH20] 12 cmH20 Plateau Pressure:  [14 cmH20-15 cmH20] 14 cmH20 INTAKE / OUTPUT:  Intake/Output Summary (Last 24 hours) at 04/24/14 0805 Last data filed at 04/24/14 0800  Gross per 24 hour  Intake   2798 ml  Output   1770 ml  Net   1028 ml    PHYSICAL EXAMINATION: General: Frail cachectic female comatose on vent Neuro: eyes open spont x 2, not sustained or to command HEENT:perr 2-3 mm Cardiovascular: S1 S2 RRR Lungs: CTA Abdomen: Soft, no r/g, BS wnl Musculoskeletal: increased pedal edema Skin:  Appears intact anteriorly  LABS: PULMONARY  Recent Labs Lab 04/21/14 2155 04/22/14 0958  PHART 7.464* 7.441  PCO2ART 33.2* 30.1*  PO2ART 202.0* 128.0*  HCO3 23.8 20.2  TCO2 25 21.1  O2SAT 100.0 99.1    CBC  Recent Labs Lab 04/21/14 2221 04/23/14 0211 04/24/14 0225  HGB 11.2* 10.4* 9.6*  HCT 32.8* 31.5* 29.3*  WBC 7.8 8.9 6.7  PLT 210 261 241    COAGULATION  Recent Labs Lab 04/23/14 0211  INR 1.24    CARDIAC    Recent Labs Lab 04/17/14 0810 04/17/14 1430  TROPONINI <0.30 <0.30    Recent Labs Lab 04/22/14  0010  PROBNP 727.4*     CHEMISTRY  Recent Labs Lab 04/17/14 1036  04/19/14 0321  04/20/14 0235 04/21/14 2221 04/22/14 0010 04/23/14 0211 04/24/14 0225  NA 135*  < > 128*  < > 130* 133* 134* 136* 141  K 4.0  < > 4.1  < > 4.2 4.0 3.8 4.0 4.1  CL 97  < > 91*  < > 93* 97 99 103 107  CO2 22  < > 22  < > 21 20 19  17* 20  GLUCOSE 155*  < > 196*  < > 212* 187* 192* 214* 226*  BUN 11  < > 13  < > 19 18 20  27* 26*  CREATININE 0.65  < > 0.62  < > 0.62 0.54 0.49*  0.58 0.50  CALCIUM 8.6  < > 8.8  < > 8.9 8.5 8.2* 8.4 8.2*  MG 1.1*  --  1.4*  --   --  1.7 1.7  --   --   PHOS 3.0  --   --   --   --  2.6 2.4  --   --   < > = values in this interval not displayed. Estimated Creatinine Clearance: 48.8 mL/min (by C-G formula based on Cr of 0.5).   LIVER  Recent Labs Lab 04/17/14 1036 04/19/14 0321 04/20/14 0235 04/23/14 0211  AST 20 15 17   --   ALT 19 17 15   --   ALKPHOS 55 61 60  --   BILITOT 0.6 0.6 0.5  --   PROT 6.5 6.9 6.9  --   ALBUMIN 3.4* 3.4* 3.2*  --   INR  --   --   --  1.24     INFECTIOUS  Recent Labs Lab 04/21/14 2144 04/22/14 0010  LATICACIDVEN 1.4 1.2     ENDOCRINE CBG (last 3)   Recent Labs  04/23/14 2330 04/24/14 0337 04/24/14 0738  GLUCAP 224* 204* 221*    IMAGING x48h Dg Chest Port 1 View  04/24/2014   CLINICAL DATA:  Acute respiratory failure  EXAM: PORTABLE CHEST - 1 VIEW  COMPARISON:  04/23/2014  FINDINGS: Interval advancement of endotracheal tube which is now 2.5 cm above the carina.  Decreased lung volume with increase in bibasilar atelectasis compared with the prior study. Negative for pneumonia or edema. Gastric tube in the stomach.  IMPRESSION: Endotracheal tube has been advanced with tip now 2.5 cm above the carina  Hypoventilation with increase in bibasilar atelectasis.   Electronically Signed   By: Franchot Gallo M.D.   On: 04/24/2014 07:23   Dg Chest Port 1 View  04/23/2014   CLINICAL DATA:  Acute respiratory failure  EXAM: PORTABLE CHEST - 1 VIEW  COMPARISON:  04/22/2014  FINDINGS: Cardiomediastinal silhouette is stable. NG tube with tip in distal stomach. Endotracheal tube with tip 4.5 cm above the carina. Mild left basilar atelectasis. No segmental infiltrate or pulmonary edema. Surgical clips are noted in left axilla. Stable chronic deformity of distal left clavicle. Prior vertebroplasty lower thoracic spine.  IMPRESSION: No segmental infiltrate or pulmonary edema. Stable support apparatus.    Electronically Signed   By: Lahoma Crocker M.D.   On: 04/23/2014 10:20   Dg Abd Portable 1v  04/22/2014   CLINICAL DATA:  Orogastric tube placement  EXAM: PORTABLE ABDOMEN - 1 VIEW  COMPARISON:  04/16/2014  FINDINGS: Orogastric tube has been placed and is located within the stomach.  IMPRESSION: Tube within the stomach.   Electronically Signed  By: Skipper Cliche M.D.   On: 04/22/2014 12:25    ASSESSMENT / PLAN:  NEUROLOGIC A:   Status epilepticus with Coma  SDH due to fall/head trauma 04/16/2014 P:   Management per Neuro - depicts a poor prognosis, have discussed eye opening Avoid any sedation further Anti-epileptics per neuro - Keppra, Vimpat, Thiamine Will need d/w family about consideration comfort care, will discuss with family 12.2 at 3 pm with husband and daughter  PULMONARY OETT 04/21/2014 Acute respiratory failure due to status epilepticus and acute encephalopathy P:   Wean attempt PS 5, cpap5 ,goal 2 hrs, assess rsbi and will update family Neuro status precludes extubation Will discuss with family consideration DNI upon planned extubation  CARDIOVASCULAR A:   Hx of HTN P:  Cardiac monitoring Labetalol 5 mg IV q rh prn Consider MAp lowering MAP goals   RENAL A Mild hyponatremia-->improved P:   bmet in am  euvolemic as goal No role lasix  GASTROINTESTINAL A: Cachexia with protein calorie malnutrition P:   TF per NG tube famotidine 20 mg BID colace, mirlax Dose lactulose x 1  HEMATOLOGY At risk for anemia of critical illness  P:  scd liit blood drwas, no cbc needed in am   INFECTIOUS A:   No evidence of infection  P:   pcxr reassuring, no repeat needed  Rocephin 11/23 >> 11/28  ENDOCRINE A:   DM, hyperglycemia P:   Ssi, maintain blood glucose < 180 lantus 10 Units QHS, increase to 20 Continue novolog SSI - 3 units Q 4 hours, may need increase to 4  TODAY'S SUMMARY: family meeting at 3 pm, no changes, considering comfort talks, wean,  limit blood draws  Ccm time 30 min  Lavon Paganini. Titus Mould, [place] Pgr: [phone] [facility]    Update: further evaluation shows followed int commands, canceling meeting, will update daughter .husband was in room Re eval in am   Lavon Paganini. Titus Mould, MD, [place] Pgr: [facility] '

## 2014-04-26 NOTE — Progress Notes (Signed)
Pt lantus dosage previously increased. Pt continuing to have CBGs in the high 200s. Elink MD notified and novolog scale changed from moderate to resistant per MD order.   Diane Stevenson

## 2014-04-26 NOTE — Progress Notes (Signed)
STROKE TEAM PROGRESS NOTE   SUBJECTIVE: Patient has been weaned off of the Propofol and no benzo in >4 days. No abnormal movements noted. Repeat head CT on 12/01 shows slight decrease in the prominence of the SDH. Per RN no overnight events  HISTORY JOSHUA ZERINGUE is an 80 y.o. female with a past medical history significant for HTN, DM type II, bilateral breast cancer, DJD, admitted to [facility] due to right hemispheric and right tentorium post traumatic SDH after a fall who has been experiencing fluctuating mental state changes and new onset seizures today. Patient follow by neurosurgery and no aggressive neurosurgical intervention recommended. Nursing staff reports off and on change in mental status, and recurrent but brief episodes of uncontrollable jerkiness involving the left leg greater than the right leg but with preservation of consciousness. Of note, she received some narcotics yesterday. Patient's fluctuation on mental status prompted a repeat CT brain yesterday which showed unchanged SDH in the locations described above. At this moment, patient is awake, follows simple commands, doesn't engage in conversation, and is having bouts of short lasting clonic movements involving the left greater than the right leg..  Serologies showed hyponatremia that is thought to be chronic.  The patient was not treated with TPA secondary to posttraumatic subdural hematoma.  Continuous EEG: 1. No further seizures noted  2. Right fronto-temporal spike wave discharges 3. Multi-regional spike-wave and sharp-wave discharges on the left (left frontocentral, left frontotemporal and posterior temporal-parietal, primarily) 4. Moderate diffuse slowing, polymorphic delta/theta  OBJECTIVE Temp:  [98 F (36.7 C)-100.5 F (38.1 C)] 99.2 F (37.3 C) (12/02 0800) Pulse Rate:  [72-91] 88 (12/02 0800) Cardiac Rhythm:  [-] Normal sinus rhythm (12/02 0800) Resp:  [14-23] 15 (12/02 0800) BP: (127-180)/(53-91) 172/75  mmHg (12/02 0800) SpO2:  [97 %-100 %] 100 % (12/02 0800) FiO2 (%):  [30 %] 30 % (12/02 0800) Weight:  [68 kg (149 lb 14.6 oz)] 68 kg (149 lb 14.6 oz) (12/02 0400)   Recent Labs Lab 04/25/14 1555 04/25/14 2010 04/25/14 2346 04/26/14 0352 04/26/14 0805  GLUCAP 220* 229* 230* 208* 229*    Recent Labs Lab 04/21/14 2221 04/22/14 0010 04/23/14 0211 04/24/14 0225 04/25/14 0216 04/26/14 0310  NA 133* 134* 136* 141 139 143  K 4.0 3.8 4.0 4.1 4.2 4.4  CL 97 99 103 107 104 107  CO2 20 19 17* 20 23 24   GLUCOSE 187* 192* 214* 226* 288* 244*  BUN 18 20 27* 26* 31* 30*  CREATININE 0.54 0.49* 0.58 0.50 0.52 0.48*  CALCIUM 8.5 8.2* 8.4 8.2* 8.7 8.9  MG 1.7 1.7  --   --   --   --   PHOS 2.6 2.4  --   --   --   --     Recent Labs Lab 04/20/14 0235 04/25/14 0216  AST 17 35  ALT 15 54*  ALKPHOS 60 128*  BILITOT 0.5 0.5  PROT 6.9 6.2  ALBUMIN 3.2* 2.3*    Recent Labs Lab 04/21/14 2221 04/23/14 0211 04/24/14 0225 04/25/14 0216 04/26/14 0310  WBC 7.8 8.9 6.7 7.9 7.9  NEUTROABS  --   --  5.0 5.9 5.6  HGB 11.2* 10.4* 9.6* 9.7* 9.8*  HCT 32.8* 31.5* 29.3* 30.3* 30.9*  MCV 98.2 97.8 99.0 100.3* 101.3*  PLT 210 261 241 261 241    Recent Labs Lab 04/21/14 2221 04/22/14 0010  CKTOTAL 27 25  CKMB <1.0 <1.0   No results for input(s): LABPROT, INR in the last 72  hours. No results for input(s): COLORURINE, LABSPEC, [place], GLUCOSEU, HGBUR, BILIRUBINUR, KETONESUR, PROTEINUR, UROBILINOGEN, NITRITE, LEUKOCYTESUR in the last 72 hours.  Invalid input(s): APPERANCEUR     Component Value Date/Time   CHOL 122 04/19/2014 0321   TRIG 100 04/21/2014 2144   HDL 38* 04/19/2014 0321   CHOLHDL 3.2 04/19/2014 0321   VLDL 16 04/19/2014 0321   LDLCALC 68 04/19/2014 0321   Lab Results  Component Value Date   HGBA1C 6.2* 04/21/2014   No results found for: LABOPIA, COCAINSCRNUR, LABBENZ, AMPHETMU, THCU, LABBARB  No results for input(s): ETH in the last 168 hours.     EEG 04/22/2014 This is an abnormal 24 hr video-EEG recording.  Significant findings include: 1. Recurrent focal seizures, primarily nonconvulsive, arising from from the right frontal lobe; none after midnight (04/22/2014) 2. Right frontal periodic epileptiform discharges (PLEDS) 3. Diffuse slowing on the left, low voltage polymorphic theta/delta 4. Suppression, right hemisphere   2-D echocardiogram  04/21/2014 Left ventricle: The cavity size was normal. Wall thickness was increased in a pattern of mild LVH. Systolic function was normal. The estimated ejection fraction was in the range of 60% to 65%. Wall motion was normal; there were no regional wall motion abnormalities. Doppler parameters are consistent with abnormal left ventricular relaxation (grade 1 diastolic dysfunction). The E/e&' ratio is between 8-15, suggesting indeterminate LV filling pressure. - Left atrium: The atrium was normal in size. - Tricuspid valve: There was mild regurgitation. - Pulmonary arteries: PA peak pressure: 36 mm Hg (S).  Impressions:  - LVEF 60-65%, mild LVH, normal wall motion, diastolic dysfunction, indeterminate LV filling pressure. mild TR, RVSP 36 mmHg.    Ct Head Wo Contrast 04/19/2014    1. Stable since 04/18/2014 parafalcine and right hemispheric subdural hematoma. Stable associated mass effect including partial effacement of the right lateral ventricle.  2. No new intracranial abnormality.      Mr Brain Wo Contrast 04/20/2014    1. Subdural hematoma re - identified in the right hemisphere and stable from recent CTs, including lobulated component along the interhemispheric fissure. Small component in the right posterior fossa.  2. Subtle signal abnormality in the posterior right cingulate gyrus adjacent to the parafalcine hematoma in this setting is suggestive of gyral edema due to recent seizure.  3. Superimposed small acute lacunar infarct in the left thalamus.  Possible second tiny lacune in the left occipital pole. No hemorrhage or mass effect associated with these.  4. Underlying chronic small vessel disease.       PHYSICAL EXAM Elderly Caucasian lady who is unresponsive, intubated. No eye opening. Not following commands. Head is nontraumatic. Neck is supple without bruit. Cardiac exam no murmur or gallop. Intubated.  Distal pulses are well felt. Neurological Exam : Not following commands. Pupils equal, sluggish. Conjugate gaze. Withdraws to painful stimuli LUE and  bilateral lowers L>R. Plantars mute.    ASSESSMENT/PLAN Ms. KHAYLA KOPPENHAVER is a 80 y.o. female with history of hypertension, diabetes mellitus, bilateral breast cancer, and DJD presenting with a posttraumatic subdural hematoma, status epilepticus. She did did not receive intravenous TPA secondary to hemorrhage.   Current active problem is encephalopathy of unclear etiology. Patient has known SDH, new infarcts on MRI and new onset seizure since admission. She has been off sedation for >72hrs but continues to be obtunded. Continuous EEG shows right fronto-temporal spike wave discharges and multiregional spike wave and sharp-wave discharges on the left. There is moderate diffuse slowing.   Repeat head CT shows slight  improvement. Patient has failed to show improvement in mental status even with being off all sedation for >72hours. Guarded prognosis at this time. Will need to discuss goals of care with family.     Jim Like, DO [facility] [phone]  If 7pm- 7am, please page neurology on call as listed in [place].    To contact Stroke Continuity provider, please refer to [email or web address] After hours, contact General Neurology

## 2014-04-26 NOTE — Progress Notes (Signed)
NUTRITION FOLLOW-UP  INTERVENTION: Increase Vital High protein to 55 ml/hr via OGT and  Tube feeding regimen provides 1320 kcal (97% of needs), 115 grams of protein, and 1103 ml of H2O.    NUTRITION DIAGNOSIS: Inadequate oral intake related to inability to eat as evidenced by NPO/Vent status; ongoing.   Goal: Pt to meet >/= 90% of their estimated nutrition needs; met.    Monitor:  TF tolerance, vent status, weight trend, labs   ASSESSMENT: 80 year old female who is cachectic and multiple medical problems including breast cancer 1997 and type 2 diabetes sustained subdural hematoma in the right frontal lobe FALCINE SDH after a fall on 04/16/2014. In addition she also sustained 3 fractures of T1, T6 and T10 above her kyphoplasty's at T11-T12 along with nondisplaced right 11th rib fracture. She was being medically managed on the floor then and all along she had some intermittent confusion on the 2015 she had acute mental status change and CT head showed expansion of the subdural hematoma.  status epilepticus due to [facility].   Labs:  CBG's 208-244  Patient is currently intubated on ventilator support MV: 6.6 L/min Temp (24hrs), Avg:99.6 F (37.6 C), Min:98 F (36.7 C), Max:100.5 F (38.1 C)  Propofol: off   Height: Ht Readings from Last 1 Encounters:  04/21/14 $RemoveB'5\' 5"'ELYSjDGh$  (1.651 m)    Weight: Wt Readings from Last 1 Encounters:  04/26/14 149 lb 14.6 oz (68 kg)  Admission weight 145 lb (66.2 kg)  BMI:  Body mass index is 24.95 kg/(m^2).  Estimated Nutritional Needs: Kcal: 1355 Protein: 90-105 grams Fluid: 1.7-1.9 L/day  Skin: intact  Diet Order: Diet NPO time specified   Intake/Output Summary (Last 24 hours) at 04/26/14 1125 Last data filed at 04/26/14 0950  Gross per 24 hour  Intake   1645 ml  Output   1245 ml  Net    400 ml    Last BM: 11/26 - pt on miralax and colace   Labs:   Recent Labs Lab 04/21/14 2221 04/22/14 0010  04/24/14 0225 04/25/14 0216  04/26/14 0310  NA 133* 134*  < > 141 139 143  K 4.0 3.8  < > 4.1 4.2 4.4  CL 97 99  < > 107 104 107  CO2 20 19  < > $R'20 23 24  'jZ$ BUN 18 20  < > 26* 31* 30*  CREATININE 0.54 0.49*  < > 0.50 0.52 0.48*  CALCIUM 8.5 8.2*  < > 8.2* 8.7 8.9  MG 1.7 1.7  --   --   --   --   PHOS 2.6 2.4  --   --   --   --   GLUCOSE 187* 192*  < > 226* 288* 244*  < > = values in this interval not displayed.  CBG (last 3)   Recent Labs  04/25/14 2346 04/26/14 0352 04/26/14 0805  GLUCAP 230* 208* 229*    Scheduled Meds: . antiseptic oral rinse  7 mL Mouth Rinse QID  . chlorhexidine  15 mL Mouth Rinse BID  . docusate sodium  100 mg Oral BID  . famotidine  20 mg Per Tube BID  . feeding supplement (VITAL HIGH PROTEIN)  1,000 mL Per Tube Q24H  . folic acid  1 mg Intravenous Daily  . insulin aspart  0-15 Units Subcutaneous 6 times per day  . insulin glargine  20 Units Subcutaneous Daily  . labetalol  10 mg Intravenous Q4H  . lactulose  20 g Oral BID  .  levETIRAcetam  1,000 mg Intravenous Q12H  . polyethylene glycol  17 g Oral Daily  . sodium chloride  3 mL Intravenous Q12H  . thiamine IV  100 mg Intravenous Daily    Continuous Infusions: . sodium chloride 10 mL/hr at 04/26/14 0900   [place], Pump Back, CNSC [phone] Pager [phone] After Hours Pager

## 2014-04-26 NOTE — Progress Notes (Signed)
Inpatient Diabetes Program Recommendations  AACE/ADA: New Consensus Statement on Inpatient Glycemic Control (2013)  Target Ranges:  Prepandial:   less than 140 mg/dL      Peak postprandial:   less than 180 mg/dL (1-2 hours)      Critically ill patients:  140 - 180 mg/dL    Results for ISOBELLE, TUCKETT (MRN [id number]) as of 04/26/2014 11:25  Ref. Range 04/25/2014 00:04 04/25/2014 03:24 04/25/2014 07:50 04/25/2014 11:31 04/25/2014 15:55 04/25/2014 20:10  Glucose-Capillary Latest Range: 70-99 mg/dL 256 (H) 266 (H) 228 (H) 245 (H) 220 (H) 229 (H)    Results for JANEA, SCHWENN (MRN [id number]) as of 04/26/2014 11:25  Ref. Range 04/25/2014 23:46 04/26/2014 03:52 04/26/2014 08:05  Glucose-Capillary Latest Range: 70-99 mg/dL 230 (H) 208 (H) 229 (H)    Current Insulin Orders: Novolog Moderate SSI Q4 hours  Lantus 20 units daily (increased today)   Patient having elevated glucose levels. Getting Vital High Protein Tube feeds at 50 cc/hour.  Patient received a total of 36 units Novolog SSI coverage yesterday (11/30).    MD- Please consider adding Novolog tube feed coverage- Novolog 3 units Q4 hours (hold if tube feeds held for any reason)  (Currently only has orders for Lantus and Novolog SSI Q4 hours)  If within goals of care for this patient.   Will follow Wyn Quaker RN, MSN, CDE Diabetes Coordinator Inpatient Diabetes Program Team Pager: [phone] (8a-10p)

## 2014-04-27 LAB — GLUCOSE, CAPILLARY
GLUCOSE-CAPILLARY: 242 mg/dL — AB (ref 70–99)
GLUCOSE-CAPILLARY: 243 mg/dL — AB (ref 70–99)
GLUCOSE-CAPILLARY: 251 mg/dL — AB (ref 70–99)
Glucose-Capillary: 261 mg/dL — ABNORMAL HIGH (ref 70–99)
Glucose-Capillary: 180 mg/dL — ABNORMAL HIGH (ref 70–99)
Glucose-Capillary: 213 mg/dL — ABNORMAL HIGH (ref 70–99)

## 2014-04-27 MED ORDER — INSULIN GLARGINE 100 UNIT/ML ~~LOC~~ SOLN
25.0000 [IU] | Freq: Every day | SUBCUTANEOUS | Status: DC
  Administered 2014-04-27: 25 [IU] via SUBCUTANEOUS
  Filled 2014-04-27 (×2): qty 0.25

## 2014-04-27 NOTE — Progress Notes (Signed)
Orthopedic Tech Progress Note Patient Details:  Diane Stevenson 06/30/1929 [id number]  Patient ID: [name], female   DOB: [birth year], 80 y.o.   MRN: [id number] Called in bio-tech brace order; spoke wih [name], [name] 04/27/2014, 10:32 AM

## 2014-04-27 NOTE — Progress Notes (Signed)
PULMONARY / CRITICAL CARE MEDICINE   Name: Diane Stevenson MRN: [id number] DOB: 1929/10/15   LOS 8 days PCP Irven Shelling, MD   ADMISSION DATE:  04/16/2014 CONSULTATION DATE:  04/21/14  REFERRING MD :  Dr Dorian Pod of neuro and Dr Kelli Puffer [facility]  CC:  Status epilepticus with risk for respiratory compromise  INITIAL PRESENTATION:  80 year old female who is cachectic and multiple medical problems including breast cancer 1997 and type 2 diabetes sustained subdural hematoma in the right frontal lobe FALCINE SDH after a fall on 04/16/2014. In addition she also sustained 3 fractures of T1, T6 and T10 above her kyphoplasty's at T11-T12 along with nondisplaced right 11th rib fracture. All this  was deemed nonoperative by neurosurgery and she was admitted to stepdown ICU by the tried hospitalist service. She had acute mental status change and CT head showed expansion of the subdural hematoma. Then on 04/20/2014 she developed in association wit chronic hyponatremia new onset seizures of clonic movements involving the left greater than the right leg and also the left upper extremity. EEG on 04/21/2014 showed status epilepticus despite medical management and patient was moved to the intensive care unit under the critical care medicine service for respiratory compromise, and her tracheal intubation and acute respiratory failure and controlling seizures with DIPRIVAN. PCCM will be primary from 04/21/14  SIGNIFICANT EVENTS: 04/16/2014 - admit for fall with acute subdural hematoma - non-surgical 04/21/14 - ICU transfer, PCCM primary and Intubation for diprivan gtt and continuous EEG 04/22/14 - off diprivan gtt 12/1 ct head>>> slight decrease SDH  SUBJECTIVE: lack of improvement or following commands last 24 hrs  VITAL SIGNS: Temp:  [98.4 F (36.9 C)-100.7 F (38.2 C)] 99.8 F (37.7 C) (11/30 0739) Pulse Rate:  [82-101] 96 (11/30 0748) Resp:  [10-18] 16 (11/30 0748) BP:  (128-181)/(57-74) 167/74 mmHg (11/30 0748) SpO2:  [96 %-100 %] 96 % (11/30 0752) FiO2 (%):  [30 %-40 %] 40 % (11/30 0752) Weight:  [148 lb 5.9 oz (67.3 kg)] 148 lb 5.9 oz (67.3 kg) (11/30 0400) HEMODYNAMICS:   VENTILATOR SETTINGS: Vent Mode:  [-] CPAP FiO2 (%):  [30 %-40 %] 40 % Set Rate:  [15 bmp] 15 bmp Vt Set:  [450 mL] 450 mL PEEP:  [5 cmH20] 5 cmH20 Pressure Support:  [12 cmH20] 12 cmH20 Plateau Pressure:  [14 cmH20-15 cmH20] 14 cmH20 INTAKE / OUTPUT:  Intake/Output Summary (Last 24 hours) at 04/24/14 0805 Last data filed at 04/24/14 0800  Gross per 24 hour  Intake   2798 ml  Output   1770 ml  Net   1028 ml    PHYSICAL EXAMINATION: General: Frail cachectic female comatose on vent Neuro: did not open eyes spont nor follow commands, pupils unchnaged HEENT:perr 2-3 mm Cardiovascular: S1 S2 RRR Lungs: slight coarse Abdomen: Soft, no r/g, BS wnl Musculoskeletal: increased pedal edema Skin:  Appears intact anteriorly  LABS: PULMONARY  Recent Labs Lab 04/21/14 2155 04/22/14 0958  PHART 7.464* 7.441  PCO2ART 33.2* 30.1*  PO2ART 202.0* 128.0*  HCO3 23.8 20.2  TCO2 25 21.1  O2SAT 100.0 99.1    CBC  Recent Labs Lab 04/21/14 2221 04/23/14 0211 04/24/14 0225  HGB 11.2* 10.4* 9.6*  HCT 32.8* 31.5* 29.3*  WBC 7.8 8.9 6.7  PLT 210 261 241    COAGULATION  Recent Labs Lab 04/23/14 0211  INR 1.24    CARDIAC    Recent Labs Lab 04/17/14 0810 04/17/14 1430  TROPONINI <0.30 <0.30    Recent  Labs Lab 04/22/14 0010  PROBNP 727.4*     CHEMISTRY  Recent Labs Lab 04/17/14 1036  04/19/14 0321  04/20/14 0235 04/21/14 2221 04/22/14 0010 04/23/14 0211 04/24/14 0225  NA 135*  < > 128*  < > 130* 133* 134* 136* 141  K 4.0  < > 4.1  < > 4.2 4.0 3.8 4.0 4.1  CL 97  < > 91*  < > 93* 97 99 103 107  CO2 22  < > 22  < > 21 20 19  17* 20  GLUCOSE 155*  < > 196*  < > 212* 187* 192* 214* 226*  BUN 11  < > 13  < > 19 18 20  27* 26*  CREATININE 0.65  < >  0.62  < > 0.62 0.54 0.49* 0.58 0.50  CALCIUM 8.6  < > 8.8  < > 8.9 8.5 8.2* 8.4 8.2*  MG 1.1*  --  1.4*  --   --  1.7 1.7  --   --   PHOS 3.0  --   --   --   --  2.6 2.4  --   --   < > = values in this interval not displayed. Estimated Creatinine Clearance: 48.8 mL/min (by C-G formula based on Cr of 0.5).   LIVER  Recent Labs Lab 04/17/14 1036 04/19/14 0321 04/20/14 0235 04/23/14 0211  AST 20 15 17   --   ALT 19 17 15   --   ALKPHOS 55 61 60  --   BILITOT 0.6 0.6 0.5  --   PROT 6.5 6.9 6.9  --   ALBUMIN 3.4* 3.4* 3.2*  --   INR  --   --   --  1.24     INFECTIOUS  Recent Labs Lab 04/21/14 2144 04/22/14 0010  LATICACIDVEN 1.4 1.2     ENDOCRINE CBG (last 3)   Recent Labs  04/23/14 2330 04/24/14 0337 04/24/14 0738  GLUCAP 224* 204* 221*    IMAGING x48h Dg Chest Port 1 View  04/24/2014   CLINICAL DATA:  Acute respiratory failure  EXAM: PORTABLE CHEST - 1 VIEW  COMPARISON:  04/23/2014  FINDINGS: Interval advancement of endotracheal tube which is now 2.5 cm above the carina.  Decreased lung volume with increase in bibasilar atelectasis compared with the prior study. Negative for pneumonia or edema. Gastric tube in the stomach.  IMPRESSION: Endotracheal tube has been advanced with tip now 2.5 cm above the carina  Hypoventilation with increase in bibasilar atelectasis.   Electronically Signed   By: Franchot Gallo M.D.   On: 04/24/2014 07:23   Dg Chest Port 1 View  04/23/2014   CLINICAL DATA:  Acute respiratory failure  EXAM: PORTABLE CHEST - 1 VIEW  COMPARISON:  04/22/2014  FINDINGS: Cardiomediastinal silhouette is stable. NG tube with tip in distal stomach. Endotracheal tube with tip 4.5 cm above the carina. Mild left basilar atelectasis. No segmental infiltrate or pulmonary edema. Surgical clips are noted in left axilla. Stable chronic deformity of distal left clavicle. Prior vertebroplasty lower thoracic spine.  IMPRESSION: No segmental infiltrate or pulmonary edema.  Stable support apparatus.   Electronically Signed   By: Lahoma Crocker M.D.   On: 04/23/2014 10:20   Dg Abd Portable 1v  04/22/2014   CLINICAL DATA:  Orogastric tube placement  EXAM: PORTABLE ABDOMEN - 1 VIEW  COMPARISON:  04/16/2014  FINDINGS: Orogastric tube has been placed and is located within the stomach.  IMPRESSION: Tube within the stomach.   Electronically  Signed   By: Skipper Cliche M.D.   On: 04/22/2014 12:25    ASSESSMENT / PLAN:  NEUROLOGIC A:   Status epilepticus with Coma  SDH due to fall/head trauma 04/16/2014 P:   Anti-epileptics per neuro - Keppra, Vimpat, Thiamine Will need d/w family about consideration comfort care, Friday as less responsive today  PULMONARY OETT 04/21/2014 Acute respiratory failure due to status epilepticus and acute encephalopathy P:   Wean attempt PS 10 cpap 5, goal 4 hrs upright position Will discuss with family consideration DNI upon planned extubation for Friday No new pcxr required Follow secretion status  CARDIOVASCULAR A:   Hx of HTN P:  Labetalol 5 mg IV q rh prn Consider MAp lowering MAP goals, will d/w neuro Currently at sys 160 acceptable  RENAL A Mild hyponatremia-->improved P:   bmet in am  euvolemic as goal kvo  GASTROINTESTINAL A: Cachexia with protein calorie malnutrition P:   TF per NG tube famotidine 20 mg BID colace, mirlax daily May need enema  HEMATOLOGY At risk for anemia of critical illness  P:  scd liit blood drwas  INFECTIOUS A:   No evidence of infection  P:   Follow fever curve Rocephin 11/23 >> 11/28  ENDOCRINE A:   DM, hyperglycemia P:   Ssi, maintain blood glucose < 180 lantus 10 Units QHS, increase to 25 Continue novolog ssi Add T fcoverage 3 units  TODAY'S SUMMARY: need fam meeting Friday as stagnant today, wean exercise, increase glu control  Ccm time 30 min  Husband updated in room 12/3  Lavon Paganini. Titus Mould, MD, [place] Pgr: [facility] '

## 2014-04-27 NOTE — Progress Notes (Signed)
Orthopedic Tech Progress Note Patient Details:  Diane Stevenson 03/12/1930 [id number]  Patient ID: [name], female   DOB: Sep 23, 1929, 80 y.o.   MRN: [id number] Brace order completed by Warnell Forester, Jalynne Persico 04/27/2014, 12:04 PM

## 2014-04-27 NOTE — Progress Notes (Signed)
UR completed.  Ray Gervasi, RN BSN MHA CCM Trauma/Neuro ICU Case Manager [phone]  

## 2014-04-27 NOTE — Progress Notes (Signed)
STROKE TEAM PROGRESS NOTE   SUBJECTIVE: Patient has been weaned off of the Propofol and no benzo in >4 days. Stopped vimpat on 12/02. No improvement in mental status.    HISTORY Diane Stevenson is an 80 y.o. female with a past medical history significant for HTN, DM type II, bilateral breast cancer, DJD, admitted to [facility] due to right hemispheric and right tentorium post traumatic SDH after a fall who has been experiencing fluctuating mental state changes and new onset seizures today. Patient follow by neurosurgery and no aggressive neurosurgical intervention recommended. Nursing staff reports off and on change in mental status, and recurrent but brief episodes of uncontrollable jerkiness involving the left leg greater than the right leg but with preservation of consciousness. Of note, she received some narcotics yesterday. Patient's fluctuation on mental status prompted a repeat CT brain yesterday which showed unchanged SDH in the locations described above. At this moment, patient is awake, follows simple commands, doesn't engage in conversation, and is having bouts of short lasting clonic movements involving the left greater than the right leg..  Serologies showed hyponatremia that is thought to be chronic.  The patient was not treated with TPA secondary to posttraumatic subdural hematoma.  Continuous EEG: 1. No further seizures noted  2. Right fronto-temporal spike wave discharges 3. Multi-regional spike-wave and sharp-wave discharges on the left (left frontocentral, left frontotemporal and posterior temporal-parietal, primarily) 4. Moderate diffuse slowing, polymorphic delta/theta  OBJECTIVE Temp:  [98.9 F (37.2 C)-100.8 F (38.2 C)] 100.8 F (38.2 C) (12/03 0742) Pulse Rate:  [78-97] 94 (12/03 0742) Cardiac Rhythm:  [-] Normal sinus rhythm (12/03 0400) Resp:  [15-19] 17 (12/03 0742) BP: (125-170)/(54-73) 150/67 mmHg (12/03 0742) SpO2:  [98 %-100 %] 99 % (12/03 0742) FiO2 (%):   [30 %] 30 % (12/03 0742) Weight:  [67.5 kg (148 lb 13 oz)] 67.5 kg (148 lb 13 oz) (12/03 0455)   Recent Labs Lab 04/26/14 1545 04/26/14 2001 04/27/14 0029 04/27/14 0413 04/27/14 0804  GLUCAP 281* 200* 243* 251* 242*    Recent Labs Lab 04/21/14 2221 04/22/14 0010 04/23/14 0211 04/24/14 0225 04/25/14 0216 04/26/14 0310  NA 133* 134* 136* 141 139 143  K 4.0 3.8 4.0 4.1 4.2 4.4  CL 97 99 103 107 104 107  CO2 20 19 17* 20 23 24   GLUCOSE 187* 192* 214* 226* 288* 244*  BUN 18 20 27* 26* 31* 30*  CREATININE 0.54 0.49* 0.58 0.50 0.52 0.48*  CALCIUM 8.5 8.2* 8.4 8.2* 8.7 8.9  MG 1.7 1.7  --   --   --   --   PHOS 2.6 2.4  --   --   --   --     Recent Labs Lab 04/25/14 0216  AST 35  ALT 54*  ALKPHOS 128*  BILITOT 0.5  PROT 6.2  ALBUMIN 2.3*    Recent Labs Lab 04/21/14 2221 04/23/14 0211 04/24/14 0225 04/25/14 0216 04/26/14 0310  WBC 7.8 8.9 6.7 7.9 7.9  NEUTROABS  --   --  5.0 5.9 5.6  HGB 11.2* 10.4* 9.6* 9.7* 9.8*  HCT 32.8* 31.5* 29.3* 30.3* 30.9*  MCV 98.2 97.8 99.0 100.3* 101.3*  PLT 210 261 241 261 241    Recent Labs Lab 04/21/14 2221 04/22/14 0010  CKTOTAL 27 25  CKMB <1.0 <1.0   No results for input(s): LABPROT, INR in the last 72 hours. No results for input(s): COLORURINE, LABSPEC, [place], GLUCOSEU, HGBUR, BILIRUBINUR, KETONESUR, PROTEINUR, UROBILINOGEN, NITRITE, LEUKOCYTESUR in the last 72  hours.  Invalid input(s): APPERANCEUR     Component Value Date/Time   CHOL 122 04/19/2014 0321   TRIG 100 04/21/2014 2144   HDL 38* 04/19/2014 0321   CHOLHDL 3.2 04/19/2014 0321   VLDL 16 04/19/2014 0321   LDLCALC 68 04/19/2014 0321   Lab Results  Component Value Date   HGBA1C 6.2* 04/21/2014   No results found for: LABOPIA, COCAINSCRNUR, LABBENZ, AMPHETMU, THCU, LABBARB  No results for input(s): ETH in the last 168 hours.    EEG 04/22/2014 This is an abnormal 24 hr video-EEG recording.  Significant findings include: 1. Recurrent focal  seizures, primarily nonconvulsive, arising from from the right frontal lobe; none after midnight (04/22/2014) 2. Right frontal periodic epileptiform discharges (PLEDS) 3. Diffuse slowing on the left, low voltage polymorphic theta/delta 4. Suppression, right hemisphere   2-D echocardiogram  04/21/2014 Left ventricle: The cavity size was normal. Wall thickness was increased in a pattern of mild LVH. Systolic function was normal. The estimated ejection fraction was in the range of 60% to 65%. Wall motion was normal; there were no regional wall motion abnormalities. Doppler parameters are consistent with abnormal left ventricular relaxation (grade 1 diastolic dysfunction). The E/e&' ratio is between 8-15, suggesting indeterminate LV filling pressure. - Left atrium: The atrium was normal in size. - Tricuspid valve: There was mild regurgitation. - Pulmonary arteries: PA peak pressure: 36 mm Hg (S).  Impressions:  - LVEF 60-65%, mild LVH, normal wall motion, diastolic dysfunction, indeterminate LV filling pressure. mild TR, RVSP 36 mmHg.    Ct Head Wo Contrast 04/19/2014    1. Stable since 04/18/2014 parafalcine and right hemispheric subdural hematoma. Stable associated mass effect including partial effacement of the right lateral ventricle.  2. No new intracranial abnormality.      Mr Brain Wo Contrast 04/20/2014    1. Subdural hematoma re - identified in the right hemisphere and stable from recent CTs, including lobulated component along the interhemispheric fissure. Small component in the right posterior fossa.  2. Subtle signal abnormality in the posterior right cingulate gyrus adjacent to the parafalcine hematoma in this setting is suggestive of gyral edema due to recent seizure.  3. Superimposed small acute lacunar infarct in the left thalamus. Possible second tiny lacune in the left occipital pole. No hemorrhage or mass effect associated with these.  4.  Underlying chronic small vessel disease.       PHYSICAL EXAM Elderly Caucasian lady who is unresponsive, intubated. No eye opening. Not following commands. Head is nontraumatic. Neck is supple without bruit. Cardiac exam no murmur or gallop. Intubated.  Distal pulses are well felt. Neurological Exam : Not following commands. Pupils equal, sluggish. Conjugate gaze. Withdraws to painful stimuli LUE and  bilateral lowers L>R. Plantars mute.    ASSESSMENT/PLAN Diane Stevenson is a 80 y.o. female with history of hypertension, diabetes mellitus, bilateral breast cancer, and DJD presenting with a posttraumatic subdural hematoma, status epilepticus. She did did not receive intravenous TPA secondary to hemorrhage.   Current active problem is encephalopathy of unclear etiology. Patient has known SDH, new infarcts on MRI and new onset seizure since admission. She has been off sedation for >72hrs but continues to be obtunded. Continuous EEG shows right fronto-temporal spike wave discharges and multiregional spike wave and sharp-wave discharges on the left. There is moderate diffuse slowing.   Repeat head CT shows slight improvement. Patient has failed to show improvement in mental status even with being off all sedation for >5 days. Guarded  prognosis at this time. Will need to discuss goals of care with family.     Jim Like, DO [facility] [phone]  If 7pm- 7am, please page neurology on call as listed in [place].    To contact Stroke Continuity provider, please refer to [email or web address] After hours, contact General Neurology

## 2014-04-28 LAB — URINALYSIS, ROUTINE W REFLEX MICROSCOPIC
Bilirubin Urine: NEGATIVE
Glucose, UA: NEGATIVE mg/dL
HGB URINE DIPSTICK: NEGATIVE
Ketones, ur: NEGATIVE mg/dL
LEUKOCYTES UA: NEGATIVE
Nitrite: NEGATIVE
PROTEIN: NEGATIVE mg/dL
Specific Gravity, Urine: 1.028 (ref 1.005–1.030)
Urobilinogen, UA: 4 mg/dL — ABNORMAL HIGH (ref 0.0–1.0)
pH: 6 (ref 5.0–8.0)

## 2014-04-28 LAB — BASIC METABOLIC PANEL
Anion gap: 11 (ref 5–15)
BUN: 34 mg/dL — ABNORMAL HIGH (ref 6–23)
CO2: 26 mEq/L (ref 19–32)
CREATININE: 0.51 mg/dL (ref 0.50–1.10)
Calcium: 8.7 mg/dL (ref 8.4–10.5)
Chloride: 107 mEq/L (ref 96–112)
GFR calc Af Amer: >90 mL/min (ref >90)
GFR calc non Af Amer: 87 mL/min — ABNORMAL LOW (ref >90)
GLUCOSE: 215 mg/dL — AB (ref 70–99)
POTASSIUM: 4.1 meq/L (ref 3.7–5.3)
Sodium: 144 mEq/L (ref 137–147)

## 2014-04-28 LAB — GLUCOSE, CAPILLARY
GLUCOSE-CAPILLARY: 169 mg/dL — AB (ref 70–99)
Glucose-Capillary: 143 mg/dL — ABNORMAL HIGH (ref 70–99)
Glucose-Capillary: 171 mg/dL — ABNORMAL HIGH (ref 70–99)
Glucose-Capillary: 196 mg/dL — ABNORMAL HIGH (ref 70–99)
Glucose-Capillary: 205 mg/dL — ABNORMAL HIGH (ref 70–99)
Glucose-Capillary: 210 mg/dL — ABNORMAL HIGH (ref 70–99)
Glucose-Capillary: 241 mg/dL — ABNORMAL HIGH (ref 70–99)

## 2014-04-28 MED ORDER — INSULIN ASPART 100 UNIT/ML ~~LOC~~ SOLN
3.0000 [IU] | SUBCUTANEOUS | Status: DC
Start: 2014-04-28 — End: 2014-05-01
  Administered 2014-04-28 – 2014-05-01 (×17): 3 [IU] via SUBCUTANEOUS

## 2014-04-28 MED ORDER — INSULIN GLARGINE 100 UNIT/ML ~~LOC~~ SOLN
35.0000 [IU] | Freq: Every day | SUBCUTANEOUS | Status: DC
  Administered 2014-04-28 – 2014-04-30 (×3): 35 [IU] via SUBCUTANEOUS
  Filled 2014-04-28 (×4): qty 0.35

## 2014-04-28 NOTE — Progress Notes (Signed)
PULMONARY / CRITICAL CARE MEDICINE   Name: GLENDON FISER MRN: [id number] DOB: 1929/08/14   LOS 8 days PCP Irven Shelling, MD   ADMISSION DATE:  04/16/2014 CONSULTATION DATE:  04/21/14  REFERRING MD :  Dr Dorian Pod of neuro and Dr Daniyla Puffer Of [facility]  CC:  Status epilepticus with risk for respiratory compromise  INITIAL PRESENTATION:  80 year old female who is cachectic and multiple medical problems including breast cancer 1997 and type 2 diabetes sustained subdural hematoma in the right frontal lobe FALCINE SDH after a fall on 04/16/2014. In addition she also sustained 3 fractures of T1, T6 and T10 above her kyphoplasty's at T11-T12 along with nondisplaced right 11th rib fracture. All this  was deemed nonoperative by neurosurgery and she was admitted to stepdown ICU by the tried hospitalist service. She had acute mental status change and CT head showed expansion of the subdural hematoma. Then on 04/20/2014 she developed in association wit chronic hyponatremia new onset seizures of clonic movements involving the left greater than the right leg and also the left upper extremity. EEG on 04/21/2014 showed status epilepticus despite medical management and patient was moved to the intensive care unit under the critical care medicine service for respiratory compromise, and her tracheal intubation and acute respiratory failure and controlling seizures with DIPRIVAN. PCCM will be primary from 04/21/14  SIGNIFICANT EVENTS: 04/16/2014 - admit for fall with acute subdural hematoma - non-surgical 04/21/14 - ICU transfer, PCCM primary and Intubation for diprivan gtt and continuous EEG 04/22/14 - off diprivan gtt 12/1 ct head>>> slight decrease SDH  SUBJECTIVE: lack of improvement or following commands last 48 hrs  VITAL SIGNS: Temp:  [98.4 F (36.9 C)-100.7 F (38.2 C)] 99.8 F (37.7 C) (11/30 0739) Pulse Rate:  [82-101] 96 (11/30 0748) Resp:  [10-18] 16 (11/30 0748) BP:  (128-181)/(57-74) 167/74 mmHg (11/30 0748) SpO2:  [96 %-100 %] 96 % (11/30 0752) FiO2 (%):  [30 %-40 %] 40 % (11/30 0752) Weight:  [148 lb 5.9 oz (67.3 kg)] 148 lb 5.9 oz (67.3 kg) (11/30 0400) HEMODYNAMICS:   VENTILATOR SETTINGS: Vent Mode:  [-] CPAP FiO2 (%):  [30 %-40 %] 40 % Set Rate:  [15 bmp] 15 bmp Vt Set:  [450 mL] 450 mL PEEP:  [5 cmH20] 5 cmH20 Pressure Support:  [12 cmH20] 12 cmH20 Plateau Pressure:  [14 cmH20-15 cmH20] 14 cmH20 INTAKE / OUTPUT:  Intake/Output Summary (Last 24 hours) at 04/24/14 0805 Last data filed at 04/24/14 0800  Gross per 24 hour  Intake   2798 ml  Output   1770 ml  Net   1028 ml    PHYSICAL EXAMINATION: General: Frail cachectic female  Neuro: did not open eyes spont nor follow commands, pupils unchanged, WD to distal pain HEENT:perr 2-3 mm Cardiovascular: S1 S2 RRR Lungs: CTA Abdomen: Soft, no r/g, BS wnl Musculoskeletal: increased pedal edema Skin:  wnl  LABS: PULMONARY  Recent Labs Lab 04/21/14 2155 04/22/14 0958  PHART 7.464* 7.441  PCO2ART 33.2* 30.1*  PO2ART 202.0* 128.0*  HCO3 23.8 20.2  TCO2 25 21.1  O2SAT 100.0 99.1    CBC  Recent Labs Lab 04/21/14 2221 04/23/14 0211 04/24/14 0225  HGB 11.2* 10.4* 9.6*  HCT 32.8* 31.5* 29.3*  WBC 7.8 8.9 6.7  PLT 210 261 241    COAGULATION  Recent Labs Lab 04/23/14 0211  INR 1.24    CARDIAC    Recent Labs Lab 04/17/14 0810 04/17/14 1430  TROPONINI <0.30 <0.30    Recent Labs  Lab 04/22/14 0010  PROBNP 727.4*     CHEMISTRY  Recent Labs Lab 04/17/14 1036  04/19/14 0321  04/20/14 0235 04/21/14 2221 04/22/14 0010 04/23/14 0211 04/24/14 0225  NA 135*  < > 128*  < > 130* 133* 134* 136* 141  K 4.0  < > 4.1  < > 4.2 4.0 3.8 4.0 4.1  CL 97  < > 91*  < > 93* 97 99 103 107  CO2 22  < > 22  < > 21 20 19  17* 20  GLUCOSE 155*  < > 196*  < > 212* 187* 192* 214* 226*  BUN 11  < > 13  < > 19 18 20  27* 26*  CREATININE 0.65  < > 0.62  < > 0.62 0.54 0.49* 0.58  0.50  CALCIUM 8.6  < > 8.8  < > 8.9 8.5 8.2* 8.4 8.2*  MG 1.1*  --  1.4*  --   --  1.7 1.7  --   --   PHOS 3.0  --   --   --   --  2.6 2.4  --   --   < > = values in this interval not displayed. Estimated Creatinine Clearance: 48.8 mL/min (by C-G formula based on Cr of 0.5).   LIVER  Recent Labs Lab 04/17/14 1036 04/19/14 0321 04/20/14 0235 04/23/14 0211  AST 20 15 17   --   ALT 19 17 15   --   ALKPHOS 55 61 60  --   BILITOT 0.6 0.6 0.5  --   PROT 6.5 6.9 6.9  --   ALBUMIN 3.4* 3.4* 3.2*  --   INR  --   --   --  1.24     INFECTIOUS  Recent Labs Lab 04/21/14 2144 04/22/14 0010  LATICACIDVEN 1.4 1.2     ENDOCRINE CBG (last 3)   Recent Labs  04/23/14 2330 04/24/14 0337 04/24/14 0738  GLUCAP 224* 204* 221*    IMAGING x48h Dg Chest Port 1 View  04/24/2014   CLINICAL DATA:  Acute respiratory failure  EXAM: PORTABLE CHEST - 1 VIEW  COMPARISON:  04/23/2014  FINDINGS: Interval advancement of endotracheal tube which is now 2.5 cm above the carina.  Decreased lung volume with increase in bibasilar atelectasis compared with the prior study. Negative for pneumonia or edema. Gastric tube in the stomach.  IMPRESSION: Endotracheal tube has been advanced with tip now 2.5 cm above the carina  Hypoventilation with increase in bibasilar atelectasis.   Electronically Signed   By: Franchot Gallo M.D.   On: 04/24/2014 07:23   Dg Chest Port 1 View  04/23/2014   CLINICAL DATA:  Acute respiratory failure  EXAM: PORTABLE CHEST - 1 VIEW  COMPARISON:  04/22/2014  FINDINGS: Cardiomediastinal silhouette is stable. NG tube with tip in distal stomach. Endotracheal tube with tip 4.5 cm above the carina. Mild left basilar atelectasis. No segmental infiltrate or pulmonary edema. Surgical clips are noted in left axilla. Stable chronic deformity of distal left clavicle. Prior vertebroplasty lower thoracic spine.  IMPRESSION: No segmental infiltrate or pulmonary edema. Stable support apparatus.    Electronically Signed   By: Lahoma Crocker M.D.   On: 04/23/2014 10:20   Dg Abd Portable 1v  04/22/2014   CLINICAL DATA:  Orogastric tube placement  EXAM: PORTABLE ABDOMEN - 1 VIEW  COMPARISON:  04/16/2014  FINDINGS: Orogastric tube has been placed and is located within the stomach.  IMPRESSION: Tube within the stomach.   Electronically Signed  By: Skipper Cliche M.D.   On: 04/22/2014 12:25    ASSESSMENT / PLAN:  NEUROLOGIC A:   Status epilepticus with Coma  SDH due to fall/head trauma 04/16/2014 P:   Anti-epileptics per neuro - Keppra, Vimpat, Thiamine Reductions of possible sedating anti seziure meds per neuro Will need d/w family about consideration comfort care, today at 130  PULMONARY OETT 04/21/2014 Acute respiratory failure due to status epilepticus and acute encephalopathy P:   Wean attempt PS 5 cpap 5, 2 hr, assess prediction model if extubated with dni, assess rsbi upright position Will discuss with family consideration DNI upon planned extubation today at 130 Follow secretion status and predict airway protection skills  CARDIOVASCULAR A:   Hx of HTN P:  Labetalol 5 mg IV q rh prn Not much needed, no need oral agent addition  RENAL A Mild hyponatremia-->improved Pos balance daily P:   kvo May need lasix  GASTROINTESTINAL A: Cachexia with protein calorie malnutrition P:   TF per NG tube famotidine 20 mg BID colace, mirlax daily - success 12/3  HEMATOLOGY At risk for anemia of critical illness  P:  scd  INFECTIOUS A:   No evidence of infection Temp 101.8 f x 1 overnight  P:   Rocephin 11/23 >> 11/28 UA, pcxr in am   ENDOCRINE A:   DM, hyperglycemia  P:   Ssi, maintain blood glucose < 180 lantus 10 Units QHS, increase to 35 Continue novolog ssi Add T fcoverage 3 units, have d/w RN  TODAY'S SUMMARY: fam meeting at 130, wean 5/5, consider dni with planned extubation, likely poor prognosis, control glu better  Ccm time 30 min  Husband  updated in room 12/4 see separate notes  Lavon Paganini. Titus Mould, MD, [place] Pgr: [facility] '

## 2014-04-28 NOTE — Progress Notes (Signed)
Patient ID: Diane Stevenson, female   DOB: 12-Jul-1929, 80 y.o.   MRN: [id number] Patient has had a no clinical improvement. Family has decided on DO NOT RESUSCITATE status. Surgical intervention is not a consideration regardless of those factors. I will sign off at this time.

## 2014-04-28 NOTE — Progress Notes (Signed)
STROKE TEAM PROGRESS NOTE   SUBJECTIVE: Patient has been weaned off of the Propofol and no benzo in >5 days. Stopped vimpat on 12/02. No improvement in mental status.  Per RN no overnight events.   HISTORY Diane Stevenson is an 80 y.o. female with a past medical history significant for HTN, DM type II, bilateral breast cancer, DJD, admitted to [facility] due to right hemispheric and right tentorium post traumatic SDH after a fall who has been experiencing fluctuating mental state changes and new onset seizures today. Patient follow by neurosurgery and no aggressive neurosurgical intervention recommended. Nursing staff reports off and on change in mental status, and recurrent but brief episodes of uncontrollable jerkiness involving the left leg greater than the right leg but with preservation of consciousness. Of note, she received some narcotics yesterday. Patient's fluctuation on mental status prompted a repeat CT brain yesterday which showed unchanged SDH in the locations described above. At this moment, patient is awake, follows simple commands, doesn't engage in conversation, and is having bouts of short lasting clonic movements involving the left greater than the right leg..  Serologies showed hyponatremia that is thought to be chronic.  The patient was not treated with TPA secondary to posttraumatic subdural hematoma.  Continuous EEG: 1. No further seizures noted  2. Right fronto-temporal spike wave discharges 3. Multi-regional spike-wave and sharp-wave discharges on the left (left frontocentral, left frontotemporal and posterior temporal-parietal, primarily) 4. Moderate diffuse slowing, polymorphic delta/theta  OBJECTIVE Temp:  [98.8 F (37.1 C)-101.8 F (38.8 C)] 101.8 F (38.8 C) (12/04 0400) Pulse Rate:  [79-96] 85 (12/04 0600) Cardiac Rhythm:  [-] Normal sinus rhythm (12/04 0400) Resp:  [15-24] 19 (12/04 0600) BP: (119-171)/(50-76) 152/59 mmHg (12/04 0600) SpO2:  [97 %-100 %] 99  % (12/04 0600) FiO2 (%):  [30 %] 30 % (12/04 0600) Weight:  [70.2 kg (154 lb 12.2 oz)] 70.2 kg (154 lb 12.2 oz) (12/04 0500)   Recent Labs Lab 04/27/14 1258 04/27/14 1536 04/27/14 1954 04/28/14 0019 04/28/14 0404  GLUCAP 261* 180* 213* 205* 210*    Recent Labs Lab 04/21/14 2221 04/22/14 0010 04/23/14 0211 04/24/14 0225 04/25/14 0216 04/26/14 0310 04/28/14 0228  NA 133* 134* 136* 141 139 143 144  K 4.0 3.8 4.0 4.1 4.2 4.4 4.1  CL 97 99 103 107 104 107 107  CO2 20 19 17* 20 23 24 26   GLUCOSE 187* 192* 214* 226* 288* 244* 215*  BUN 18 20 27* 26* 31* 30* 34*  CREATININE 0.54 0.49* 0.58 0.50 0.52 0.48* 0.51  CALCIUM 8.5 8.2* 8.4 8.2* 8.7 8.9 8.7  MG 1.7 1.7  --   --   --   --   --   PHOS 2.6 2.4  --   --   --   --   --     Recent Labs Lab 04/25/14 0216  AST 35  ALT 54*  ALKPHOS 128*  BILITOT 0.5  PROT 6.2  ALBUMIN 2.3*    Recent Labs Lab 04/21/14 2221 04/23/14 0211 04/24/14 0225 04/25/14 0216 04/26/14 0310  WBC 7.8 8.9 6.7 7.9 7.9  NEUTROABS  --   --  5.0 5.9 5.6  HGB 11.2* 10.4* 9.6* 9.7* 9.8*  HCT 32.8* 31.5* 29.3* 30.3* 30.9*  MCV 98.2 97.8 99.0 100.3* 101.3*  PLT 210 261 241 261 241    Recent Labs Lab 04/21/14 2221 04/22/14 0010  CKTOTAL 27 25  CKMB <1.0 <1.0   No results for input(s): LABPROT, INR in the last  72 hours. No results for input(s): COLORURINE, LABSPEC, [place], GLUCOSEU, HGBUR, BILIRUBINUR, KETONESUR, PROTEINUR, UROBILINOGEN, NITRITE, LEUKOCYTESUR in the last 72 hours.  Invalid input(s): APPERANCEUR     Component Value Date/Time   CHOL 122 04/19/2014 0321   TRIG 100 04/21/2014 2144   HDL 38* 04/19/2014 0321   CHOLHDL 3.2 04/19/2014 0321   VLDL 16 04/19/2014 0321   LDLCALC 68 04/19/2014 0321   Lab Results  Component Value Date   HGBA1C 6.2* 04/21/2014   No results found for: LABOPIA, COCAINSCRNUR, LABBENZ, AMPHETMU, THCU, LABBARB  No results for input(s): ETH in the last 168 hours.    EEG 04/22/2014 This is an  abnormal 24 hr video-EEG recording.  Significant findings include: 1. Recurrent focal seizures, primarily nonconvulsive, arising from from the right frontal lobe; none after midnight (04/22/2014) 2. Right frontal periodic epileptiform discharges (PLEDS) 3. Diffuse slowing on the left, low voltage polymorphic theta/delta 4. Suppression, right hemisphere   2-D echocardiogram  04/21/2014 Left ventricle: The cavity size was normal. Wall thickness was increased in a pattern of mild LVH. Systolic function was normal. The estimated ejection fraction was in the range of 60% to 65%. Wall motion was normal; there were no regional wall motion abnormalities. Doppler parameters are consistent with abnormal left ventricular relaxation (grade 1 diastolic dysfunction). The E/e&' ratio is between 8-15, suggesting indeterminate LV filling pressure. - Left atrium: The atrium was normal in size. - Tricuspid valve: There was mild regurgitation. - Pulmonary arteries: PA peak pressure: 36 mm Hg (S).  Impressions:  - LVEF 60-65%, mild LVH, normal wall motion, diastolic dysfunction, indeterminate LV filling pressure. mild TR, RVSP 36 mmHg.    Ct Head Wo Contrast 04/19/2014    1. Stable since 04/18/2014 parafalcine and right hemispheric subdural hematoma. Stable associated mass effect including partial effacement of the right lateral ventricle.  2. No new intracranial abnormality.      Mr Brain Wo Contrast 04/20/2014    1. Subdural hematoma re - identified in the right hemisphere and stable from recent CTs, including lobulated component along the interhemispheric fissure. Small component in the right posterior fossa.  2. Subtle signal abnormality in the posterior right cingulate gyrus adjacent to the parafalcine hematoma in this setting is suggestive of gyral edema due to recent seizure.  3. Superimposed small acute lacunar infarct in the left thalamus. Possible second tiny lacune in the  left occipital pole. No hemorrhage or mass effect associated with these.  4. Underlying chronic small vessel disease.       PHYSICAL EXAM Elderly Caucasian lady who is unresponsive, intubated. No eye opening. Not following commands. Head is nontraumatic. Neck is supple without bruit. Cardiac exam no murmur or gallop. Intubated.  Distal pulses are well felt. Neurological Exam : Not following commands. Pupils equal, sluggish. Conjugate gaze. Withdraws to painful stimuli LUE and  bilateral lowers L>R. Plantars mute.    ASSESSMENT/PLAN Diane Stevenson is a 80 y.o. female with history of hypertension, diabetes mellitus, bilateral breast cancer, and DJD presenting with a posttraumatic subdural hematoma, status epilepticus. She did did not receive intravenous TPA secondary to hemorrhage.   Current active problem is encephalopathy of unclear etiology. Patient has known SDH, new infarcts on MRI and new onset seizure since admission. She has been off sedation for >5 days but continues to be obtunded. Continuous EEG shows right fronto-temporal spike wave discharges and multiregional spike wave and sharp-wave discharges on the left. There is moderate diffuse slowing.   Repeat head CT  shows slight improvement. Patient has failed to show improvement in mental status even with being off all sedation for >5 days. Family meeting to discuss goals of care planned for today.     Jim Like, DO [facility] [phone]  If 7pm- 7am, please page neurology on call as listed in [place].    To contact Stroke Continuity provider, please refer to [email or web address] After hours, contact General Neurology

## 2014-04-29 ENCOUNTER — Inpatient Hospital Stay (HOSPITAL_COMMUNITY): Payer: Medicare Other

## 2014-04-29 DIAGNOSIS — E118 Type 2 diabetes mellitus with unspecified complications: Secondary | ICD-10-CM

## 2014-04-29 LAB — GLUCOSE, CAPILLARY
GLUCOSE-CAPILLARY: 194 mg/dL — AB (ref 70–99)
GLUCOSE-CAPILLARY: 179 mg/dL — AB (ref 70–99)
GLUCOSE-CAPILLARY: 147 mg/dL — AB (ref 70–99)
Glucose-Capillary: 168 mg/dL — ABNORMAL HIGH (ref 70–99)
Glucose-Capillary: 148 mg/dL — ABNORMAL HIGH (ref 70–99)

## 2014-04-29 LAB — BASIC METABOLIC PANEL
Anion gap: 12 (ref 5–15)
BUN: 32 mg/dL — AB (ref 6–23)
CHLORIDE: 107 meq/L (ref 96–112)
CO2: 24 meq/L (ref 19–32)
Calcium: 8.8 mg/dL (ref 8.4–10.5)
Creatinine, Ser: 0.5 mg/dL (ref 0.50–1.10)
GFR calc Af Amer: >90 mL/min (ref >90)
GFR calc non Af Amer: 88 mL/min — ABNORMAL LOW (ref >90)
Glucose, Bld: 174 mg/dL — ABNORMAL HIGH (ref 70–99)
POTASSIUM: 4.1 meq/L (ref 3.7–5.3)
Sodium: 143 mEq/L (ref 137–147)

## 2014-04-29 NOTE — Progress Notes (Signed)
PULMONARY / CRITICAL CARE MEDICINE   Name: Diane Stevenson MRN: [id number] DOB: Sep 17, 1929   LOS 8 days PCP Irven Shelling, MD   ADMISSION DATE:  04/16/2014 CONSULTATION DATE:  04/21/14  REFERRING MD :  Dr Dorian Pod of neuro and Dr Christin Puffer Of [facility]  CC:  Status epilepticus with risk for respiratory compromise  INITIAL PRESENTATION:  80 year old female who is cachectic and multiple medical problems including breast cancer 1997 and type 2 diabetes sustained subdural hematoma in the right frontal lobe FALCINE SDH after a fall on 04/16/2014. In addition she also sustained 3 fractures of T1, T6 and T10 above her kyphoplasty's at T11-T12 along with nondisplaced right 11th rib fracture. All this  was deemed nonoperative by neurosurgery and she was admitted to stepdown ICU by the tried hospitalist service. She had acute mental status change and CT head showed expansion of the subdural hematoma. Then on 04/20/2014 she developed in association wit chronic hyponatremia new onset seizures of clonic movements involving the left greater than the right leg and also the left upper extremity. EEG on 04/21/2014 showed status epilepticus despite medical management and patient was moved to the intensive care unit under the critical care medicine service for respiratory compromise, and her tracheal intubation and acute respiratory failure and controlling seizures with DIPRIVAN. PCCM will be primary from 04/21/14  SIGNIFICANT EVENTS: 04/16/2014 - admit for fall with acute subdural hematoma - non-surgical 04/21/14 - ICU transfer, PCCM primary and Intubation for diprivan gtt and continuous EEG 04/22/14 - off diprivan gtt 12/1 ct head>>> slight decrease SDH  SUBJECTIVE: no response to stimuli  VITAL SIGNS: BP 144/62 mmHg  Pulse 87  Temp(Src) 98.8 F (37.1 C) (Axillary)  Resp 15  Ht 5\' 5"  (1.651 m)  Wt 147 lb 0.8 oz (66.7 kg)  BMI 24.47 kg/m2  SpO2 99%  HEMODYNAMICS:   VENTILATOR  SETTINGS: Vent Mode:  [-] PRVC FiO2 (%):  [30 %] 30 % Set Rate:  [15 bmp] 15 bmp Vt Set:  [450 mL] 450 mL PEEP:  [5 cmH20] 5 cmH20 Pressure Support:  [5 cmH20] 5 cmH20 Plateau Pressure:  [13 cmH20-14 cmH20] 13 cmH20 INTAKE / OUTPUT:   Intake/Output Summary (Last 24 hours) at 04/29/14 0804 Last data filed at 04/29/14 0800  Gross per 24 hour  Intake   1573 ml  Output   1011 ml  Net    562 ml   PHYSICAL EXAMINATION: General: Frail cachectic female  Neuro:Does not open eyes spont nor follow commands, pupils unchanged,negatve doll's eyes HEENT:perr 2-3 mm Cardiovascular: S1 S2 RRR Lungs: CTA Abdomen: Soft, no r/g, BS wnl Musculoskeletal: increased pedal edema Skin:  wnl  LABS: PULMONARY  Recent Labs  Recent Labs Lab 04/26/14 0310 04/28/14 0228 04/29/14 0245  NA 143 144 143  K 4.4 4.1 4.1  CL 107 107 107  CO2 24 26 24   BUN 30* 34* 32*  CREATININE 0.48* 0.51 0.50  GLUCOSE 244* 215* 174*    Recent Labs Lab 04/24/14 0225 04/25/14 0216 04/26/14 0310  HGB 9.6* 9.7* 9.8*  HCT 29.3* 30.3* 30.9*  WBC 6.7 7.9 7.9  PLT 241 261 241     COAGULATION  Lab Results  Component Value Date   INR 1.24 04/23/2014   INR 1.16 04/17/2014   INR 1.05 08/19/2012      CARDIAC    Recent Labs Lab 04/17/14 0810 04/17/14 1430  TROPONINI <0.30 <0.30    Recent Labs Lab 04/22/14 0010  PROBNP 727.4*  ENDOCRINE CBG (last 3)   CBG (last 3)   Recent Labs  04/28/14 2341 04/29/14 0339 04/29/14 0736  GLUCAP 169* 168* 148*       IMAGING x48h No results found.   ASSESSMENT / PLAN:  NEUROLOGIC A:   Status epilepticus with Coma  SDH due to fall/head trauma 04/16/2014 P:   Anti-epileptics per neuro - Keppra, Vimpat, Thiamine Off sedation Now DNR  PULMONARY OETT 04/21/2014 Acute respiratory failure due to status epilepticus and acute encephalopathy P:   Weans but will not protect airway if extubated   CARDIOVASCULAR A:   Hx of HTN P:   Labetalol 5 mg IV q rh prn Not much needed, no need oral agent addition  RENAL  Recent Labs Lab 04/26/14 0310 04/28/14 0228 04/29/14 0245  NA 143 144 143    A Mild hyponatremia-->resolved Pos balance daily P:   kvo   GASTROINTESTINAL A: Cachexia with protein calorie malnutrition P:   TF per NG tube famotidine 20 mg BID colace, mirlax daily   HEMATOLOGY At risk for anemia of critical illness  P:  scd  INFECTIOUS A:   No evidence of infection   P:   Rocephin 11/23 >> 11/28 UA, pcxr in am   ENDOCRINE CBG (last 3)   Recent Labs  04/28/14 2341 04/29/14 0339 04/29/14 0736  GLUCAP 169* 168* 148*     A:   DM, hyperglycemia  P:   Ssi, maintain blood glucose < 180 lantus 10 Units QHS, increase to 35 Continue novolog ssi Add T fcoverage 3 units  TODAY'S SUMMARY: Now DNR   Husband & daughter updated in room 12/5  Richardson Landry Minor ACNP Maryanna Shape PCCM Pager [phone] till 3 pm If no answer page [phone]  ATTENDING NOTE: I have personally reviewed patient's available data, including medical history, events of note, physical examination and test results as part of my evaluation. I have discussed with resident/NP and other careteam providers such as pharmacist, RN and RRT & co-ordinated with consultants. In addition, I personally evaluated patient and elicited key history of SDH post fall, with status, now off sedation x 5days , exam findings of remains unresponsive , no response to DPS & labs showing hyperglycemia, improved Na.  New infarcts on MRI Await family decision on one way extubation here once we are all certain that no further neurological improvement expecetd Rest per NP/medical resident whose note is outlined above and that I agree with and edited in full.   The patient is critically ill with multiple organ systems failure and requires high complexity decision making for assessment and support, frequent evaluation and titration of therapies,  application of advanced monitoring technologies and extensive interpretation of multiple databases. Critical Care Time devoted to patient care services described in this note independent of APP time is 31 minutes.    Kara Mead MD. [place] Flood. [place] [facility] Pager [phone] If no response call [phone]   04/29/2014, 8:03 AM

## 2014-04-29 NOTE — Progress Notes (Signed)
STROKE TEAM PROGRESS NOTE   SUBJECTIVE: Patient has been weaned off of the Propofol and no benzo in >5 days. Stopped vimpat on 12/02. No improvement in mental status.  Per RN no overnight events.   Had family meeting evening of 12/04 with spouse and daughter. Explained poor prognosis of meaningful recovery at this point. They expressed understanding. PCCM, Dr Titus Mould, also discussed prognosis and plan of care with family. They expressed understanding. Will make patient DNR and consider their options at this time.   HISTORY Diane Stevenson is an 80 y.o. female with a past medical history significant for HTN, DM type II, bilateral breast cancer, DJD, admitted to [facility] due to right hemispheric and right tentorium post traumatic SDH after a fall who has been experiencing fluctuating mental state changes and new onset seizures today. Patient follow by neurosurgery and no aggressive neurosurgical intervention recommended. Nursing staff reports off and on change in mental status, and recurrent but brief episodes of uncontrollable jerkiness involving the left leg greater than the right leg but with preservation of consciousness. Of note, she received some narcotics yesterday. Patient's fluctuation on mental status prompted a repeat CT brain yesterday which showed unchanged SDH in the locations described above. At this moment, patient is awake, follows simple commands, doesn't engage in conversation, and is having bouts of short lasting clonic movements involving the left greater than the right leg..  Serologies showed hyponatremia that is thought to be chronic.  The patient was not treated with TPA secondary to posttraumatic subdural hematoma.  Continuous EEG: 1. No further seizures noted  2. Right fronto-temporal spike wave discharges 3. Multi-regional spike-wave and sharp-wave discharges on the left (left frontocentral, left frontotemporal and posterior temporal-parietal, primarily) 4. Moderate  diffuse slowing, polymorphic delta/theta  OBJECTIVE Temp:  [98.8 F (37.1 C)-100.6 F (38.1 C)] 98.8 F (37.1 C) (12/05 0400) Pulse Rate:  [81-103] 92 (12/05 0700) Cardiac Rhythm:  [-] Normal sinus rhythm (12/05 0000) Resp:  [15-28] 15 (12/05 0700) BP: (130-157)/(52-74) 137/63 mmHg (12/05 0700) SpO2:  [97 %-100 %] 99 % (12/05 0700) FiO2 (%):  [30 %] 30 % (12/05 0600) Weight:  [66.7 kg (147 lb 0.8 oz)] 66.7 kg (147 lb 0.8 oz) (12/05 0500)   Recent Labs Lab 04/28/14 1611 04/28/14 2003 04/28/14 2341 04/29/14 0339 04/29/14 0736  GLUCAP 171* 143* 169* 168* 148*    Recent Labs Lab 04/24/14 0225 04/25/14 0216 04/26/14 0310 04/28/14 0228 04/29/14 0245  NA 141 139 143 144 143  K 4.1 4.2 4.4 4.1 4.1  CL 107 104 107 107 107  CO2 20 23 24 26 24   GLUCOSE 226* 288* 244* 215* 174*  BUN 26* 31* 30* 34* 32*  CREATININE 0.50 0.52 0.48* 0.51 0.50  CALCIUM 8.2* 8.7 8.9 8.7 8.8    Recent Labs Lab 04/25/14 0216  AST 35  ALT 54*  ALKPHOS 128*  BILITOT 0.5  PROT 6.2  ALBUMIN 2.3*    Recent Labs Lab 04/23/14 0211 04/24/14 0225 04/25/14 0216 04/26/14 0310  WBC 8.9 6.7 7.9 7.9  NEUTROABS  --  5.0 5.9 5.6  HGB 10.4* 9.6* 9.7* 9.8*  HCT 31.5* 29.3* 30.3* 30.9*  MCV 97.8 99.0 100.3* 101.3*  PLT 261 241 261 241   No results for input(s): CKTOTAL, CKMB, CKMBINDEX, TROPONINI in the last 168 hours. No results for input(s): LABPROT, INR in the last 72 hours.  Recent Labs  04/28/14 1139  COLORURINE YELLOW  LABSPEC 1.028  PHURINE 6.0  GLUCOSEU NEGATIVE  HGBUR NEGATIVE  BILIRUBINUR NEGATIVE  KETONESUR NEGATIVE  PROTEINUR NEGATIVE  UROBILINOGEN 4.0*  NITRITE NEGATIVE  LEUKOCYTESUR NEGATIVE       Component Value Date/Time   CHOL 122 04/19/2014 0321   TRIG 100 04/21/2014 2144   HDL 38* 04/19/2014 0321   CHOLHDL 3.2 04/19/2014 0321   VLDL 16 04/19/2014 0321   LDLCALC 68 04/19/2014 0321   Lab Results  Component Value Date   HGBA1C 6.2* 04/21/2014   No results  found for: LABOPIA, COCAINSCRNUR, LABBENZ, AMPHETMU, THCU, LABBARB  No results for input(s): ETH in the last 168 hours.    EEG 04/22/2014 This is an abnormal 24 hr video-EEG recording.  Significant findings include: 1. Recurrent focal seizures, primarily nonconvulsive, arising from from the right frontal lobe; none after midnight (04/22/2014) 2. Right frontal periodic epileptiform discharges (PLEDS) 3. Diffuse slowing on the left, low voltage polymorphic theta/delta 4. Suppression, right hemisphere   2-D echocardiogram  04/21/2014 Left ventricle: The cavity size was normal. Wall thickness was increased in a pattern of mild LVH. Systolic function was normal. The estimated ejection fraction was in the range of 60% to 65%. Wall motion was normal; there were no regional wall motion abnormalities. Doppler parameters are consistent with abnormal left ventricular relaxation (grade 1 diastolic dysfunction). The E/e&' ratio is between 8-15, suggesting indeterminate LV filling pressure. - Left atrium: The atrium was normal in size. - Tricuspid valve: There was mild regurgitation. - Pulmonary arteries: PA peak pressure: 36 mm Hg (S).  Impressions:  - LVEF 60-65%, mild LVH, normal wall motion, diastolic dysfunction, indeterminate LV filling pressure. mild TR, RVSP 36 mmHg.    Ct Head Wo Contrast 04/19/2014    1. Stable since 04/18/2014 parafalcine and right hemispheric subdural hematoma. Stable associated mass effect including partial effacement of the right lateral ventricle.  2. No new intracranial abnormality.      Mr Brain Wo Contrast 04/20/2014    1. Subdural hematoma re - identified in the right hemisphere and stable from recent CTs, including lobulated component along the interhemispheric fissure. Small component in the right posterior fossa.  2. Subtle signal abnormality in the posterior right cingulate gyrus adjacent to the parafalcine hematoma in this setting  is suggestive of gyral edema due to recent seizure.  3. Superimposed small acute lacunar infarct in the left thalamus. Possible second tiny lacune in the left occipital pole. No hemorrhage or mass effect associated with these.  4. Underlying chronic small vessel disease.       PHYSICAL EXAM Elderly Caucasian lady who is unresponsive, intubated. No eye opening. Not following commands. Head is nontraumatic. Neck is supple without bruit. Cardiac exam no murmur or gallop. Intubated.  Distal pulses are well felt. Neurological Exam : Not following commands. Pupils equal, sluggish. Conjugate gaze. Minimally withdraws to painful stimuli LUE and  bilateral lowers L>R. Plantars mute.    ASSESSMENT/PLAN Ms. Diane Stevenson is a 80 y.o. female with history of hypertension, diabetes mellitus, bilateral breast cancer, and DJD presenting with a posttraumatic subdural hematoma, status epilepticus. She did did not receive intravenous TPA secondary to hemorrhage.   Current active problem is encephalopathy of unclear etiology. Patient has known SDH, new infarcts on MRI and new onset seizure since admission. She has been off sedation for >5 days but continues to be obtunded. Continuous EEG shows right fronto-temporal spike wave discharges and multiregional spike wave and sharp-wave discharges on the left. There is moderate diffuse slowing.   Repeat head CT shows slight improvement. Patient has failed  to show improvement in mental status even with being off all sedation for >5 days. Awaiting family decision on goals of care.      Jim Like, DO [facility] [phone]  If 7pm- 7am, please page neurology on call as listed in [place].    To contact Stroke Continuity provider, please refer to [email or web address] After hours, contact General Neurology

## 2014-04-30 LAB — GLUCOSE, CAPILLARY
GLUCOSE-CAPILLARY: 188 mg/dL — AB (ref 70–99)
Glucose-Capillary: 137 mg/dL — ABNORMAL HIGH (ref 70–99)
Glucose-Capillary: 138 mg/dL — ABNORMAL HIGH (ref 70–99)
Glucose-Capillary: 141 mg/dL — ABNORMAL HIGH (ref 70–99)
Glucose-Capillary: 156 mg/dL — ABNORMAL HIGH (ref 70–99)
Glucose-Capillary: 161 mg/dL — ABNORMAL HIGH (ref 70–99)
Glucose-Capillary: 166 mg/dL — ABNORMAL HIGH (ref 70–99)

## 2014-04-30 LAB — CBC
HEMATOCRIT: 30.3 % — AB (ref 36.0–46.0)
HEMOGLOBIN: 9.7 g/dL — AB (ref 12.0–15.0)
MCH: 33.3 pg (ref 26.0–34.0)
MCHC: 32 g/dL (ref 30.0–36.0)
MCV: 104.1 fL — ABNORMAL HIGH (ref 78.0–100.0)
Platelets: 326 10*3/uL (ref 150–400)
RBC: 2.91 MIL/uL — ABNORMAL LOW (ref 3.87–5.11)
RDW: 14.6 % (ref 11.5–15.5)
WBC: 10.6 10*3/uL — AB (ref 4.0–10.5)

## 2014-04-30 LAB — BASIC METABOLIC PANEL
Anion gap: 11 (ref 5–15)
BUN: 40 mg/dL — AB (ref 6–23)
CHLORIDE: 108 meq/L (ref 96–112)
CO2: 25 mEq/L (ref 19–32)
Calcium: 8.5 mg/dL (ref 8.4–10.5)
Creatinine, Ser: 0.58 mg/dL (ref 0.50–1.10)
GFR calc Af Amer: >90 mL/min (ref >90)
GFR calc non Af Amer: 84 mL/min — ABNORMAL LOW (ref >90)
GLUCOSE: 173 mg/dL — AB (ref 70–99)
POTASSIUM: 3.9 meq/L (ref 3.7–5.3)
SODIUM: 144 meq/L (ref 137–147)

## 2014-04-30 MED ORDER — DOCUSATE SODIUM 50 MG/5ML PO LIQD
100.0000 mg | Freq: Two times a day (BID) | ORAL | Status: DC
  Filled 2014-04-30 (×4): qty 10

## 2014-04-30 NOTE — Progress Notes (Signed)
PULMONARY / CRITICAL CARE MEDICINE   Name: Diane Stevenson MRN: [id number] DOB: 23-Jul-1929   LOS 8 days PCP Irven Shelling, MD   ADMISSION DATE:  04/16/2014 CONSULTATION DATE:  04/21/14  REFERRING MD :  Dr Dorian Pod of neuro and Dr Stephaie Puffer Of [facility]  CC:  Status epilepticus with risk for respiratory compromise  INITIAL PRESENTATION:  80 year old female who is cachectic and multiple medical problems including breast cancer 1997 and type 2 diabetes sustained subdural hematoma in the right frontal lobe FALCINE SDH after a fall on 04/16/2014. In addition she also sustained 3 fractures of T1, T6 and T10 above her kyphoplasty's at T11-T12 along with nondisplaced right 11th rib fracture. All this  was deemed nonoperative by neurosurgery and she was admitted to stepdown ICU by the tried hospitalist service. She had acute mental status change and CT head showed expansion of the subdural hematoma. Then on 04/20/2014 she developed in association wit chronic hyponatremia new onset seizures of clonic movements involving the left greater than the right leg and also the left upper extremity. EEG on 04/21/2014 showed status epilepticus despite medical management and patient was moved to the intensive care unit under the critical care medicine service for respiratory compromise, and her tracheal intubation and acute respiratory failure and controlling seizures with DIPRIVAN. PCCM will be primary from 04/21/14  SIGNIFICANT EVENTS: 04/16/2014 - admit for fall with acute subdural hematoma - non-surgical 04/21/14 - ICU transfer, PCCM primary and Intubation for diprivan gtt and continuous EEG 04/22/14 - off diprivan gtt 12/1 ct head>>> slight decrease SDH  SUBJECTIVE: no response to stimuli Low gr fever  VITAL SIGNS: BP 136/64 mmHg  Pulse 95  Temp(Src) 98.7 F (37.1 C) (Axillary)  Resp 15  Ht 5\' 5"  (1.651 m)  Wt 149 lb 7.6 oz (67.8 kg)  BMI 24.87 kg/m2  SpO2 98%  HEMODYNAMICS:    VENTILATOR SETTINGS: Vent Mode:  [-] PRVC FiO2 (%):  [30 %] 30 % Set Rate:  [15 bmp] 15 bmp Vt Set:  [450 mL] 450 mL PEEP:  [5 cmH20] 5 cmH20 Pressure Support:  [5 cmH20-8 cmH20] 8 cmH20 Plateau Pressure:  [13 cmH20-15 cmH20] 13 cmH20 INTAKE / OUTPUT:   Intake/Output Summary (Last 24 hours) at 04/30/14 0739 Last data filed at 04/30/14 0600  Gross per 24 hour  Intake   1680 ml  Output   1015 ml  Net    665 ml   PHYSICAL EXAMINATION: General: Frail cachectic female  Neuro:Does not open eyes spont nor follow commands, pupils unchanged,negatve doll's eyes HEENT:perr 2-3 mm Cardiovascular: S1 S2 RRR Lungs: CTA Abdomen: Soft, no r/g, BS wnl Musculoskeletal: increased pedal edema Skin:  wnl  LABS: PULMONARY  Recent Labs  Recent Labs Lab 04/28/14 0228 04/29/14 0245 04/30/14 0239  NA 144 143 144  K 4.1 4.1 3.9  CL 107 107 108  CO2 26 24 25   BUN 34* 32* 40*  CREATININE 0.51 0.50 0.58  GLUCOSE 215* 174* 173*    Recent Labs Lab 04/25/14 0216 04/26/14 0310 04/30/14 0239  HGB 9.7* 9.8* 9.7*  HCT 30.3* 30.9* 30.3*  WBC 7.9 7.9 10.6*  PLT 261 241 326     COAGULATION  Lab Results  Component Value Date   INR 1.24 04/23/2014   INR 1.16 04/17/2014   INR 1.05 08/19/2012      CARDIAC    Recent Labs Lab 04/17/14 0810 04/17/14 1430  TROPONINI <0.30 <0.30    Recent Labs Lab 04/22/14 0010  PROBNP  727.4*      ENDOCRINE CBG (last 3)   CBG (last 3)   Recent Labs  04/29/14 2034 04/29/14 2346 04/30/14 0405  GLUCAP 147* 188* 161*       IMAGING x48h Dg Chest Port 1 View  04/29/2014   CLINICAL DATA:  Respiratory acidosis  EXAM: PORTABLE CHEST - 1 VIEW  COMPARISON:  04/26/2014  FINDINGS: Endotracheal and NG tubes stable. Left basilar atelectasis improved. Mild right basilar atelectasis stable. No pneumothorax. Normal heart size. Deformity of the peripheral left clavicle.  IMPRESSION: Improved left base atelectasis.  Minimal right base  atelectasis.   Electronically Signed   By: Maryclare Bean M.D.   On: 04/29/2014 08:17     ASSESSMENT / PLAN:  NEUROLOGIC A:   Status epilepticus with Coma  SDH due to fall/head trauma 04/16/2014 P:   Anti-epileptics per neuro - Keppra, Vimpat, Thiamine Off sedation Now DNR  PULMONARY OETT 04/21/2014 Acute respiratory failure due to status epilepticus and acute encephalopathy P:   Weans but will not protect airway if extubated   CARDIOVASCULAR A:   Hx of HTN P:  Labetalol 5 mg IV q rh prn Not much needed, no need oral agent addition  RENAL  Recent Labs Lab 04/28/14 0228 04/29/14 0245 04/30/14 0239  NA 144 143 144    A Mild hyponatremia-->resolved Pos balance daily P:   kvo   GASTROINTESTINAL A: Cachexia with protein calorie malnutrition P:   TF per NG tube famotidine 20 mg BID colace, mirlax daily   HEMATOLOGY At risk for anemia of critical illness  P:  scd  INFECTIOUS A:   No evidence of infection   P:   Rocephin 11/23 >> 11/28   ENDOCRINE CBG (last 3)   Recent Labs  04/29/14 2034 04/29/14 2346 04/30/14 0405  GLUCAP 147* 188* 161*     A:   DM, hyperglycemia  P:   Ssi, maintain blood glucose < 180 lantus 10 Units QHS, increase to 35 Continue novolog ssi Add T f  coverage 3 units  Husband & daughter updated in room 12/5  Richardson Landry Minor ACNP Maryanna Shape PCCM Pager [phone] till 3 pm If no answer page [phone]  TODAY'S SUMMARY: Not waking up yet - 6 days off sedation in setting of SDH, new infarcts on MRI Now DNR, waiting on family to decide on timing of one way extubation here   The patient is critically ill with multiple organ systems failure and requires high complexity decision making for assessment and support, frequent evaluation and titration of therapies, application of advanced monitoring technologies and extensive interpretation of multiple databases. Critical Care Time devoted to patient care services described in this note  independent of APP time is 31 minutes.   Rigoberto Noel MD

## 2014-04-30 NOTE — Progress Notes (Signed)
STROKE TEAM PROGRESS NOTE   SUBJECTIVE: Patient has been weaned off of the Propofol and no benzo in >7 days. Stopped vimpat on 12/02. No improvement in mental status.  Per RN no overnight events.   Had family meeting evening of 12/04 with spouse and daughter. Explained poor prognosis of meaningful recovery at this point. They expressed understanding. PCCM, Dr Titus Mould, also discussed prognosis and plan of care with family. They expressed understanding. Will make patient DNR and consider their options at this time.   HISTORY Diane Stevenson is an 80 y.o. female with a past medical history significant for HTN, DM type II, bilateral breast cancer, DJD, admitted to [facility] due to right hemispheric and right tentorium post traumatic SDH after a fall who has been experiencing fluctuating mental state changes and new onset seizures today. Patient follow by neurosurgery and no aggressive neurosurgical intervention recommended. Nursing staff reports off and on change in mental status, and recurrent but brief episodes of uncontrollable jerkiness involving the left leg greater than the right leg but with preservation of consciousness. Of note, she received some narcotics yesterday. Patient's fluctuation on mental status prompted a repeat CT brain yesterday which showed unchanged SDH in the locations described above. At this moment, patient is awake, follows simple commands, doesn't engage in conversation, and is having bouts of short lasting clonic movements involving the left greater than the right leg..  Serologies showed hyponatremia that is thought to be chronic.  The patient was not treated with TPA secondary to posttraumatic subdural hematoma.  Continuous EEG: 1. No further seizures noted  2. Right fronto-temporal spike wave discharges 3. Multi-regional spike-wave and sharp-wave discharges on the left (left frontocentral, left frontotemporal and posterior temporal-parietal, primarily) 4. Moderate  diffuse slowing, polymorphic delta/theta  OBJECTIVE Temp:  [98.7 F (37.1 C)-102.2 F (39 C)] 98.7 F (37.1 C) (12/06 0400) Pulse Rate:  [78-96] 95 (12/06 0700) Cardiac Rhythm:  [-] Normal sinus rhythm (12/05 2000) Resp:  [15-24] 15 (12/06 0700) BP: (109-158)/(45-70) 136/64 mmHg (12/06 0700) SpO2:  [97 %-99 %] 98 % (12/06 0700) FiO2 (%):  [30 %] 30 % (12/06 0600) Weight:  [67.8 kg (149 lb 7.6 oz)] 67.8 kg (149 lb 7.6 oz) (12/06 0500)   Recent Labs Lab 04/29/14 1146 04/29/14 1535 04/29/14 2034 04/29/14 2346 04/30/14 0405  GLUCAP 194* 179* 147* 188* 161*    Recent Labs Lab 04/25/14 0216 04/26/14 0310 04/28/14 0228 04/29/14 0245 04/30/14 0239  NA 139 143 144 143 144  K 4.2 4.4 4.1 4.1 3.9  CL 104 107 107 107 108  CO2 23 24 26 24 25   GLUCOSE 288* 244* 215* 174* 173*  BUN 31* 30* 34* 32* 40*  CREATININE 0.52 0.48* 0.51 0.50 0.58  CALCIUM 8.7 8.9 8.7 8.8 8.5    Recent Labs Lab 04/25/14 0216  AST 35  ALT 54*  ALKPHOS 128*  BILITOT 0.5  PROT 6.2  ALBUMIN 2.3*    Recent Labs Lab 04/24/14 0225 04/25/14 0216 04/26/14 0310 04/30/14 0239  WBC 6.7 7.9 7.9 10.6*  NEUTROABS 5.0 5.9 5.6  --   HGB 9.6* 9.7* 9.8* 9.7*  HCT 29.3* 30.3* 30.9* 30.3*  MCV 99.0 100.3* 101.3* 104.1*  PLT 241 261 241 326   No results for input(s): CKTOTAL, CKMB, CKMBINDEX, TROPONINI in the last 168 hours. No results for input(s): LABPROT, INR in the last 72 hours.  Recent Labs  04/28/14 1139  COLORURINE YELLOW  LABSPEC 1.028  PHURINE 6.0  GLUCOSEU NEGATIVE  HGBUR NEGATIVE  BILIRUBINUR NEGATIVE  KETONESUR NEGATIVE  PROTEINUR NEGATIVE  UROBILINOGEN 4.0*  NITRITE NEGATIVE  LEUKOCYTESUR NEGATIVE       Component Value Date/Time   CHOL 122 04/19/2014 0321   TRIG 100 04/21/2014 2144   HDL 38* 04/19/2014 0321   CHOLHDL 3.2 04/19/2014 0321   VLDL 16 04/19/2014 0321   LDLCALC 68 04/19/2014 0321   Lab Results  Component Value Date   HGBA1C 6.2* 04/21/2014   No results  found for: LABOPIA, COCAINSCRNUR, LABBENZ, AMPHETMU, THCU, LABBARB  No results for input(s): ETH in the last 168 hours.    EEG 04/22/2014 This is an abnormal 24 hr video-EEG recording.  Significant findings include: 1. Recurrent focal seizures, primarily nonconvulsive, arising from from the right frontal lobe; none after midnight (04/22/2014) 2. Right frontal periodic epileptiform discharges (PLEDS) 3. Diffuse slowing on the left, low voltage polymorphic theta/delta 4. Suppression, right hemisphere   2-D echocardiogram  04/21/2014 Left ventricle: The cavity size was normal. Wall thickness was increased in a pattern of mild LVH. Systolic function was normal. The estimated ejection fraction was in the range of 60% to 65%. Wall motion was normal; there were no regional wall motion abnormalities. Doppler parameters are consistent with abnormal left ventricular relaxation (grade 1 diastolic dysfunction). The E/e&' ratio is between 8-15, suggesting indeterminate LV filling pressure. - Left atrium: The atrium was normal in size. - Tricuspid valve: There was mild regurgitation. - Pulmonary arteries: PA peak pressure: 36 mm Hg (S).  Impressions:  - LVEF 60-65%, mild LVH, normal wall motion, diastolic dysfunction, indeterminate LV filling pressure. mild TR, RVSP 36 mmHg.    Ct Head Wo Contrast 04/19/2014    1. Stable since 04/18/2014 parafalcine and right hemispheric subdural hematoma. Stable associated mass effect including partial effacement of the right lateral ventricle.  2. No new intracranial abnormality.      Mr Brain Wo Contrast 04/20/2014    1. Subdural hematoma re - identified in the right hemisphere and stable from recent CTs, including lobulated component along the interhemispheric fissure. Small component in the right posterior fossa.  2. Subtle signal abnormality in the posterior right cingulate gyrus adjacent to the parafalcine hematoma in this setting  is suggestive of gyral edema due to recent seizure.  3. Superimposed small acute lacunar infarct in the left thalamus. Possible second tiny lacune in the left occipital pole. No hemorrhage or mass effect associated with these.  4. Underlying chronic small vessel disease.       PHYSICAL EXAM Elderly Caucasian lady who is unresponsive, intubated. No eye opening. Not following commands. Head is nontraumatic. Neck is supple without bruit. Cardiac exam no murmur or gallop. Intubated.  Distal pulses are well felt. Neurological Exam : Not following commands. Pupils equal, sluggish. Conjugate gaze. Minimally withdraws to painful stimuli LUE and  bilateral lowers L>R. Plantars mute.    ASSESSMENT/PLAN Diane Stevenson is a 80 y.o. female with history of hypertension, diabetes mellitus, bilateral breast cancer, and DJD presenting with a posttraumatic subdural hematoma, status epilepticus. She did did not receive intravenous TPA secondary to hemorrhage.   Current active problem is encephalopathy of unclear etiology. Patient has known SDH, new infarcts on MRI and new onset seizure since admission. She has been off sedation for >5 days but continues to be obtunded. Continuous EEG shows right fronto-temporal spike wave discharges and multiregional spike wave and sharp-wave discharges on the left. There is moderate diffuse slowing.   Repeat head CT shows slight improvement. Patient has failed  to show improvement in mental status even with being off all sedation for >7 days. Awaiting family decision on extubation, goals of care.  Will follow as needed in the future.  Jim Like, DO [facility] [phone]  If 7pm- 7am, please page neurology on call as listed in [place].    To contact Stroke Continuity provider, please refer to [email or web address] After hours, contact General Neurology

## 2014-05-01 ENCOUNTER — Inpatient Hospital Stay (HOSPITAL_COMMUNITY): Payer: Medicare Other

## 2014-05-01 LAB — CBC
HCT: 30.5 % — ABNORMAL LOW (ref 36.0–46.0)
Hemoglobin: 9.8 g/dL — ABNORMAL LOW (ref 12.0–15.0)
MCH: 33.8 pg (ref 26.0–34.0)
MCHC: 32.1 g/dL (ref 30.0–36.0)
MCV: 105.2 fL — ABNORMAL HIGH (ref 78.0–100.0)
Platelets: 297 10*3/uL (ref 150–400)
RBC: 2.9 MIL/uL — ABNORMAL LOW (ref 3.87–5.11)
RDW: 14.8 % (ref 11.5–15.5)
WBC: 9.6 10*3/uL (ref 4.0–10.5)

## 2014-05-01 LAB — BASIC METABOLIC PANEL
Anion gap: 13 (ref 5–15)
BUN: 40 mg/dL — AB (ref 6–23)
CHLORIDE: 107 meq/L (ref 96–112)
CO2: 24 mEq/L (ref 19–32)
Calcium: 8.4 mg/dL (ref 8.4–10.5)
Creatinine, Ser: 0.52 mg/dL (ref 0.50–1.10)
GFR calc non Af Amer: 87 mL/min — ABNORMAL LOW (ref >90)
Glucose, Bld: 190 mg/dL — ABNORMAL HIGH (ref 70–99)
POTASSIUM: 4 meq/L (ref 3.7–5.3)
SODIUM: 144 meq/L (ref 137–147)

## 2014-05-01 LAB — GLUCOSE, CAPILLARY
GLUCOSE-CAPILLARY: 196 mg/dL — AB (ref 70–99)
Glucose-Capillary: 135 mg/dL — ABNORMAL HIGH (ref 70–99)
Glucose-Capillary: 174 mg/dL — ABNORMAL HIGH (ref 70–99)

## 2014-05-01 MED ORDER — MORPHINE SULFATE 2 MG/ML IJ SOLN
2.0000 mg | INTRAMUSCULAR | Status: DC | PRN
  Administered 2014-05-01 – 2014-05-06 (×4): 2 mg via INTRAVENOUS
  Filled 2014-05-01 (×4): qty 1

## 2014-05-01 NOTE — Progress Notes (Signed)
PULMONARY / CRITICAL CARE MEDICINE   Name: Diane Stevenson MRN: [id number] DOB: June 28, 1929   LOS 8 days PCP Irven Shelling, MD   ADMISSION DATE:  04/16/2014 CONSULTATION DATE:  04/21/14  REFERRING MD :  Dr Dorian Pod of neuro and Dr Zawadi [facility]  CC:  Status epilepticus with risk for respiratory compromise  INITIAL PRESENTATION:  80 year old female who is cachectic and multiple medical problems including breast cancer 1997 and type 2 diabetes sustained subdural hematoma in the right frontal lobe FALCINE SDH after a fall on 04/16/2014. In addition she also sustained 3 fractures of T1, T6 and T10 above her kyphoplasty's at T11-T12 along with nondisplaced right 11th rib fracture. All this  was deemed nonoperative by neurosurgery and she was admitted to stepdown ICU by the tried hospitalist service. She had acute mental status change and CT head showed expansion of the subdural hematoma. Then on 04/20/2014 she developed in association wit chronic hyponatremia new onset seizures of clonic movements involving the left greater than the right leg and also the left upper extremity. EEG on 04/21/2014 showed status epilepticus despite medical management and patient was moved to the intensive care unit under the critical care medicine service for respiratory compromise, and her tracheal intubation and acute respiratory failure and controlling seizures with DIPRIVAN. PCCM will be primary from 04/21/14  SIGNIFICANT EVENTS: 04/16/2014 - admit for fall with acute subdural hematoma - non-surgical 04/21/14 - ICU transfer, PCCM primary and Intubation for diprivan gtt and continuous EEG 04/22/14 - off diprivan gtt 12/1 ct head>>> slight decrease SDH  SUBJECTIVE: no response to stimuli Low gr fever  VITAL SIGNS: BP 144/55 mmHg  Pulse 74  Temp(Src) 98.2 F (36.8 C) (Oral)  Resp 18  Ht 5\' 5"  (1.651 m)  Wt 68.4 kg (150 lb 12.7 oz)  BMI 25.09 kg/m2  SpO2 98%  HEMODYNAMICS:    VENTILATOR SETTINGS: Vent Mode:  [-] PSV;CPAP FiO2 (%):  [30 %] 30 % Set Rate:  [15 bmp] 15 bmp Vt Set:  [450 mL] 450 mL PEEP:  [5 cmH20] 5 cmH20 Pressure Support:  [5 cmH20] 5 cmH20 Plateau Pressure:  [14 cmH20] 14 cmH20 INTAKE / OUTPUT:   Intake/Output Summary (Last 24 hours) at 05/01/14 1058 Last data filed at 05/01/14 1000  Gross per 24 hour  Intake   1693 ml  Output   1100 ml  Net    593 ml   PHYSICAL EXAMINATION: General: Frail cachectic female  Neuro:Does not open eyes spont nor follow commands, pupils unchanged,negatve doll's eyes HEENT:perr 2-3 mm Cardiovascular: S1 S2 RRR Lungs: CTA Abdomen: Soft, no r/g, BS wnl Musculoskeletal: increased pedal edema Skin:  wnl  LABS: PULMONARY  Recent Labs  Recent Labs Lab 04/29/14 0245 04/30/14 0239 05/01/14 0500  NA 143 144 144  K 4.1 3.9 4.0  CL 107 108 107  CO2 24 25 24   BUN 32* 40* 40*  CREATININE 0.50 0.58 0.52  GLUCOSE 174* 173* 190*    Recent Labs Lab 04/26/14 0310 04/30/14 0239 05/01/14 0500  HGB 9.8* 9.7* 9.8*  HCT 30.9* 30.3* 30.5*  WBC 7.9 10.6* 9.6  PLT 241 326 297     COAGULATION  Lab Results  Component Value Date   INR 1.24 04/23/2014   INR 1.16 04/17/2014   INR 1.05 08/19/2012      CARDIAC    Recent Labs Lab 04/17/14 0810 04/17/14 1430  TROPONINI <0.30 <0.30    Recent Labs Lab 04/22/14 0010  PROBNP 727.4*  ENDOCRINE CBG (last 3)   CBG (last 3)   Recent Labs  04/30/14 2353 05/01/14 0352 05/01/14 0802  GLUCAP 137* 135* 174*       IMAGING x48h Dg Chest Port 1 View  05/01/2014   CLINICAL DATA:  Respiratory failure.  EXAM: PORTABLE CHEST - 1 VIEW  COMPARISON:  04/29/2014.  FINDINGS: Endotracheal tube and NG tube in stable position. September atelectasis left lung base. Lungs are otherwise clear. No pleural effusion or pneumothorax. Heart size normal. Prior lower thoracic vertebroplasties.  IMPRESSION: 1. Lines and tubes in stable position. 2. Stable  left base subsegmental atelectasis.   Electronically Signed   By: Marcello Moores  Register   On: 05/01/2014 07:15     ASSESSMENT / PLAN:  NEUROLOGIC A:   Status epilepticus with Coma  SDH due to fall/head trauma 04/16/2014 P:  \Plan extubation 12/7 with no reintubation. Doubt she will protect airway. Family aware, discussed with them am 12/7 Anti-epileptics per neuro - Keppra, Vimpat, Thiamine Now DNR  PULMONARY OETT 04/21/2014 Acute respiratory failure due to status epilepticus and acute encephalopathy P:   Weans but will not protect airway if extubated Extubate 12/7 for comfort if she fails   CARDIOVASCULAR A:   Hx of HTN P:  Labetalol 5 mg IV q rh prn Not much needed, no need oral agent addition  RENAL  Recent Labs Lab 04/29/14 0245 04/30/14 0239 05/01/14 0500  NA 143 144 144    A Mild hyponatremia-->resolved Pos balance daily P:   kvo   GASTROINTESTINAL A: Cachexia with protein calorie malnutrition P:   D/c TF per NG tube on 12/7  HEMATOLOGY At risk for anemia of critical illness  P:  scd  INFECTIOUS A:   No evidence of infection  P:   Rocephin 11/23 >> 11/28   ENDOCRINE A:   DM, hyperglycemia  P:   D/c CBG's and SSI  D/c TF coverage 3 units  Husband & daughter updated in room 12/5   Independent CC Time 35 minutes  Baltazar Apo, MD, PhD 05/01/2014, 10:58 AM [facility] [phone] or if no answer [phone]

## 2014-05-01 NOTE — Progress Notes (Signed)
UR Completed.  Vergie Living [phone] 05/01/2014

## 2014-05-01 NOTE — Procedures (Signed)
Extubation Procedure Note  Patient Details:   Name: Diane Stevenson DOB: January 06, 1930 MRN: [id number]   Airway Documentation:  Airway 7.5 mm (Active)  Secured at (cm) 21 cm 05/01/2014  7:54 AM  Measured From Lips 05/01/2014  7:54 AM  Secured Location Left 05/01/2014  7:54 AM  Secured By [employer] 05/01/2014  7:54 AM  Tube Holder Repositioned Yes 05/01/2014  7:54 AM  Cuff Pressure (cm H2O) 26 cm H2O 05/01/2014  4:12 AM  Site Condition Dry 05/01/2014  7:54 AM    Evaluation  O2 sats: stable throughout Complications: No apparent complications Patient did tolerate procedure well. Bilateral Breath Sounds: Clear, Diminished Suctioning: Airway No   Pt was extubated to 2 lpm nasal cannula per MD order. Pt had positive cuff leak. Pt has strong productive cough. No stridor noted. Vitals stable. BBS clear. [name]. RN at bedside. RT will continue to monitor.   Falana Clagg M 05/01/2014, 12:58 PM

## 2014-05-02 DIAGNOSIS — Z66 Do not resuscitate: Secondary | ICD-10-CM

## 2014-05-02 DIAGNOSIS — Z515 Encounter for palliative care: Secondary | ICD-10-CM

## 2014-05-02 NOTE — Progress Notes (Signed)
PULMONARY / CRITICAL CARE MEDICINE   Name: Diane Stevenson MRN: [id number] DOB: Oct 27, 1929   LOS 8 days PCP Irven Shelling, MD   ADMISSION DATE:  04/16/2014 CONSULTATION DATE:  04/21/14  REFERRING MD :  Dr Dorian Pod of neuro and Dr Faye [facility]  CC:  Status epilepticus with risk for respiratory compromise  INITIAL PRESENTATION:  80 yo female had fall with SDH, T1/T6/T10 fx, Rt rib fx 04/16/14.  Developed hyponatremia and new onset seizures 11/26 and she was transferred to ICU.  SIGNIFICANT EVENTS: 11/22 admit for fall with acute subdural hematoma - non-surgical 11/27 ICU transfer, PCCM primary and Intubation for diprivan gtt and continuous EEG 11/28 off diprivan gtt 12/07 Extubated, comfort care  STUDIES: 12/1 ct head>>> slight decrease SDH  SUBJECTIVE:  Unresponsive.  VITAL SIGNS: BP 160/66 mmHg  Pulse 81  Temp(Src) 100.6 F (38.1 C) (Axillary)  Resp 21  Ht 5\' 5"  (1.651 m)  Wt 150 lb 12.7 oz (68.4 kg)  BMI 25.09 kg/m2  SpO2 97%  PHYSICAL EXAMINATION: General: no distress Neuro: comatose HEENT: roving eye movements Cardiovascular: regular Lungs: decreased breath sounds Abdomen: soft Musculoskeletal: 1+ edema Skin: no rashes  LABS: CBC Recent Labs     04/30/14  0239  05/01/14  0500  WBC  10.6*  9.6  HGB  9.7*  9.8*  HCT  30.3*  30.5*  PLT  326  297    BMET Recent Labs     04/30/14  0239  05/01/14  0500  NA  144  144  K  3.9  4.0  CL  108  107  CO2  25  24  BUN  40*  40*  CREATININE  0.58  0.52  GLUCOSE  173*  190*    Electrolytes Recent Labs     04/30/14  0239  05/01/14  0500  CALCIUM  8.5  8.4    Glucose Recent Labs     04/30/14  1523  04/30/14  1952  04/30/14  2353  05/01/14  0352  05/01/14  0802  05/01/14  1146  GLUCAP  141*  156*  137*  135*  174*  196*    Imaging Dg Chest Port 1 View  05/01/2014   CLINICAL DATA:  Respiratory failure.  EXAM: PORTABLE CHEST - 1 VIEW  COMPARISON:  04/29/2014.   FINDINGS: Endotracheal tube and NG tube in stable position. September atelectasis left lung base. Lungs are otherwise clear. No pleural effusion or pneumothorax. Heart size normal. Prior lower thoracic vertebroplasties.  IMPRESSION: 1. Lines and tubes in stable position. 2. Stable left base subsegmental atelectasis.   Electronically Signed   By: Marcello Moores  Register   On: 05/01/2014 07:15    ASSESSMENT:  Status epilepticus with Coma. SDH due to fall/head trauma 04/16/2014. Acute respiratory failure due to status epilepticus and acute encephalopathy Hx of HTN Mild hyponatremia Cachexia with protein calorie malnutrition DM, hyperglycemia   PLAN:  DNR/DNI Continue keppra PRN ativan, fentanyl  Updated pts daughter at bedside.  Chesley Mires, MD [facility] Pulmonary/Critical Care 05/02/2014, 10:15 AM Pager:  [phone] After 3pm call: [phone]

## 2014-05-02 NOTE — Care Management Note (Addendum)
  Page 2 of 2   05/05/2014     2:38:15 PM CARE MANAGEMENT NOTE 05/05/2014  Patient:  Diane Stevenson, Diane Stevenson   Account Number:  [id number]  Date Initiated:  04/19/2014  Documentation initiated by:  Marvetta Gibbons  Subjective/Objective Assessment:   Pt admitted with SDH     Action/Plan:   PTA pt lived at home with spouse- PT/OT evals pending- will f/u for recommendations   Anticipated DC Date:  05/05/2014   Anticipated DC Plan:        DC Planning Services  CM consult      Choice offered to / List presented to:             Status of service:  In process, will continue to follow Medicare Important Message given?  YES (If response is "NO", the following Medicare IM given date fields will be blank) Date Medicare IM given:  05/05/2014 Medicare IM given by:  Marvetta Gibbons Date Additional Medicare IM given:  05/02/2014 Additional Medicare IM given by:  Magdalen Spatz  Discharge Disposition:    Per UR Regulation:  Reviewed for med. necessity/level of care/duration of stay  If discussed at [place] of Stay Meetings, dates discussed:   04/25/2014  04/27/2014  05/02/2014  05/04/2014    Comments:  05-05-14 [facility] . Magdalen Spatz RN BSN     05-05-14 [name] to see today . Magdalen Spatz RN BSN   05-05-14 Spoke with Lanelle Bal at [facility] regarding GIP . Updated [name] on patient's VS . She will call NCM . Magdalen Spatz RN BSN  05-04-14 Spoke with Mechele Claude at [facility] , they have multiple referrals today , they will follow up tomorrow. Magdalen Spatz RN BSN   05-03-14 GIP referral to [facility] .    05-01-14 Loel Lofty, [place] Plan for one way extubation - time to be determined.  04/27/2014 Sandi Mariscal, RN BSN [facility] CCM [phone]--Pt now in ICU. Began having mental status deterioration on 11/24.  By 11/27, she was in status epilepticus on  continuous EEG and was intubated. Has remained in ICU intubated since that time. Now a DNR. Further family discussions planned to determine progression of care.  04/19/14- 65- Marvetta Gibbons RN, BSN [phone] Spoke with spouse at bedside- per conversation pt uses a walker at home- they have used [facility] for services in past- will await PT/OT evals for recommendations.

## 2014-05-03 MED ORDER — SCOPOLAMINE 1 MG/3DAYS TD PT72
1.0000 | MEDICATED_PATCH | TRANSDERMAL | Status: DC
  Administered 2014-05-03 – 2014-05-06 (×2): 1.5 mg via TRANSDERMAL
  Filled 2014-05-03 (×2): qty 1

## 2014-05-03 NOTE — Progress Notes (Signed)
Discussion with daughter Butch Penny who is also HCPOA.  Butch Penny is excepting of the terminal situation with her mother.  Goals are for comfort and EOL symptom management.  She has seen a decline in her mothers status even in the past 24 hrs.  Hospice services discussed including Hospice facility and GIP option.  Patient is unresponsive at this point receiving IV Keppra for the onset of seizure activity, PRN Morphine and Ativan are ordered.  At this point I believe that the patient is to fragile to move out of hospital, due to extent of seizures and possibility of causing distress, pain and additional seizure activity.  Will assess in the AM and make referral as appropriate.  Kizzie Fantasia, RN, MSN, [facility] Palliative Integration

## 2014-05-03 NOTE — Progress Notes (Signed)
PULMONARY / CRITICAL CARE MEDICINE   Name: Diane Stevenson MRN: [id number] DOB: Apr 09, 1930   LOS 8 days PCP Irven Shelling, MD   ADMISSION DATE:  04/16/2014 CONSULTATION DATE:  04/21/14  REFERRING MD :  Dr Dorian Pod of neuro and Dr Jasia [facility]  CC:  Status epilepticus with risk for respiratory compromise  INITIAL PRESENTATION:  80 yo female had fall with SDH, T1/T6/T10 fx, Rt rib fx 04/16/14.  Developed hyponatremia and new onset seizures 11/26 and she was transferred to ICU. Required intubation in setting AMS/status epilepticus and rx with propofol.  Poor prognosis from neuro standpoint, remained comatose despite being off propofol, benzo's for >5 days and made DNR on 12/4 and ultimately extubated for full comfort care 12/7.   SIGNIFICANT EVENTS: 11/22 admit for fall with acute subdural hematoma - non-surgical 11/27 ICU transfer, PCCM primary and Intubation for diprivan gtt and continuous EEG 11/28 off diprivan gtt 12/07 Extubated, comfort care  STUDIES: 12/1 ct head>>> slight decrease SDH  SUBJECTIVE:  Unresponsive. Appears comfortable.   VITAL SIGNS: BP 155/67 mmHg  Pulse 76  Temp(Src) 99.4 F (37.4 C) (Axillary)  Resp 14  Ht 5\' 5"  (1.651 m)  Wt 150 lb 12.7 oz (68.4 kg)  BMI 25.09 kg/m2  SpO2 94%  PHYSICAL EXAMINATION: General: no distress Neuro: comatose HEENT: roving eye movements, oral secretions  Cardiovascular: regular Lungs: few scattered rhonchi, resps even non labored  Abdomen: soft Musculoskeletal: 2+ gen edema Skin: no rashes  LABS: CBC Recent Labs     05/01/14  0500  WBC  9.6  HGB  9.8*  HCT  30.5*  PLT  297    BMET Recent Labs     05/01/14  0500  NA  144  K  4.0  CL  107  CO2  24  BUN  40*  CREATININE  0.52  GLUCOSE  190*    Electrolytes Recent Labs     05/01/14  0500  CALCIUM  8.4    Imaging No results found.  ASSESSMENT:  Status epilepticus with Coma. SDH due to fall/head trauma  04/16/2014. Acute respiratory failure due to status epilepticus and acute encephalopathy Hx of HTN Mild hyponatremia Cachexia with protein calorie malnutrition DM, hyperglycemia   PLAN:  DNR/DNI Full comfort care - no further labs, x-rays, etc Continue keppra PRN ativan, fentanyl Will add scopolamine patch   Updated husband at bedside 12/9.   Nickolas Madrid, NP 05/03/2014  9:27 AM Pager: [phone] or [phone]  Reviewed above, examined.  She is appears comfortable.  Remains comatose.  Discussed option of residential hospice with pt's family >> they had concerns/questions about this >> advised them to d/w palliative care team.  Chesley Mires, MD [place] 05/03/2014, 11:14 AM Pager:  [phone] After 3pm call: [phone]

## 2014-05-03 NOTE — Progress Notes (Signed)
Met at bedside with patient husband.  I confirmed a discussion with Butch Penny, his daughter last evening regarding hospice services and the terminal situation Ms. Abrahamsen has now entered.  He is agreeable to [facility] hospice referral..  Patient is unresponsive, pale with shallow respiratory effort. Onset of seizure activity requiring IV Keppra and PRN medications was initiated for symptom management.  PRN Ativan is ordered for any breakthrough seizure activity as well as IV Morphine for pain and/or dyspnea.   Patient is not stable for transport OOF due to risk of symptom cascade/triggered seizure activity.  Prognosis is hours to days.  Family will benefit from supportive services now and bereavement services after death.     RNCM notified and consulted to make referral.  Kizzie Fantasia, RN, MSN, [facility] Palliative Integration

## 2014-05-03 NOTE — Progress Notes (Signed)
NUTRITION FOLLOW-UP  INTERVENTION: No further nutrition interventions warranted; pt is now full comfort care RD signing off  NUTRITION DIAGNOSIS: Inadequate oral intake related to inability to eat as evidenced by NPO/Vent status; ongoing.   Goal: Pt to meet >/= 90% of their estimated nutrition needs; discontinued  New Goal: Comfort  Monitor:  TF tolerance, vent status, weight trend, labs   ASSESSMENT: 80 year old female who is cachectic and multiple medical problems including breast cancer 1997 and type 2 diabetes sustained subdural hematoma in the right frontal lobe FALCINE SDH after a fall on 04/16/2014. In addition she also sustained 3 fractures of T1, T6 and T10 above her kyphoplasty's at T11-T12 along with nondisplaced right 11th rib fracture. She was being medically managed on the floor then and all along she had some intermittent confusion on the 2015 she had acute mental status change and CT head showed expansion of the subdural hematoma.   Patient was made DNR on 12/4 and was extubated for full comfort on 12/7. Per MD note, pt is unresponsive.   Height: Ht Readings from Last 1 Encounters:  04/21/14 5\' 5"  (1.651 m)    Weight: Wt Readings from Last 1 Encounters:  05/01/14 150 lb 12.7 oz (68.4 kg)  Admission weight 145 lb (66.2 kg)  BMI:  Body mass index is 25.09 kg/(m^2).  Estimated Nutritional Needs: Kcal: 1400 Protein: 90-105 grams Fluid: 1.7-1.9 L/day  Skin: intact  Diet Order: Diet NPO time specified   Intake/Output Summary (Last 24 hours) at 05/03/14 1300 Last data filed at 05/03/14 0957  Gross per 24 hour  Intake 404.17 ml  Output   1250 ml  Net -845.83 ml    Last BM: 12/8   Labs:   Recent Labs Lab 04/29/14 0245 04/30/14 0239 05/01/14 0500  NA 143 144 144  K 4.1 3.9 4.0  CL 107 108 107  CO2 24 25 24   BUN 32* 40* 40*  CREATININE 0.50 0.58 0.52  CALCIUM 8.8 8.5 8.4  GLUCOSE 174* 173* 190*    CBG (last 3)   Recent Labs   05/01/14 0352 05/01/14 0802 05/01/14 1146  GLUCAP 135* 174* 196*    Scheduled Meds: . levETIRAcetam  1,000 mg Intravenous Q12H  . scopolamine  1 patch Transdermal Q72H  . sodium chloride  3 mL Intravenous Q12H    Continuous Infusions: . sodium chloride 10 mL/hr at 04/28/14 2300   Pryor Ochoa RD, LDN Inpatient Clinical Dietitian Pager: [phone] After Hours Pager: [phone]

## 2014-05-04 DIAGNOSIS — Z66 Do not resuscitate: Secondary | ICD-10-CM

## 2014-05-04 DIAGNOSIS — Z515 Encounter for palliative care: Secondary | ICD-10-CM

## 2014-05-04 NOTE — Progress Notes (Signed)
PULMONARY / CRITICAL CARE MEDICINE   Name: Diane Stevenson MRN: [id number] DOB: 1930-04-21   LOS 8 days PCP Irven Shelling, MD   ADMISSION DATE:  04/16/2014 CONSULTATION DATE:  04/21/14  REFERRING MD :  Dr Dorian Pod of neuro and Dr Taneeka [facility]  CC:  Status epilepticus with risk for respiratory compromise  INITIAL PRESENTATION:  80 yo female had fall with SDH, T1/T6/T10 fx, Rt rib fx 04/16/14.  Developed hyponatremia and new onset seizures 11/26 and she was transferred to ICU. Required intubation in setting AMS/status epilepticus and rx with propofol.  Poor prognosis from neuro standpoint, remained comatose despite being off propofol, benzo's for >5 days and made DNR on 12/4 and ultimately extubated for full comfort care 12/7.   SIGNIFICANT EVENTS: 11/22 admit for fall with acute subdural hematoma - non-surgical 11/27 ICU transfer, PCCM primary and Intubation for diprivan gtt and continuous EEG 11/28 off diprivan gtt 12/07 Extubated, comfort care  STUDIES: 12/1 ct head>>> slight decrease SDH  SUBJECTIVE:  Remains comatose, appears comfortable requiring minimal PRN rx.   VITAL SIGNS: BP 149/70 mmHg  Pulse 104  Temp(Src) 100.8 F (38.2 C) (Axillary)  Resp 14  Ht _0  (1.651 m)  Wt 150 lb 12.7 oz (68.4 kg)  BMI 25.09 kg/m2  SpO2 97%  PHYSICAL EXAMINATION: General: appears comfortable Neuro: comatose HEENT: roving eye movements, oral secretions  Cardiovascular: regular Lungs: few scattered rhonchi, resps even non labored  Abdomen: soft Musculoskeletal: 2+ gen edema Skin: no rashes  LABS: CBC No results for input(s): WBC, HGB, HCT, PLT in the last 72 hours.  BMET No results for input(s): NA, K, CL, CO2, BUN, CREATININE, GLUCOSE in the last 72 hours.  Electrolytes No results for input(s): CALCIUM, MG, PHOS in the last 72 hours.  Imaging No results found.  ASSESSMENT:  Status epilepticus with Coma. SDH due to fall/head trauma  04/16/2014. Acute respiratory failure due to status epilepticus and acute encephalopathy Hx of HTN Mild hyponatremia Cachexia with protein calorie malnutrition DM, hyperglycemia    PLAN: DNR/DNI Full comfort care - no further labs, x-rays, etc Continue keppra PRN ativan, fentanyl scopolamine patch   Not stable for transfer to residential hospice, prognosis is hours to days .  Palliative care met with family yesterday.  Awaiting Hospice approval for ?transition to GIP.    Nickolas Madrid, NP 05/04/2014  8:50 AM Pager: [phone] or [phone]  Reviewed above, examined.  She appears comfortable.  Comatose.  Await input from palliative care for GIP.  Updated pt's husband at bedside.  Chesley Mires, MD [facility] Pulmonary/Critical Care 05/04/2014, 9:31 AM Pager:  [phone] After 3pm call: [phone]

## 2014-05-04 NOTE — Progress Notes (Signed)
[place] has scheduled a time to meet with Butch Penny in the morning for GIP admission and election of Medicare Hospice benefit discussion.  Will follow up first thing in the morning.    Kizzie Fantasia, RN, MSN, [facility] Palliative Integration [phone] cell

## 2014-05-05 NOTE — Progress Notes (Signed)
Confirmed with [facility] that patient is not stable for transfer, she will be admitted to [facility] hospice benefit on 12/12 Butch Penny will meet to sign paperwork in her mothers room at 97.

## 2014-05-05 NOTE — Progress Notes (Signed)
PULMONARY / CRITICAL CARE MEDICINE   Name: Diane Stevenson MRN: [id number] DOB: [birth year]   LOS 8 days PCP Irven Shelling, MD   ADMISSION DATE:  04/16/2014 CONSULTATION DATE:  04/21/14  REFERRING MD :  Dr Dorian Pod of neuro and Dr Zitlali [facility]  CC:  Status epilepticus with risk for respiratory compromise  INITIAL PRESENTATION:  80 yo female had fall with SDH, T1/T6/T10 fx, Rt rib fx 04/16/14.  Developed hyponatremia and new onset seizures 11/26 and she was transferred to ICU. Required intubation in setting AMS/status epilepticus and rx with propofol.  Poor prognosis from neuro standpoint, remained comatose despite being off propofol, benzo's for >5 days and made DNR on 12/4 and ultimately extubated for full comfort care 12/7.   SIGNIFICANT EVENTS: 11/22 admit for fall with acute subdural hematoma - non-surgical 11/27 ICU transfer, PCCM primary and Intubation for diprivan gtt and continuous EEG 11/28 off diprivan gtt 12/07 Extubated, comfort care  STUDIES: 12/1 ct head>>> slight decrease SDH  SUBJECTIVE:  No change in clinical status.  VITAL SIGNS: BP 144/64 mmHg  Pulse 86  Temp(Src) 99 F (37.2 C) (Axillary)  Resp 14  Ht 5\' 5"  (1.651 m)  Wt 150 lb 12.7 oz (68.4 kg)  BMI 25.09 kg/m2  SpO2 95%  PHYSICAL EXAMINATION: General: appears comfortable Neuro: comatose HEENT: no significant respiratory secretions Cardiovascular: regular Lungs: few scattered rhonchi Abdomen: soft Musculoskeletal: 2+ gen edema Skin: no rashes  ASSESSMENT:  Status epilepticus with Coma SDH due to fall/head trauma 04/16/2014 Acute respiratory failure due to status epilepticus and acute encephalopathy Hx of HTN Mild hyponatremia Cachexia with protein calorie malnutrition DM, hyperglycemia   PLAN: DNR/DNI Full comfort care Continue keppra PRN ativan, fentanyl scopolamine patch   Not stable for transfer to residential hospice, prognosis is hours to days.   [facility] scheduled meeting for 12/11 with pt's daughter to discuss GIP admission.  Updated pt's husband at bedside.  Chesley Mires, MD [facility] Pulmonary/Critical Care 05/05/2014, 11:04 AM Pager:  [phone] After 3pm call: [phone]

## 2014-05-05 NOTE — Plan of Care (Signed)
Problem: Phase II Progression Outcomes Goal: Pain within acceptable level for patient Outcome: Completed/Met Date Met:  05/05/14 Goal: Non-pain symptoms managed Outcome: Completed/Met Date Met:  05/05/14 Goal: Pt./family involved in care progression Outcome: Completed/Met Date Met:  05/05/14

## 2014-05-06 ENCOUNTER — Inpatient Hospital Stay (HOSPITAL_COMMUNITY)
Admission: RE | Admit: 2014-05-06 | Discharge: 2014-05-26 | DRG: 082 | Disposition: E | Source: Hospice | Attending: Internal Medicine | Admitting: Internal Medicine

## 2014-05-06 DIAGNOSIS — R4020 Unspecified coma: Secondary | ICD-10-CM | POA: Diagnosis present

## 2014-05-06 DIAGNOSIS — G40901 Epilepsy, unspecified, not intractable, with status epilepticus: Secondary | ICD-10-CM | POA: Diagnosis present

## 2014-05-06 DIAGNOSIS — Z7982 Long term (current) use of aspirin: Secondary | ICD-10-CM | POA: Diagnosis not present

## 2014-05-06 DIAGNOSIS — W1830XA Fall on same level, unspecified, initial encounter: Secondary | ICD-10-CM | POA: Diagnosis present

## 2014-05-06 DIAGNOSIS — S22010A Wedge compression fracture of first thoracic vertebra, initial encounter for closed fracture: Secondary | ICD-10-CM | POA: Diagnosis present

## 2014-05-06 DIAGNOSIS — S065X9A Traumatic subdural hemorrhage with loss of consciousness of unspecified duration, initial encounter: Secondary | ICD-10-CM | POA: Diagnosis present

## 2014-05-06 DIAGNOSIS — Z66 Do not resuscitate: Secondary | ICD-10-CM | POA: Diagnosis present

## 2014-05-06 DIAGNOSIS — J9601 Acute respiratory failure with hypoxia: Secondary | ICD-10-CM | POA: Diagnosis present

## 2014-05-06 DIAGNOSIS — E119 Type 2 diabetes mellitus without complications: Secondary | ICD-10-CM | POA: Diagnosis present

## 2014-05-06 DIAGNOSIS — I10 Essential (primary) hypertension: Secondary | ICD-10-CM | POA: Diagnosis present

## 2014-05-06 DIAGNOSIS — S22000A Wedge compression fracture of unspecified thoracic vertebra, initial encounter for closed fracture: Secondary | ICD-10-CM | POA: Diagnosis present

## 2014-05-06 DIAGNOSIS — Z96649 Presence of unspecified artificial hip joint: Secondary | ICD-10-CM | POA: Diagnosis present

## 2014-05-06 DIAGNOSIS — Z515 Encounter for palliative care: Secondary | ICD-10-CM | POA: Diagnosis not present

## 2014-05-06 DIAGNOSIS — S065XAA Traumatic subdural hemorrhage with loss of consciousness status unknown, initial encounter: Secondary | ICD-10-CM | POA: Diagnosis present

## 2014-05-06 DIAGNOSIS — Z853 Personal history of malignant neoplasm of breast: Secondary | ICD-10-CM

## 2014-05-06 DIAGNOSIS — M1990 Unspecified osteoarthritis, unspecified site: Secondary | ICD-10-CM | POA: Diagnosis present

## 2014-05-06 DIAGNOSIS — K219 Gastro-esophageal reflux disease without esophagitis: Secondary | ICD-10-CM | POA: Diagnosis present

## 2014-05-06 MED ORDER — SODIUM CHLORIDE 0.9 % IJ SOLN
3.0000 mL | Freq: Two times a day (BID) | INTRAMUSCULAR | Status: DC
  Administered 2014-05-10 – 2014-05-12 (×3): 3 mL via INTRAVENOUS

## 2014-05-06 MED ORDER — LEVETIRACETAM IN NACL 1000 MG/100ML IV SOLN
1000.0000 mg | Freq: Two times a day (BID) | INTRAVENOUS | Status: DC
  Administered 2014-05-06 – 2014-05-12 (×12): 1000 mg via INTRAVENOUS
  Filled 2014-05-06 (×13): qty 100

## 2014-05-06 MED ORDER — ACETAMINOPHEN 650 MG RE SUPP: 650.0000 mg | Freq: Four times a day (QID) | RECTAL | Status: DC | PRN

## 2014-05-06 MED ORDER — LORAZEPAM 2 MG/ML IJ SOLN: 1.0000 mg | INTRAMUSCULAR | Status: DC | PRN

## 2014-05-06 MED ORDER — SCOPOLAMINE 1 MG/3DAYS TD PT72
1.0000 | MEDICATED_PATCH | TRANSDERMAL | Status: DC
  Administered 2014-05-09 – 2014-05-12 (×2): 1.5 mg via TRANSDERMAL
  Filled 2014-05-06 (×2): qty 1

## 2014-05-06 MED ORDER — SODIUM CHLORIDE 0.9 % IV SOLN
INTRAVENOUS | Status: DC
  Administered 2014-05-08: 09:00:00 via INTRAVENOUS

## 2014-05-06 MED ORDER — MORPHINE SULFATE 2 MG/ML IJ SOLN
2.0000 mg | INTRAMUSCULAR | Status: DC | PRN
  Administered 2014-05-08 – 2014-05-10 (×4): 2 mg via INTRAVENOUS
  Administered 2014-05-10: 4 mg via INTRAVENOUS
  Filled 2014-05-06 (×2): qty 1
  Filled 2014-05-06: qty 2
  Filled 2014-05-06 (×2): qty 1

## 2014-05-06 NOTE — Progress Notes (Signed)
PULMONARY / CRITICAL CARE MEDICINE   Name: Diane Stevenson MRN: [id number] DOB: 06/04/1929   LOS 8 days PCP Irven Shelling, MD   ADMISSION DATE:  04/16/2014 CONSULTATION DATE:  04/21/14  REFERRING MD :  Dr Dorian Pod of neuro and Dr Taniyah Puffer [facility]  CC:  Status epilepticus with risk for respiratory compromise  INITIAL PRESENTATION:  80 yo female had fall with SDH, T1/T6/T10 fx, Rt rib fx 04/16/14.  Developed hyponatremia and new onset seizures 11/26 and she was transferred to ICU. Required intubation in setting AMS/status epilepticus and rx with propofol.  Poor prognosis from neuro standpoint, remained comatose despite being off propofol, benzo's for >5 days and made DNR on 12/4 and ultimately extubated for full comfort care 12/7.   SIGNIFICANT EVENTS: 11/22 admit for fall with acute subdural hematoma - non-surgical 11/27 ICU transfer, PCCM primary and Intubation for diprivan gtt and continuous EEG 11/28 off diprivan gtt 12/07 Extubated, comfort care  STUDIES: 12/1 ct head>>> slight decrease SDH  SUBJECTIVE:  No change in clinical status.  VITAL SIGNS: BP 167/73 mmHg  Pulse 78  Temp(Src) 98.7 F (37.1 C) (Oral)  Resp 20  Ht 5\' 5"  (1.651 m)  Wt 68.4 kg (150 lb 12.7 oz)  BMI 25.09 kg/m2  SpO2 98%  PHYSICAL EXAMINATION: General: appears comfortable Neuro: comatose. Glanced at me when I spoke, then lost attention. HEENT: no significant respiratory secretions Cardiovascular: regular Lungs: few scattered rhonchi, quiet, unlabored Abdomen: soft Musculoskeletal: 2+ gen edema Skin: no rashes  ASSESSMENT:  Status epilepticus with Coma SDH due to fall/head trauma 04/16/2014 Acute respiratory failure due to status epilepticus and acute encephalopathy Hx of HTN Mild hyponatremia Cachexia with protein calorie malnutrition DM, hyperglycemia   PLAN: DNR/DNI Full comfort care Continue keppra PRN ativan, fentanyl scopolamine patch   Not stable  for transfer to residential hospice, prognosis is hours to days.  [facility] scheduled meeting for 12/11 with pt's daughter to discuss GIP admission. Palliative Care indicates to be admitted to hospice benefit 12/12.    CD Annamaria Boots, MD [place] 05/16/2014, 9:05 AM Pager:  [phone], m- [phone] After 3pm call: [phone]

## 2014-05-06 NOTE — Progress Notes (Addendum)
05/16/2014 11:47 CM received call from [facility] who states she has accepted pt for GIP.  No other CM needs were communicated.  Mariane Masters, BSN, CM [phone].

## 2014-05-07 DIAGNOSIS — R40244 Other coma, without documented Glasgow coma scale score, or with partial score reported: Secondary | ICD-10-CM

## 2014-05-07 DIAGNOSIS — E119 Type 2 diabetes mellitus without complications: Secondary | ICD-10-CM

## 2014-05-07 DIAGNOSIS — Z66 Do not resuscitate: Secondary | ICD-10-CM

## 2014-05-07 DIAGNOSIS — S065XAA Traumatic subdural hemorrhage with loss of consciousness status unknown, initial encounter: Secondary | ICD-10-CM | POA: Diagnosis present

## 2014-05-07 DIAGNOSIS — S065X9A Traumatic subdural hemorrhage with loss of consciousness of unspecified duration, initial encounter: Secondary | ICD-10-CM | POA: Diagnosis present

## 2014-05-07 DIAGNOSIS — S22010S Wedge compression fracture of first thoracic vertebra, sequela: Secondary | ICD-10-CM

## 2014-05-07 DIAGNOSIS — G40301 Generalized idiopathic epilepsy and epileptic syndromes, not intractable, with status epilepticus: Secondary | ICD-10-CM

## 2014-05-07 DIAGNOSIS — S065X9D Traumatic subdural hemorrhage with loss of consciousness of unspecified duration, subsequent encounter: Secondary | ICD-10-CM

## 2014-05-07 NOTE — H&P (Signed)
Name: Diane Stevenson MRN: [id number] DOB: 19-Apr-1930    ADMISSION DATE:  05/21/2014 CONSULTATION DATE:  Lauro Regulus  REFERRING MD :  TRH  CHIEF COMPLAINT:  Coma  BRIEF PATIENT DESCRIPTION: Subdural hematoma, multiple trauma after fall 04/16/14.Admitted then to [facility] and subsequently on PCCM service.  Inoperable per NSGY.  Subsequent status epilepticus, AMS requiring ICU/ intubation. After 5 days off sedatives she had not recovered conciousness.  Family accepted Hospice status. She is now discharged/admitted in-situ to Hospice in-patient status. DNR/DNI.  SIGNIFICANT EVENTS   STUDIES:   HISTORY OF PRESENT ILLNESS:  Diane Stevenson is a 80 y.o. female   has a past medical history of Breast cancer (08/04/1995); Hypertension; Type 2 diabetes mellitus; Dyspareunia; Diabetes mellitus; GERD (gastroesophageal reflux disease); and DJD (degenerative joint disease).   Presented with  Patient came home from church she lost her balance and fell backwards. Hitting the back of her head on the floor and the wall. Patient did not loose consciousness. Patient had a mild headache. She sleped a bit and when she got up had pain in her upper back. Patietn was brought to [facility] ER and CT showed Acute subdural hematoma extending along the entirety of the right interhemispheric fissure and right tentorium. And Acute compression fracture the T1 vertebral body with no retropulsion. Also noted shallow subdural hematoma along the right frontal lobe. The case was discussed at length with Dr. Ellene Route with Neurosurgery who believed patient will not benefit from operative intervention at this point. He agreed to see her in AM but requested Hospitalist to admit.  Work-up revealed SDH, T1/T6/T10 fx, Rt rib fx 04/16/14. Developed hyponatremia and new onset seizures 11/26 and she was transferred to ICU. Required intubation in setting AMS/status epilepticus and rx with propofol. Poor prognosis from neuro standpoint, remained comatose  despite being off propofol, benzo's for >5 days and made DNR on 12/4 and ultimately extubated for full comfort care 12/7. Discharged 04/28/2014 administratively for change to Hospice in-patient service.  PAST MEDICAL HISTORY :   has a past medical history of Breast cancer (08/04/1995); Hypertension; Type 2 diabetes mellitus; Dyspareunia; Diabetes mellitus; GERD (gastroesophageal reflux disease); and DJD (degenerative joint disease).  has past surgical history that includes Appendectomy; Breast surgery (1998 ); Breast surgery (1993); Breast surgery (1997); Partial hip arthroplasty (1998); Foot arthrodesis, modified Mcbride; bone spur; Joint replacement; Total hip arthroplasty (08/20/2011); Eye surgery; Fracture surgery; Kyphoplasty (N/A, 01/27/2013); and Open reduction internal fixation (orif) distal radial fracture (Right, 06/09/2013). Prior to Admission medications   Medication Sig Start Date End Date Taking? Authorizing Provider  alendronate (FOSAMAX) 70 MG tablet Take 70 mg by mouth once a week. Take with a full glass of water on an empty stomach. Take on Wednesday    Historical Provider, MD  amLODipine (NORVASC) 2.5 MG tablet Take 2.5 mg by mouth daily.    Historical Provider, MD  aspirin 81 MG tablet Take 81 mg by mouth daily.    Historical Provider, MD  calcium carbonate (TUMS EX) 750 MG chewable tablet Chew 1 tablet by mouth 2 (two) times daily as needed for heartburn.    Historical Provider, MD  Calcium Carbonate-Vitamin D (CALCIUM 600 + D PO) Take 1 tablet by mouth 2 (two) times daily.    Historical Provider, MD  cholecalciferol (VITAMIN D) 1000 UNITS tablet Take 1,000 Units by mouth at bedtime.     Historical Provider, MD  docusate sodium (COLACE) 100 MG capsule Take 1 capsule (100 mg total) by mouth 2 (two)  times daily. 06/12/13   Linna Hoff, MD  ferrous sulfate 325 (65 FE) MG tablet Take 325 mg by mouth daily with breakfast.    Historical Provider, MD  FLUZONE HIGH-DOSE 0.5 ML SUSY Inject 1  Dose as directed once. 03/02/14   Historical Provider, MD  HYDROcodone-acetaminophen (NORCO) 5-325 MG per tablet Take 1 tablet by mouth every 6 (six) hours as needed for moderate pain. Patient not taking: Reported on 04/16/2014 06/12/13   Linna Hoff, MD  HYDROcodone-acetaminophen [facility]) 5-325 MG per tablet Take 1 tablet by mouth every 6 (six) hours as needed for moderate pain. 09/06/13   Liam Graham, PA-C  metFORMIN (GLUCOPHAGE) 1000 MG tablet Take 1,000 mg by mouth 2 (two) times daily with a meal.    Historical Provider, MD  polyethylene glycol powder (GLYCOLAX/MIRALAX) powder Take 17 g by mouth daily. 06/15/13   Gerlene Fee, NP  ramipril (ALTACE) 10 MG capsule Take 10 mg by mouth daily.    Historical Provider, MD  vitamin B-12 (CYANOCOBALAMIN) 1000 MCG tablet Take 1,000 mcg by mouth every other day.     Historical Provider, MD  vitamin C (ASCORBIC ACID) 500 MG tablet Take 1 tablet (500 mg total) by mouth daily. Patient not taking: Reported on 04/16/2014 06/12/13   Linna Hoff, MD   Allergies  Allergen Reactions  . Celebrex [Celecoxib] Hives and Swelling    Caused lips to swell.  . Sulfur Hives and Swelling    Caused lips to swell  . Ibuprofen Itching and Swelling  . Statins Other (See Comments)    cramps  . Nitrofuran Derivatives Rash    FAMILY HISTORY:  family history includes Bone cancer in her sister; CAD in her sister; Heart disease in her brother; Heart failure in her mother. SOCIAL HISTORY:  reports that she has never smoked. She has never used smokeless tobacco. She reports that she does not drink alcohol or use illicit drugs.  REVIEW OF SYSTEMS:  Not available from patient for current admit   SUBJECTIVE:   VITAL SIGNS: Temp:  [97.8 F (36.6 C)-98.4 F (36.9 C)] 97.8 F (36.6 C) (12/13 0504) Pulse Rate:  [80-84] 80 (12/13 0504) Resp:  [20] 20 (12/13 0504) BP: (149-161)/(68-74) 149/74 mmHg (12/13 0504) SpO2:  [96 %-97 %] 96 % (12/13 0504)  PHYSICAL  EXAMINATION: General:  Passive elderly woman, no evident distress Neuro:  Spontaneous breathing. Grimaces a little to light touch. No spontaneous movemnt HEENT:  Mouth breathing, mucosa not dry Cardiovascular:  RRR, no m, g, r Lungs:  Coarse upper airway rattle, unlabored, not seen to cough Abdomen:  Soft, quiet BS+ Musculoskeletal:  Elderly muscle tone Skin:  No visible rash or breakdown   Recent Labs Lab 05/01/14 0500  NA 144  K 4.0  CL 107  CO2 24  BUN 40*  CREATININE 0.52  GLUCOSE 190*    Recent Labs Lab 05/01/14 0500  HGB 9.8*  HCT 30.5*  WBC 9.6  PLT 297   No results found.  ASSESSMENT / PLAN:  Coma after subdural hematoma from fall, complicated by seizure. Multiple trauma and medical problems Plan- Full comfort care, DNR/DNI. Prolonged survival not expected. She is admitted to Hospice-inpatient status.  Bartholomew Crews, MD Pulmonary and [place] [phone]   m [phone]    After 3:00PM Pager: [phone]  05/07/2014, 8:35 AM

## 2014-05-08 DIAGNOSIS — J9601 Acute respiratory failure with hypoxia: Secondary | ICD-10-CM

## 2014-05-08 NOTE — Progress Notes (Signed)
   Name: Diane Stevenson MRN: [id number] DOB: December 12, 1929    ADMISSION DATE:  05/14/2014 CONSULTATION DATE:  Lauro Regulus  REFERRING MD :  TRH  CHIEF COMPLAINT:  Coma  BRIEF PATIENT DESCRIPTION: Subdural hematoma, multiple trauma after fall 04/16/14.Admitted then to [facility] and subsequently on PCCM service.  Inoperable per NSGY.  Subsequent status epilepticus, AMS requiring ICU/ intubation. After 5 days off sedatives she had not recovered conciousness.  Family accepted Hospice status. She is now discharged/admitted in-situ to Hospice in-patient status. DNR/DNI.  SIGNIFICANT EVENTS   STUDIES:   HISTORY OF PRESENT ILLNESS:  Diane Stevenson is a 80 y.o. female   has a past medical history of Breast cancer (08/04/1995); Hypertension; Type 2 diabetes mellitus; Dyspareunia; Diabetes mellitus; GERD (gastroesophageal reflux disease); and DJD (degenerative joint disease).   Presented with  Patient came home from church she lost her balance and fell backwards. Hitting the back of her head on the floor and the wall. Patient did not loose consciousness. Patient had a mild headache. She sleped a bit and when she got up had pain in her upper back. Patietn was brought to [facility] ER and CT showed Acute subdural hematoma extending along the entirety of the right interhemispheric fissure and right tentorium. And Acute compression fracture the T1 vertebral body with no retropulsion. Also noted shallow subdural hematoma along the right frontal lobe. The case was discussed at length with Dr. Ellene Route with Neurosurgery who believed patient will not benefit from operative intervention at this point. He agreed to see her in AM but requested Hospitalist to admit.  Work-up revealed SDH, T1/T6/T10 fx, Rt rib fx 04/16/14. Developed hyponatremia and new onset seizures 11/26 and she was transferred to ICU. Required intubation in setting AMS/status epilepticus and rx with propofol. Poor prognosis from neuro standpoint, remained comatose  despite being off propofol, benzo's for >5 days and made DNR on 12/4 and ultimately extubated for full comfort care 12/7. Discharged 05/12/2014 administratively for change to Hospice in-patient service.  SUBJECTIVE: appears comfortable  VITAL SIGNS: Temp:  [97.3 F (36.3 C)-98.4 F (36.9 C)] 97.3 F (36.3 C) (12/14 0500) Pulse Rate:  [86-90] 90 (12/14 0500) Resp:  [19-21] 19 (12/14 0500) BP: (133-136)/(62-78) 133/62 mmHg (12/14 0500) SpO2:  [95 %-97 %] 95 % (12/14 0500)  PHYSICAL EXAMINATION: General:  Passive elderly woman, no evident distress Neuro:  Spontaneous breathing. Grimaces a little to light touch. No spontaneous movemnt HEENT:  Mouth breathing, mucosa not dry Cardiovascular:  RRR, no m, g, r Lungs:  Coarse upper airway rattle, unlabored, not seen to cough Abdomen:  Soft, quiet BS+ Musculoskeletal:  Elderly muscle tone Skin:  No visible rash or breakdown  No results for input(s): NA, K, CL, CO2, BUN, CREATININE, GLUCOSE in the last 168 hours. No results for input(s): HGB, HCT, WBC, PLT in the last 168 hours. No results found.  ASSESSMENT / PLAN:  Coma after subdural hematoma from fall, complicated by seizure. Multiple trauma and medical problems Plan- Full comfort care, DNR/DNI. Prolonged survival not expected. She is admitted to Hospice-inpatient status. Husband coping as well as to be expected - they have been together 56 yrs. Will ask palliative team to take over  [facility]. MD  05/08/2014, 9:33 AM

## 2014-05-08 NOTE — Consult Note (Signed)
Hospital Visit, [place] the patient and spouse, Karena Addison, at the bedside.  The patient was admitted to [place] [facility] on 05/16/2014 as GIP for hospice services in place. ICD10: F81.8E9H, R13.10 The patient continues to be unresponsive with flaccid extremities, and no vocalization. Urine in BSB is tea-colored. O2 at 1.5L/M per [place] in place. PPP. No mottling observed at this time The patient continues with IV Keppra for management of seizures via PIV in R forearm. No indication of pain per PAINAD scale. PPS: 10% Emotional support given to [facility]. [facility] [place], [place], will visit with spouse, Karena Addison, this afternoon. Note: This patient will be a Quarry manager case.   Yetta Glassman RN, [phone]

## 2014-05-08 NOTE — Progress Notes (Signed)
Nutrition Brief Note  Chart reviewed. Patient transitioning to Hospice care.  No nutrition interventions warranted at this time.  Please consult as needed.   Molli Barrows, RD, LDN, [place] Pager [phone] After Hours Pager [phone]

## 2014-05-09 DIAGNOSIS — R40243 Glasgow coma scale score 3-8: Secondary | ICD-10-CM

## 2014-05-09 NOTE — Consult Note (Signed)
Hospital Visit: [place] the patient at bedside. No family present. Patient opened eyes when she was addressed by name. Turned to R side with assistance. Patient grimaced when turned. Skin warm and clammy. No mottling present at this time. Hemodynamically stable. Skin intact. Rectal seal in place. Foley cath in place, draining tea-colored urine. O2 via [place] in place at 1&1/2 L/M Continues to receive Keppra IV and comfort care.  Yetta Glassman RN, [phone]

## 2014-05-09 NOTE — Progress Notes (Addendum)
   Name: Diane Stevenson MRN: [id number] DOB: 1930-02-13    ADMISSION DATE:  05/23/2014 CONSULTATION DATE:  Lauro Regulus  REFERRING MD :  TRH  CHIEF COMPLAINT:  Coma  BRIEF PATIENT DESCRIPTION: Subdural hematoma, multiple trauma after fall 04/16/14.Admitted then to [facility] and subsequently on PCCM service.  Inoperable per NSGY.  Subsequent status epilepticus, AMS requiring ICU/ intubation. After 5 days off sedatives she had not recovered conciousness.  Family accepted Hospice status. She is now discharged/admitted in-situ to Hospice in-patient status. DNR/DNI.  SIGNIFICANT EVENTS   STUDIES:   HISTORY OF PRESENT ILLNESS:  Diane Stevenson is a 80 y.o. female   has a past medical history of Breast cancer (08/04/1995); Hypertension; Type 2 diabetes mellitus; Dyspareunia; Diabetes mellitus; GERD (gastroesophageal reflux disease); and DJD (degenerative joint disease).   Presented with  Patient came home from church she lost her balance and fell backwards. Hitting the back of her head on the floor and the wall. Patient did not loose consciousness. Patient had a mild headache. She sleped a bit and when she got up had pain in her upper back. Patietn was brought to [facility] ER and CT showed Acute subdural hematoma extending along the entirety of the right interhemispheric fissure and right tentorium. And Acute compression fracture the T1 vertebral body with no retropulsion. Also noted shallow subdural hematoma along the right frontal lobe. The case was discussed at length with Dr. Ellene Route with Neurosurgery who believed patient will not benefit from operative intervention at this point. He agreed to see her in AM but requested Hospitalist to admit.  Work-up revealed SDH, T1/T6/T10 fx, Rt rib fx 04/16/14. Developed hyponatremia and new onset seizures 11/26 and she was transferred to ICU. Required intubation in setting AMS/status epilepticus and rx with propofol. Poor prognosis from neuro standpoint, remained comatose  despite being off propofol, benzo's for >5 days and made DNR on 12/4 and ultimately extubated for full comfort care 12/7. Discharged 05/09/2014 administratively for change to Hospice in-patient service.  SUBJECTIVE: appears comfortable, life forces weak  VITAL SIGNS: Temp:  [97 F (36.1 C)-97.2 F (36.2 C)] 97.2 F (36.2 C) (12/15 0502) Pulse Rate:  [78-82] 78 (12/15 0502) Resp:  [19-20] 19 (12/15 0502) BP: (139-141)/(70-78) 139/78 mmHg (12/15 0502) SpO2:  [95 %-96 %] 95 % (12/15 0502)  PHYSICAL EXAMINATION: General:  Passive elderly woman, no evident distress, appears weaker Neuro:  Spontaneous breathing. Grimaces a little to light touch. No spontaneous movemnt HEENT:  Mouth breathing, mucosa not dry Cardiovascular:  RRR, no m, g, r Lungs:  Coarse upper airway rattle, unlabored, not seen to cough Abdomen:  Soft, quiet BS+ Musculoskeletal:  Elderly muscle tone Skin:  No visible rash or breakdown  No results for input(s): NA, K, CL, CO2, BUN, CREATININE, GLUCOSE in the last 168 hours. No results for input(s): HGB, HCT, WBC, PLT in the last 168 hours. No results found.  ASSESSMENT / PLAN:  Coma after subdural hematoma from fall, complicated by seizure. Multiple trauma and medical problems Plan- Full comfort care, DNR/DNI. Prolonged survival not expected. She is admitted to Hospice-inpatient status.    Richardson Landry Minor ACNP Maryanna Shape PCCM Pager [phone] till 3 pm If no answer page [phone]  Comfort goal here Note medical examiner should be informed when she passes since traumatic subdural.  Rigoberto Noel. MD 05/09/2014, 8:53 AM

## 2014-05-10 MED ORDER — MORPHINE SULFATE 25 MG/ML IV SOLN
1.0000 mg/h | INTRAVENOUS | Status: DC
  Administered 2014-05-10: 1 mg/h via INTRAVENOUS
  Filled 2014-05-10: qty 10

## 2014-05-10 MED ORDER — MORPHINE SULFATE 2 MG/ML IJ SOLN
2.0000 mg | Freq: Once | INTRAMUSCULAR | Status: AC
  Administered 2014-05-10: 2 mg via INTRAVENOUS
  Filled 2014-05-10: qty 1

## 2014-05-10 MED ORDER — MORPHINE BOLUS VIA INFUSION
2.0000 mg | INTRAVENOUS | Status: DC | PRN
  Filled 2014-05-10: qty 2

## 2014-05-10 MED ORDER — ATROPINE SULFATE 1 % OP SOLN
2.0000 [drp] | OPHTHALMIC | Status: DC | PRN
  Administered 2014-05-10 – 2014-05-12 (×8): 2 [drp] via SUBLINGUAL
  Filled 2014-05-10 (×2): qty 2

## 2014-05-10 NOTE — Consult Note (Signed)
Hospital Visit: [place] at bedside, spouse, Diane Stevenson present. Patient showing increasing signs of decline. BP trending upward, increased edema, decreased urine output. Respirations @ 32. Appearance now wax-like and very pale.  Discussed observations with spouse, Diane Stevenson, who verbalizes understanding that the patient is in the dying process. Daughter notified of signs of decline. Emotional support given to [facility].  Note: At time of death, please notify [facility] [place], [phone]. This is a medical examiner's case.  Yetta Glassman RN, [phone]

## 2014-05-11 NOTE — Consult Note (Signed)
Hospital Visit: [place] with daughter and daughter in law at bedside. Patient continues to decline. Minimal urine output, febrile, thready pulses, increased secretions. Comfort medications in place  and are effective in managing symptoms. No signs of discomfort noted when the patient was repositioned during visit. Emotional support given to family as well as contact information for hospice.   Yetta Glassman RN, [phone]

## 2014-05-11 NOTE — Progress Notes (Signed)
Patient husband and daughter present at bedside on arrival.  Discussion regarding patient clinical decline since last evening with increased respiratory effort, increase heart rate and B/P decline.  Also noted of significance is the decrease in amount of urine and the increase peripheral edema noted.  Skin is clammy and pale, pulses are weak.  IV Morphine utilized for symptom management has increased over the past 12 hrs.  Discussion regarding continuous infusion of Morphine to achieve maximum symptom management was done and they.  They are in agreement with initiating the infusion.  Comfort and relief of any EOL suffering are the main goals.  They feel very supported by [facility] staff and the staff from [place].   Discussed with Dr. Hilma Favors, and orders received.  Will continue to monitor and adjust symptom management as needed to achieve comfort goals.    Kizzie Fantasia, RN, MSN, [facility] Palliative Integration

## 2014-05-12 MED ORDER — LORAZEPAM 2 MG/ML IJ SOLN
1.0000 mg | Freq: Three times a day (TID) | INTRAMUSCULAR | Status: DC
  Administered 2014-05-12 – 2014-05-13 (×4): 1 mg via INTRAVENOUS
  Filled 2014-05-12 (×4): qty 1

## 2014-05-12 NOTE — Consult Note (Signed)
Hospital Visit: [place] the patient in room. Daughter, Butch Penny, present at bedside. Keppra infusion d/c'd today. Now receiving scheduled Ativan. Continues on Morphine PCA @ 1mg /hr. Urine dark and tea-colored in BSB. No new symptoms noted. Well managed on current regime. Hemodynamically stable at time of visit.  Note: Weekend contact for [facility] [place], [place], [place]  Yetta Glassman RN

## 2014-05-13 DIAGNOSIS — Z515 Encounter for palliative care: Secondary | ICD-10-CM

## 2014-05-26 NOTE — Consult Note (Signed)
Visited pt's bedside today.  DTR, Butch Penny and husband, Karena Addison are present.  BP 61/31, HR 89 on 1 L O2.  Foley intact with small amount of concentrated urine.  Fecal drainage system intact with small amt of liquid brown stool.  DTR feels pt's pulmonary congestion is improved on the Atropine gtt's.  Pt also has a [place] patch behind R ear.  She remains on PCA Morphine and looks very comfortable.  Breathing is regular.  Skin is pale but warm.  No s/s of mottling during this visit today.  Pt's symptoms seem to be optimally managed and family is very pleased with the comfort care pt is receiving at [facility].  Emotional support provided to family.  Discussed some of the symptoms that they may see in pt moving forward.  Answered their questions.  Will continue to follow pt daily as pt's comfort care is sponsored by [place].  Thank you.  Wynetta Fines, BSN [phone]

## 2014-05-26 NOTE — Progress Notes (Signed)
2OO cc from morphine drip bag wasted and witnessed by Edison Simon RN

## 2014-05-26 NOTE — Discharge Summary (Signed)
Death Summary  Diane Stevenson MVV:[id number] DOB: [birth year] DOA: 05-25-2014  PCP: Irven Shelling, MD PCP/Office notified: yes  Admit date: 2014/05/25 Date of Death: 06/13/2014  Final Diagnoses:  Active Problems:   DM2 (diabetes mellitus, type 2)   Thoracic compression fracture   Coma   Traumatic compression fracture of T1 thoracic vertebra   Essential hypertension   Diabetes mellitus type 2, controlled   Acute respiratory failure with hypoxia   Status epilepticus   Comfort measures only status   DNR (do not resuscitate)   Subdural hematoma caused by concussion   History of present illness:  81 yo woman admitted on 04/16/2014 with a subdural hematoma and multiple orthopedic injuries after sustaining a fall on 04/16/14. Subsequent seizure activity and status epilepticus led to intubation for airway protection and an ICU admission. After 5 days in ICU and minimal improvement a decision was made to transition her to full comfort care. Due to status epileticus and severity of her seizures GIP hospice admission was requested for comfort care and symptom management at EOL.  Hospital Course:  Admitted to GIP hospice on 2014/05/25.  Symptoms were managed with IV morphine, IV keppra and IV ativan which all required titration and frequent adjustments. Patient required aggressive, intensive treatment to control pain, seizures and/or symptoms. Care can not feasibly be provided in any other setting.  She expired on 2014/06/01. RN pronounced.  Due to fall patient was a Quarry manager case. Death cert deferred to [place].   Time: 20 minutes  Signed:  GOLDING,ELIZABETH  [facility] 2014/06/13, 2:23 PM

## 2014-05-26 NOTE — Progress Notes (Signed)
At 1809, patient found to be totally unresponsive with no heart beat and respiration, no pulses appreciated and pupils dilated.  Assessment confirmed by Edison Simon ,RN.  Dr. Hilma Favors notified.  Medical examiner Wynona Canes notified.  Unable to reach her daughter Butch Penny but i notified  patient's spouse, Karena Addison.  [employer] [employer] notified and patient was ruled out to be a donor.

## 2014-05-26 NOTE — Progress Notes (Signed)
Pt transfer her body to [place] with all lines intact for medical examiner, given all personal belongings to the family.

## 2014-05-26 NOTE — Progress Notes (Signed)
Palliative Medicine Team Progress Note  Unresponsive. No grimace. Doing well on current infusion. Tolerated change to Ativan. No seizure activity. She is declining today. I removed her nasal cannula O2 as this is not likely contributing to her comfort. Supported family. Will follow along with the hospice team-appreciate their excellent care of this patient. Prognosis: hours.  Lane Hacker, DO Palliative Medicine [phone]

## 2014-05-26 DEATH — deceased

## 2014-06-23 NOTE — Discharge Summary (Signed)
PCCM DEATH SUMMARY  Admit date: 04-19-14 Date of Death: 05-09-2014  Final Cause of Death:  Subdural hematoma  Secondary Causes of Death:   DM2 (diabetes mellitus, type 2)  Thoracic compression fracture  Coma  Traumatic compression fracture of T1 thoracic vertebra  Essential hypertension  Diabetes mellitus type 2, controlled  Acute respiratory failure with hypoxia  Status epilepticus  Comfort measures only status  DNR (do not resuscitate)   Hospital Course 81 yo woman admitted on 04-19-14 with a subdural hematoma and multiple orthopedic injuries after sustaining a fall on 2014/04/19. Subsequent seizure activity and status epilepticus led to intubation for airway protection and an ICU admission. After 5 days in ICU and minimal improvement a decision was made to transition her to full comfort care. She was extubated, started on comfort based medications and discharged to the care of [facility] on 2014-05-09.   Baltazar Apo, MD, PhD 06/23/2014, 6:35 PM [facility] [phone] or if no answer [phone]

## 2016-06-08 IMAGING — CT CT T SPINE W/O CM
2 of 3 series · 10 of 33 positions shown, 12 images · non-contrast
Comparison: [HOSPITAL] neurosurgery thoracic spine radiographs
02/24/2013. Lumbar MRI 01/19/2013. Cervical spine CT 04/16/2014.
COMPARISON: April 16, 2014

ADDENDUM:
This addendum is for the interpretation of the thoracic spine CT at
8285 hr, discovered to be missing report.
CLINICAL DATA: Subdural hematoma from recent fall

EXAM:
CT HEAD WITHOUT CONTRAST
TECHNIQUE: Contiguous axial images were obtained from the base of the skull
through the vertex without intravenous contrast.

[Series 4: t-spine 2.0 i30s 3 · axial · 0.36mm/px · z∈[-154,+112]mm · 7 of 159 slices shown, 9 images]
[im 13/159  soft-tissue]
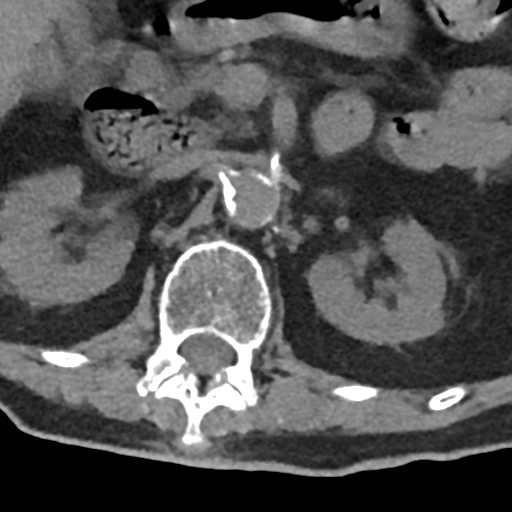
[im 13/159  bone]
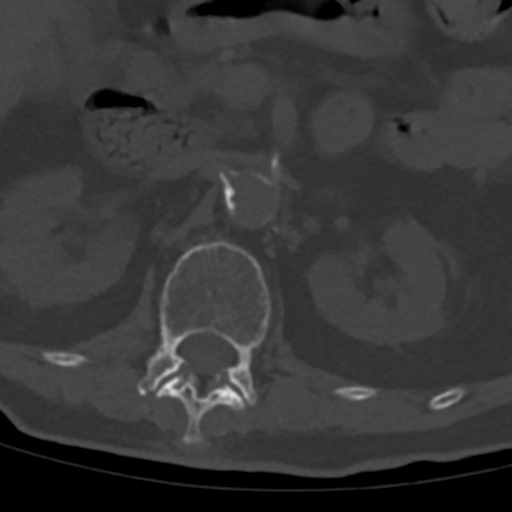
[im 37/159  bone]
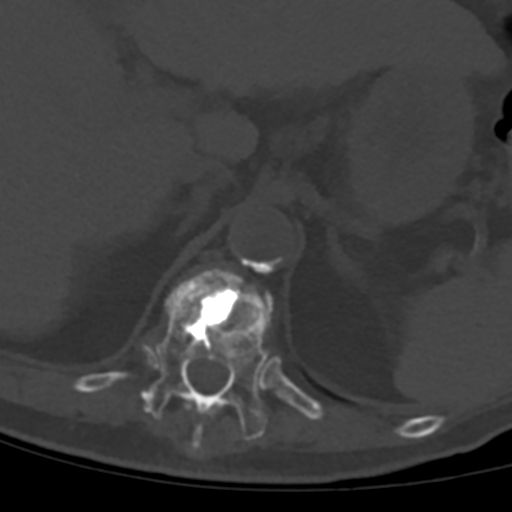
[im 61/159  bone]
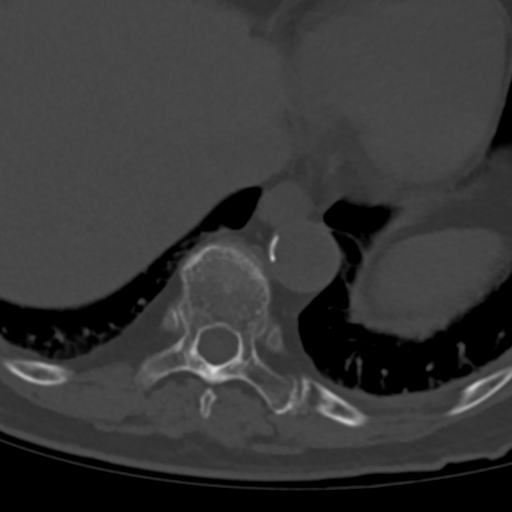
[im 86/159  bone]
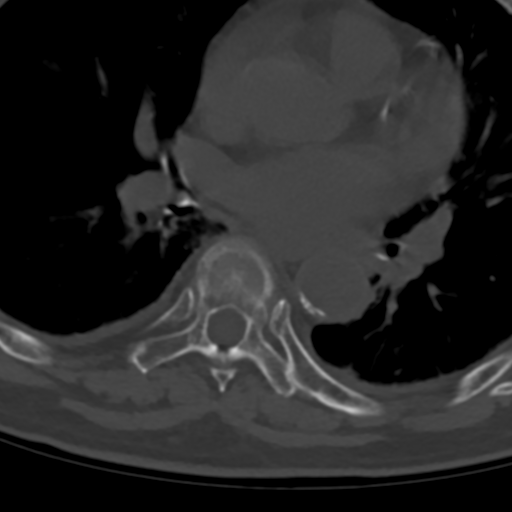
[im 98/159  soft-tissue]
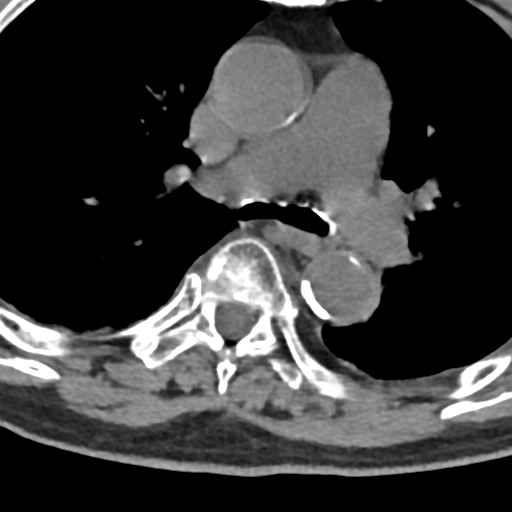
[im 98/159  bone]
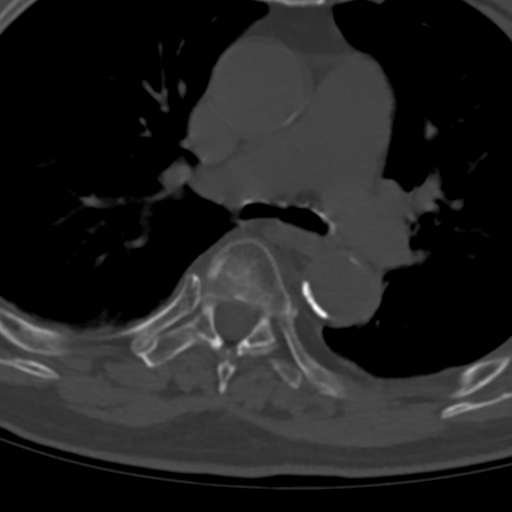
[im 122/159  bone]
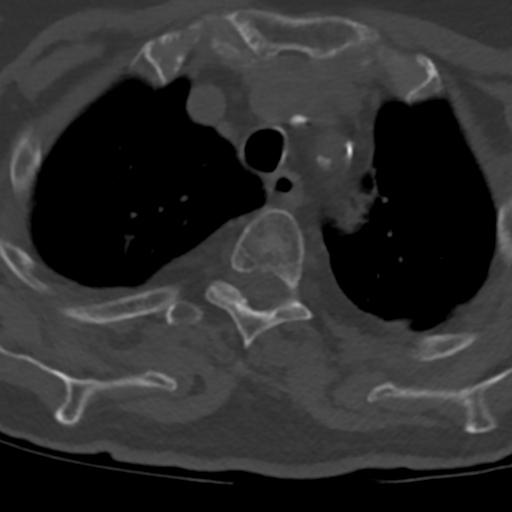
[im 146/159  bone]
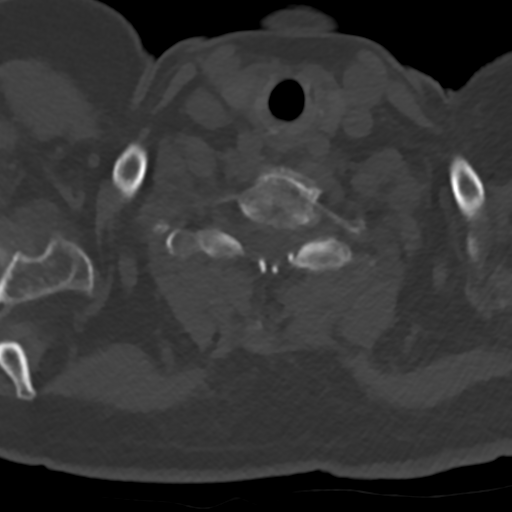

[Series 5: coronal bone · coronal · 0.23mm/px · 3 of 68 slices shown]
[im 14/68  bone]
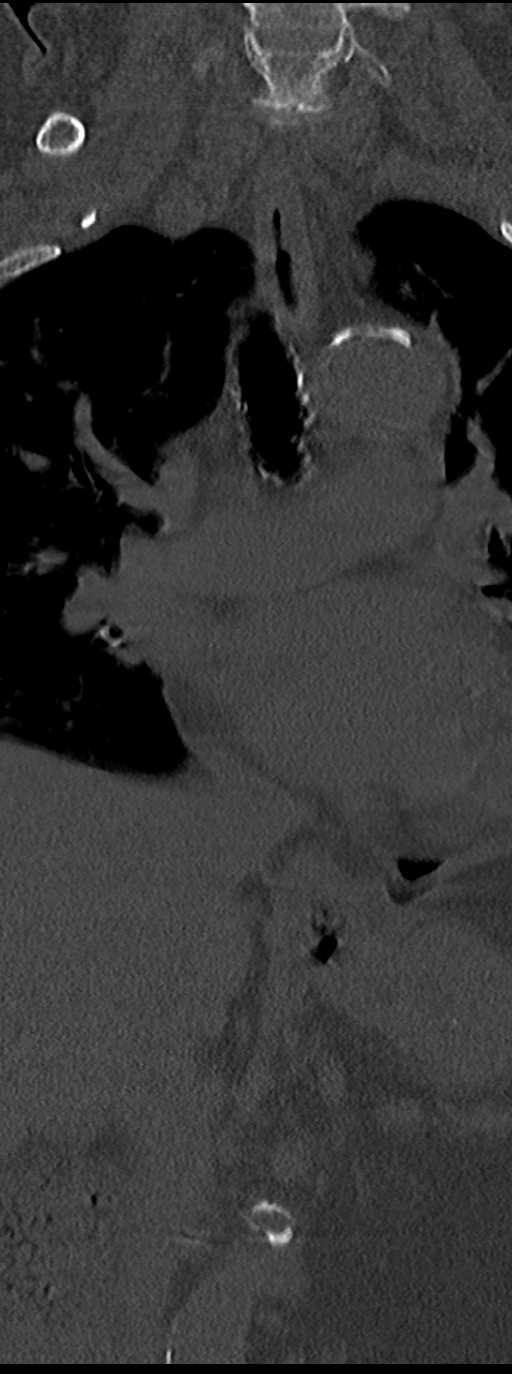
[im 27/68  bone]
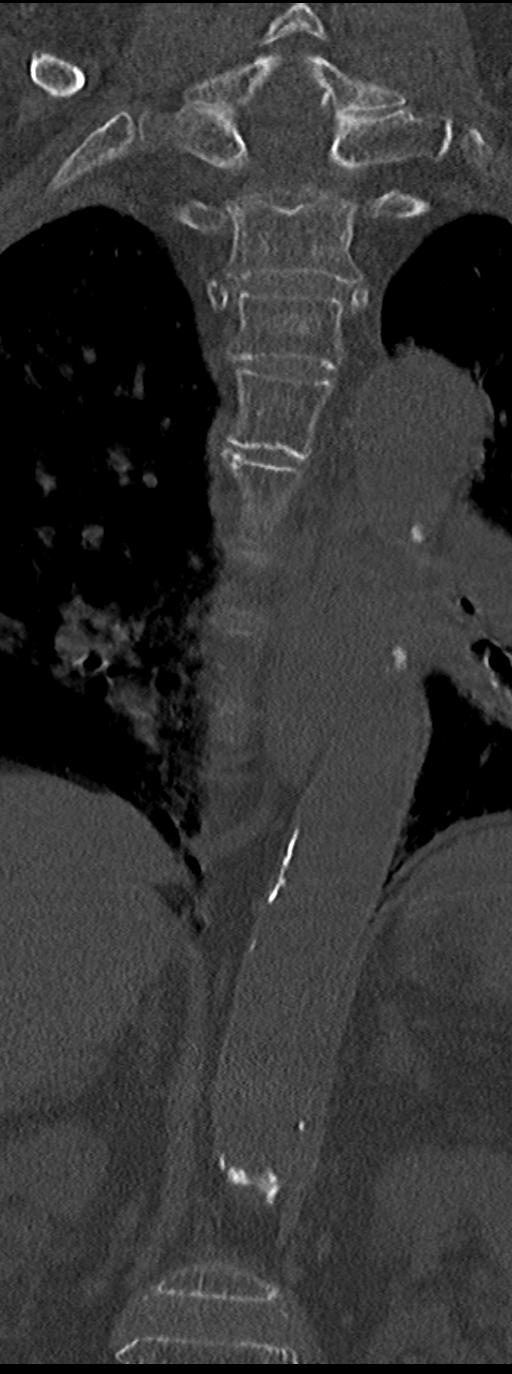
[im 41/68  bone]
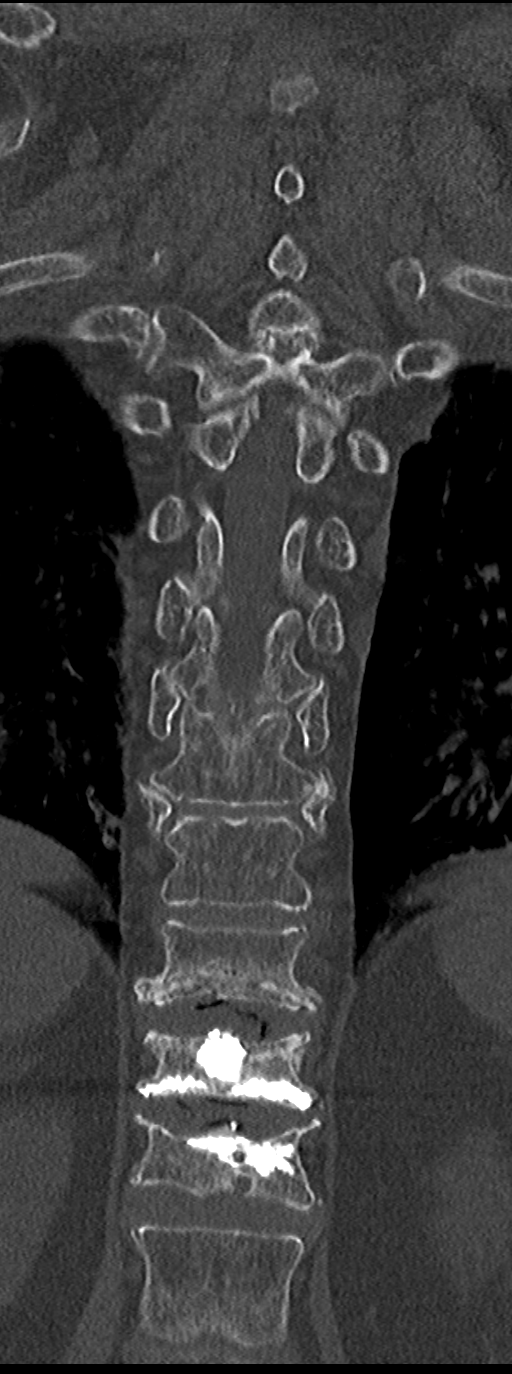

[10 of 33 positions shown; findings below may reference images not displayed]

FINDINGS: Osteopenia.
A chronic severe T12 compression fracture re- identified. That level
and the compressed T11 level are now status post augmentation as
seen in [REDACTED].

Moderate compression of the T1 vertebral body with anterior wedging
appears acute and stable since yesterday.

Moderate T6 compression fracture with acute fracture plane evident
anteriorly (series 3, image 66). Minimal retropulsion of the
posterior inferior endplate. Superimposed posterior element
hypertrophy at that level. AP thecal sac maintained at 11 mm.

At T10 there is an inferior endplate fracture with moderate loss of
vertebral body height. There is associated vacuum phenomena. There
was some loss of height at this level on 02/24/2013. There is
sclerosis developing along the inferior endplate.

However, sagittal images today suggest the possibility of
nondisplaced bilateral T10 pars fractures, more suspicious on the
left (series 6, image 26). On axial images there seems to be non
displaced discontinuity of the right pars on series 3, image 111.

There is multifactorial T10-T11 spinal stenosis with bulky posterior
element hypertrophy and ligament flavum calcification. This appears
exacerbated by T10-T11 disc osteophyte complex.

There is lower thoracic kyphosis exacerbated by the T10 findings.

There is also evidence of a nondisplaced right eleventh rib
costovertebral fracture (series 3, image 121). L1 appears intact.

No other acute rib fracture identified.

Prominent posterior disc protrusions at T7-T8 and T8-T9. Dependent
pulmonary atelectasis. Chronic appearing distal left clavicle
fracture. Calcified atherosclerosis of the aorta and coronary
arteries. Negative visualized non contrast upper abdominal viscera.
CONCLUSION: 1. Acute T1 and T6 compression fractures. No significant
retropulsion of bone.
2. Probably subacute T10 compression fracture, however, suggestion
of acute nondisplaced bilateral T10 pars fractures.
3. Multifactorial T10-T11 spinal stenosis.
4. Follow-up thoracic spine MRI may be valuable.
5. Nondisplaced right eleventh rib fracture.
6. Chronic T11 and T12 compression fracture status post
augmentation.
Preliminary report of this exam discussed by telephone with Dr.
WALTER NICOLAS CREMA on 04/17/2014 at 6903 hrs.
FINDINGS: Moderate diffuse atrophy is stable. The previously noted parafalcine
subdural hematoma extending into the superior medial right tentorium
is again noted. This hematoma has a maximum thickness in the
anterior parafalcine region of 8 mm, stable. No extension of
hematoma is appreciable. The minimal right frontal subdural hematoma
seen on the study 1 day prior may be marginally smaller at this
time. It is best seen currently on axial slice 14 series 3 with a
maximum thickness of 2 mm. There is no mass effect or edema from
this minimal right subdural hematoma. No new subdural or epidural
fluid collections are identified. There is no midline shift. There
is no intra-axial hemorrhage or mass. There is patchy small vessel
disease in the centra semiovale bilaterally, stable. No new
gray-white compartment lesions. The bony calvarium appears intact.
The mastoid air cells are clear. There is mild ethmoid sinus disease
bilaterally. There is minimal mucosal thickening in the left
sphenoid sinus.
IMPRESSION: Stable subdural hematomas without significant mass effect or edema.
No midline shift. No intra-axial hemorrhage. No new extra-axial
fluid. Atrophy with small vessel disease is stable. No new lesion.
